# Patient Record
Sex: Female | Born: 1945 | Race: White | Hispanic: No | Marital: Married | Smoking: Never smoker
Health system: Southern US, Community
[De-identification: ages and names within clinical notes are randomized; demographics above are authoritative.]

## PROBLEM LIST (undated history)

## (undated) DIAGNOSIS — C801 Malignant (primary) neoplasm, unspecified: Secondary | ICD-10-CM

## (undated) DIAGNOSIS — E119 Type 2 diabetes mellitus without complications: Secondary | ICD-10-CM

## (undated) DIAGNOSIS — N289 Disorder of kidney and ureter, unspecified: Secondary | ICD-10-CM

## (undated) DIAGNOSIS — K7460 Unspecified cirrhosis of liver: Secondary | ICD-10-CM

## (undated) DIAGNOSIS — E7800 Pure hypercholesterolemia, unspecified: Secondary | ICD-10-CM

## (undated) DIAGNOSIS — M1990 Unspecified osteoarthritis, unspecified site: Secondary | ICD-10-CM

## (undated) DIAGNOSIS — I10 Essential (primary) hypertension: Secondary | ICD-10-CM

## (undated) DIAGNOSIS — I639 Cerebral infarction, unspecified: Secondary | ICD-10-CM

## (undated) DIAGNOSIS — G459 Transient cerebral ischemic attack, unspecified: Secondary | ICD-10-CM

## (undated) DIAGNOSIS — J189 Pneumonia, unspecified organism: Secondary | ICD-10-CM

## (undated) HISTORY — DX: Pure hypercholesterolemia, unspecified: E78.00

## (undated) HISTORY — DX: Essential (primary) hypertension: I10

## (undated) HISTORY — PX: ABDOMINAL HYSTERECTOMY: SHX81

## (undated) HISTORY — DX: Type 2 diabetes mellitus without complications: E11.9

## (undated) HISTORY — PX: OTHER SURGICAL HISTORY: SHX169

## (undated) HISTORY — PX: BREAST BIOPSY: SHX20

---

## 2007-03-26 HISTORY — PX: OTHER SURGICAL HISTORY: SHX169

## 2016-06-06 DIAGNOSIS — I10 Essential (primary) hypertension: Secondary | ICD-10-CM | POA: Diagnosis not present

## 2016-06-06 DIAGNOSIS — E7800 Pure hypercholesterolemia, unspecified: Secondary | ICD-10-CM | POA: Diagnosis not present

## 2016-06-06 DIAGNOSIS — E119 Type 2 diabetes mellitus without complications: Secondary | ICD-10-CM | POA: Diagnosis not present

## 2016-06-10 DIAGNOSIS — E119 Type 2 diabetes mellitus without complications: Secondary | ICD-10-CM | POA: Diagnosis not present

## 2016-06-10 DIAGNOSIS — I10 Essential (primary) hypertension: Secondary | ICD-10-CM | POA: Diagnosis not present

## 2016-06-25 DIAGNOSIS — H2513 Age-related nuclear cataract, bilateral: Secondary | ICD-10-CM | POA: Diagnosis not present

## 2016-06-25 DIAGNOSIS — H40033 Anatomical narrow angle, bilateral: Secondary | ICD-10-CM | POA: Diagnosis not present

## 2016-09-30 DIAGNOSIS — E7800 Pure hypercholesterolemia, unspecified: Secondary | ICD-10-CM | POA: Diagnosis not present

## 2016-09-30 DIAGNOSIS — E1129 Type 2 diabetes mellitus with other diabetic kidney complication: Secondary | ICD-10-CM | POA: Diagnosis not present

## 2016-09-30 DIAGNOSIS — E1329 Other specified diabetes mellitus with other diabetic kidney complication: Secondary | ICD-10-CM | POA: Diagnosis not present

## 2016-09-30 DIAGNOSIS — E119 Type 2 diabetes mellitus without complications: Secondary | ICD-10-CM | POA: Diagnosis not present

## 2016-09-30 DIAGNOSIS — I10 Essential (primary) hypertension: Secondary | ICD-10-CM | POA: Diagnosis not present

## 2016-10-02 DIAGNOSIS — Z0000 Encounter for general adult medical examination without abnormal findings: Secondary | ICD-10-CM | POA: Diagnosis not present

## 2016-10-02 DIAGNOSIS — R252 Cramp and spasm: Secondary | ICD-10-CM | POA: Diagnosis not present

## 2016-10-02 DIAGNOSIS — I10 Essential (primary) hypertension: Secondary | ICD-10-CM | POA: Diagnosis not present

## 2016-10-02 DIAGNOSIS — E7800 Pure hypercholesterolemia, unspecified: Secondary | ICD-10-CM | POA: Diagnosis not present

## 2016-10-02 DIAGNOSIS — E6609 Other obesity due to excess calories: Secondary | ICD-10-CM | POA: Diagnosis not present

## 2016-10-02 DIAGNOSIS — Z6837 Body mass index (BMI) 37.0-37.9, adult: Secondary | ICD-10-CM | POA: Diagnosis not present

## 2016-10-02 DIAGNOSIS — E119 Type 2 diabetes mellitus without complications: Secondary | ICD-10-CM | POA: Diagnosis not present

## 2017-01-23 DIAGNOSIS — E7800 Pure hypercholesterolemia, unspecified: Secondary | ICD-10-CM | POA: Diagnosis not present

## 2017-01-23 DIAGNOSIS — I10 Essential (primary) hypertension: Secondary | ICD-10-CM | POA: Diagnosis not present

## 2017-01-23 DIAGNOSIS — E1129 Type 2 diabetes mellitus with other diabetic kidney complication: Secondary | ICD-10-CM | POA: Diagnosis not present

## 2017-01-27 DIAGNOSIS — I10 Essential (primary) hypertension: Secondary | ICD-10-CM | POA: Diagnosis not present

## 2017-01-27 DIAGNOSIS — Z6837 Body mass index (BMI) 37.0-37.9, adult: Secondary | ICD-10-CM | POA: Diagnosis not present

## 2017-01-27 DIAGNOSIS — E6609 Other obesity due to excess calories: Secondary | ICD-10-CM | POA: Diagnosis not present

## 2017-01-27 DIAGNOSIS — E119 Type 2 diabetes mellitus without complications: Secondary | ICD-10-CM | POA: Diagnosis not present

## 2017-01-27 DIAGNOSIS — B356 Tinea cruris: Secondary | ICD-10-CM | POA: Diagnosis not present

## 2017-01-27 DIAGNOSIS — E7800 Pure hypercholesterolemia, unspecified: Secondary | ICD-10-CM | POA: Diagnosis not present

## 2017-01-27 DIAGNOSIS — R252 Cramp and spasm: Secondary | ICD-10-CM | POA: Diagnosis not present

## 2017-05-07 DIAGNOSIS — B356 Tinea cruris: Secondary | ICD-10-CM | POA: Diagnosis not present

## 2017-05-07 DIAGNOSIS — Z6837 Body mass index (BMI) 37.0-37.9, adult: Secondary | ICD-10-CM | POA: Diagnosis not present

## 2017-05-07 DIAGNOSIS — N950 Postmenopausal bleeding: Secondary | ICD-10-CM | POA: Diagnosis not present

## 2017-05-15 DIAGNOSIS — N950 Postmenopausal bleeding: Secondary | ICD-10-CM | POA: Diagnosis not present

## 2017-05-15 DIAGNOSIS — N898 Other specified noninflammatory disorders of vagina: Secondary | ICD-10-CM | POA: Diagnosis not present

## 2017-06-04 DIAGNOSIS — E6609 Other obesity due to excess calories: Secondary | ICD-10-CM | POA: Diagnosis not present

## 2017-06-04 DIAGNOSIS — Z6837 Body mass index (BMI) 37.0-37.9, adult: Secondary | ICD-10-CM | POA: Diagnosis not present

## 2017-06-04 DIAGNOSIS — R252 Cramp and spasm: Secondary | ICD-10-CM | POA: Diagnosis not present

## 2017-06-04 DIAGNOSIS — E7800 Pure hypercholesterolemia, unspecified: Secondary | ICD-10-CM | POA: Diagnosis not present

## 2017-06-04 DIAGNOSIS — E119 Type 2 diabetes mellitus without complications: Secondary | ICD-10-CM | POA: Diagnosis not present

## 2017-06-04 DIAGNOSIS — I10 Essential (primary) hypertension: Secondary | ICD-10-CM | POA: Diagnosis not present

## 2017-07-24 DIAGNOSIS — H40033 Anatomical narrow angle, bilateral: Secondary | ICD-10-CM | POA: Diagnosis not present

## 2017-07-24 DIAGNOSIS — H2513 Age-related nuclear cataract, bilateral: Secondary | ICD-10-CM | POA: Diagnosis not present

## 2017-08-01 DIAGNOSIS — Z6838 Body mass index (BMI) 38.0-38.9, adult: Secondary | ICD-10-CM | POA: Diagnosis not present

## 2017-08-01 DIAGNOSIS — R05 Cough: Secondary | ICD-10-CM | POA: Diagnosis not present

## 2017-08-14 DIAGNOSIS — R05 Cough: Secondary | ICD-10-CM | POA: Diagnosis not present

## 2017-08-14 DIAGNOSIS — Z6838 Body mass index (BMI) 38.0-38.9, adult: Secondary | ICD-10-CM | POA: Diagnosis not present

## 2017-10-07 DIAGNOSIS — I10 Essential (primary) hypertension: Secondary | ICD-10-CM | POA: Diagnosis not present

## 2017-10-07 DIAGNOSIS — E7801 Familial hypercholesterolemia: Secondary | ICD-10-CM | POA: Diagnosis not present

## 2017-10-07 DIAGNOSIS — E1129 Type 2 diabetes mellitus with other diabetic kidney complication: Secondary | ICD-10-CM | POA: Diagnosis not present

## 2017-10-09 DIAGNOSIS — R252 Cramp and spasm: Secondary | ICD-10-CM | POA: Diagnosis not present

## 2017-10-09 DIAGNOSIS — I10 Essential (primary) hypertension: Secondary | ICD-10-CM | POA: Diagnosis not present

## 2017-10-09 DIAGNOSIS — R945 Abnormal results of liver function studies: Secondary | ICD-10-CM | POA: Diagnosis not present

## 2017-10-09 DIAGNOSIS — Z1331 Encounter for screening for depression: Secondary | ICD-10-CM | POA: Diagnosis not present

## 2017-10-09 DIAGNOSIS — Z6837 Body mass index (BMI) 37.0-37.9, adult: Secondary | ICD-10-CM | POA: Diagnosis not present

## 2017-10-09 DIAGNOSIS — Z1389 Encounter for screening for other disorder: Secondary | ICD-10-CM | POA: Diagnosis not present

## 2017-10-09 DIAGNOSIS — E119 Type 2 diabetes mellitus without complications: Secondary | ICD-10-CM | POA: Diagnosis not present

## 2018-02-05 DIAGNOSIS — E7801 Familial hypercholesterolemia: Secondary | ICD-10-CM | POA: Diagnosis not present

## 2018-02-05 DIAGNOSIS — E7800 Pure hypercholesterolemia, unspecified: Secondary | ICD-10-CM | POA: Diagnosis not present

## 2018-02-05 DIAGNOSIS — I10 Essential (primary) hypertension: Secondary | ICD-10-CM | POA: Diagnosis not present

## 2018-02-05 DIAGNOSIS — E1329 Other specified diabetes mellitus with other diabetic kidney complication: Secondary | ICD-10-CM | POA: Diagnosis not present

## 2018-02-05 DIAGNOSIS — R945 Abnormal results of liver function studies: Secondary | ICD-10-CM | POA: Diagnosis not present

## 2018-02-09 DIAGNOSIS — R945 Abnormal results of liver function studies: Secondary | ICD-10-CM | POA: Diagnosis not present

## 2018-02-09 DIAGNOSIS — E7800 Pure hypercholesterolemia, unspecified: Secondary | ICD-10-CM | POA: Diagnosis not present

## 2018-02-09 DIAGNOSIS — J069 Acute upper respiratory infection, unspecified: Secondary | ICD-10-CM | POA: Diagnosis not present

## 2018-02-09 DIAGNOSIS — R252 Cramp and spasm: Secondary | ICD-10-CM | POA: Diagnosis not present

## 2018-02-09 DIAGNOSIS — E119 Type 2 diabetes mellitus without complications: Secondary | ICD-10-CM | POA: Diagnosis not present

## 2018-02-09 DIAGNOSIS — I10 Essential (primary) hypertension: Secondary | ICD-10-CM | POA: Diagnosis not present

## 2018-02-09 DIAGNOSIS — Z6837 Body mass index (BMI) 37.0-37.9, adult: Secondary | ICD-10-CM | POA: Diagnosis not present

## 2018-02-09 DIAGNOSIS — R609 Edema, unspecified: Secondary | ICD-10-CM | POA: Diagnosis not present

## 2018-04-08 DIAGNOSIS — C541 Malignant neoplasm of endometrium: Secondary | ICD-10-CM | POA: Diagnosis not present

## 2018-04-08 DIAGNOSIS — N950 Postmenopausal bleeding: Secondary | ICD-10-CM | POA: Diagnosis not present

## 2018-04-17 DIAGNOSIS — R9389 Abnormal findings on diagnostic imaging of other specified body structures: Secondary | ICD-10-CM | POA: Diagnosis not present

## 2018-04-17 DIAGNOSIS — N950 Postmenopausal bleeding: Secondary | ICD-10-CM | POA: Diagnosis not present

## 2018-04-17 DIAGNOSIS — C541 Malignant neoplasm of endometrium: Secondary | ICD-10-CM | POA: Diagnosis not present

## 2018-04-20 ENCOUNTER — Telehealth: Payer: Self-pay | Admitting: *Deleted

## 2018-04-20 NOTE — Progress Notes (Signed)
Updated med list and history per dr office.

## 2018-04-20 NOTE — Telephone Encounter (Signed)
Called and spoke with the patient regarding a new patient appt. Patient scheduled for this Friday at 9:30am with Dr. Denman George

## 2018-04-22 DIAGNOSIS — R9389 Abnormal findings on diagnostic imaging of other specified body structures: Secondary | ICD-10-CM | POA: Diagnosis not present

## 2018-04-22 DIAGNOSIS — K7460 Unspecified cirrhosis of liver: Secondary | ICD-10-CM | POA: Diagnosis not present

## 2018-04-22 DIAGNOSIS — K8020 Calculus of gallbladder without cholecystitis without obstruction: Secondary | ICD-10-CM | POA: Diagnosis not present

## 2018-04-22 DIAGNOSIS — I517 Cardiomegaly: Secondary | ICD-10-CM | POA: Diagnosis not present

## 2018-04-22 DIAGNOSIS — C541 Malignant neoplasm of endometrium: Secondary | ICD-10-CM | POA: Diagnosis not present

## 2018-04-22 DIAGNOSIS — I700 Atherosclerosis of aorta: Secondary | ICD-10-CM | POA: Diagnosis not present

## 2018-04-23 ENCOUNTER — Telehealth: Payer: Self-pay | Admitting: *Deleted

## 2018-04-23 DIAGNOSIS — Z6837 Body mass index (BMI) 37.0-37.9, adult: Secondary | ICD-10-CM | POA: Diagnosis not present

## 2018-04-23 DIAGNOSIS — J111 Influenza due to unidentified influenza virus with other respiratory manifestations: Secondary | ICD-10-CM | POA: Diagnosis not present

## 2018-04-23 NOTE — Telephone Encounter (Signed)
Returned the patient call and reschedule her appt from tomorrow 1/31 to 2/4. Patient is sick

## 2018-04-24 ENCOUNTER — Inpatient Hospital Stay: Payer: Self-pay | Admitting: Gynecologic Oncology

## 2018-04-28 ENCOUNTER — Inpatient Hospital Stay: Payer: PPO | Attending: Gynecologic Oncology | Admitting: Gynecologic Oncology

## 2018-04-28 ENCOUNTER — Encounter: Payer: Self-pay | Admitting: Gynecologic Oncology

## 2018-04-28 VITALS — BP 143/65 | HR 76 | Temp 98.1°F | Resp 18 | Ht 63.0 in | Wt 209.0 lb

## 2018-04-28 DIAGNOSIS — C541 Malignant neoplasm of endometrium: Secondary | ICD-10-CM

## 2018-04-28 NOTE — H&P (View-Only) (Signed)
Consult Note: Gyn-Onc  Consult was requested by Dr. Adah Perl for the evaluation of [name] 73 y.o. female  CC:  Chief Complaint  Patient presents with  . Endometrial cancer [facility])    Assessment/Plan:  Ms. COUNTESS BIEBEL  is a 73 y.o.  year old with grade 1 endometrioid endometrial cancer and obesity (BMI 37).  A detailed discussion was held with the patient and her family with regard to to her endometrial cancer diagnosis. We discussed the standard management options for uterine cancer which includes surgery followed possibly by adjuvant therapy depending on the results of surgery. The options for surgical management include a hysterectomy and removal of the tubes and ovaries possibly with removal of pelvic and para-aortic lymph nodes.If feasible, a minimally invasive approach including a robotic hysterectomy or laparoscopic hysterectomy have benefits including shorter hospital stay, recovery time and better wound healing than with open surgery. The patient has been counseled about these surgical options and the risks of surgery in general including infection, bleeding, damage to surrounding structures (including bowel, bladder, ureters, nerves or vessels), and the postoperative risks of PE/ DVT, and lymphedema. I extensively reviewed the additional risks of robotic hysterectomy including possible need for conversion to open laparotomy.  I discussed positioning during surgery of trendelenberg and risks of minor facial swelling and care we take in preoperative positioning.  After counseling and consideration of her options, she desires to proceed with robotic assisted total hysterectomy with bilateral sapingo-oophorectomy and SLN biopsy.   She will be seen by anesthesia for preoperative clearance and discussion of postoperative pain management.  She was given the opportunity to ask questions, which were answered to her satisfaction, and she is agreement with the above mentioned plan of  care.  She will stop her ASA preoperatively. Surgery is scheduled for approximately 10 days.   HPI: Ms Marketta Valadez is a 73 year old P2 who is seen in consultation at the request of Dr Adah Perl for a grade I endometrial cancer.  The patient reports that she developed postmenopausal bleeding in the spring 2019.  She was seen and evaluated by Dr. Adah Perl who upon vaginal examination felt it was bleeding from her vaginal lesion.  The bleeding got better but then again became worse in December 2019 and she she was reevaluated by Dr. Adah Perl.  At that time a transvaginal ultrasound scan was performed in the office on April 17, 2018.  This revealed a uterus measuring 10.2 x 4.9 x 6.2 cm.  The endometrium was thickened at 26 mm.  There were no focal abnormalities otherwise noted.  The right and left ovaries were grossly normal.  She then underwent an endometrial biopsy on 04/09/18 which revealed an endometrioid adenocarcinoma FIGO grade 1.  The patient reports she had CT imaging performed at [facility] in [place] however we do not have the report at this time for this.  She was reported that it was normal.  The patient has a past medical history significant for obesity (BMI 37), hypertension, hypercholesterolemia, and type 2 diabetes mellitus on metformin.  Her only prior surgery was a hysteroscopy 11 years ago.  She has a family history significant for mother with lung cancer.  Her diabetes is fairly well controlled as her last hemoglobin A1c in November 2019 was 5.9%.  It is managed by her primary care doctor Dr. Consuello Masse.   Current Meds:  Outpatient Encounter Medications as of 04/28/2018  Medication Sig  . aspirin EC 81 MG tablet Take 81  mg by mouth daily.  Marland Kitchen guaiFENesin (MUCINEX) 600 MG 12 hr tablet Take 600 mg by mouth 2 (two) times daily.  Marland Kitchen losartan (COZAAR) 25 MG tablet Take 25 mg by mouth daily.  . metFORMIN (GLUCOPHAGE-XR) 500 MG 24 hr tablet Take 500 mg by mouth daily with breakfast.  .  simvastatin (ZOCOR) 20 MG tablet Take 20 mg by mouth daily.   No facility-administered encounter medications on file as of 04/28/2018.     Allergy: No Known Allergies  Social Hx:   Social History   Socioeconomic History  . Marital status: Married    Spouse name: Not on file  . Number of children: Not on file  . Years of education: Not on file  . Highest education level: Not on file  Occupational History  . Not on file  Social Needs  . Financial resource strain: Not on file  . Food insecurity:    Worry: Not on file    Inability: Not on file  . Transportation needs:    Medical: Not on file    Non-medical: Not on file  Tobacco Use  . Smoking status: Never Smoker  . Smokeless tobacco: Never Used  Substance and Sexual Activity  . Alcohol use: Never    Frequency: Never  . Drug use: Never  . Sexual activity: Not Currently  Lifestyle  . Physical activity:    Days per week: Not on file    Minutes per session: Not on file  . Stress: Not on file  Relationships  . Social connections:    Talks on phone: Not on file    Gets together: Not on file    Attends religious service: Not on file    Active member of club or organization: Not on file    Attends meetings of clubs or organizations: Not on file    Relationship status: Not on file  . Intimate partner violence:    Fear of current or ex partner: Not on file    Emotionally abused: Not on file    Physically abused: Not on file    Forced sexual activity: Not on file  Other Topics Concern  . Not on file  Social History Narrative  . Not on file    Past Surgical Hx:  Past Surgical History:  Procedure Laterality Date  . BREAST BIOPSY Right    ~15 years ago-2005  . OTHER SURGICAL HISTORY  2009   Uterine Polyp removal    Past Medical Hx:  Past Medical History:  Diagnosis Date  . Diabetes mellitus without complication ([place])   . High cholesterol   . Hypertension     Past Gynecological History:  See HPI No LMP  recorded.  Family Hx:  Family History  Problem Relation Age of Onset  . Lung cancer Mother   . Hypertension Father   . Pancreatic cancer Brother     Review of Systems:  Constitutional  Feels well,    ENT Normal appearing ears and nares bilaterally Skin/Breast  No rash, sores, jaundice, itching, dryness Cardiovascular  No chest pain, shortness of breath, or edema  Pulmonary  No cough or wheeze.  Gastro Intestinal  No nausea, vomitting, or diarrhoea. No bright red blood per rectum, no abdominal pain, change in bowel movement, or constipation.  Genito Urinary  No frequency, urgency, dysuria, + postmenopausal bleeding Musculo Skeletal  No myalgia, arthralgia, joint swelling or pain  Neurologic  No weakness, numbness, change in gait,  Psychology  No depression, anxiety, insomnia.  Vitals:  Blood pressure (!) 143/65, pulse 76, temperature 98.1 F (36.7 C), resp. rate 18, height 5\' 3"  (1.6 m), weight 209 lb (94.8 kg), SpO2 96 %.  Physical Exam: WD in NAD Neck  Supple NROM, without any enlargements.  Lymph Node Survey No cervical supraclavicular or inguinal adenopathy Cardiovascular  Pulse normal rate, regularity and rhythm. S1 and S2 normal.  Lungs  Clear to auscultation bilateraly, without wheezes/crackles/rhonchi. Good air movement.  Skin  No rash/lesions/breakdown  Psychiatry  Alert and oriented to person, place, and time  Abdomen  Normoactive bowel sounds, abdomen soft, non-tender and obese without evidence of hernia.  Back No CVA tenderness Genito Urinary  Vulva/vagina: Normal external female genitalia.   No lesions. No discharge or bleeding.  Bladder/urethra:  No lesions or masses, well supported bladder  Vagina: grossly normal  Cervix: Normal appearing, no lesions.  Uterus:  Small, mobile, no parametrial involvement or nodularity.  Adnexa: no palpable masses. Rectal  deferred Extremities  No bilateral cyanosis, clubbing or edema.   Thereasa Solo, MD   04/28/2018, 5:19 PM

## 2018-04-28 NOTE — Progress Notes (Signed)
Consult Note: Gyn-Onc  Consult was requested by Dr. Adah Perl for the evaluation of Jordan Le 73 y.o. female  CC:  Chief Complaint  Patient presents with  . Endometrial cancer [facility])    Assessment/Plan:  Ms. Jordan Le  is a 73 y.o.  year old with grade 1 endometrioid endometrial cancer and obesity (BMI 37).  A detailed discussion was held with the patient and her family with regard to to her endometrial cancer diagnosis. We discussed the standard management options for uterine cancer which includes surgery followed possibly by adjuvant therapy depending on the results of surgery. The options for surgical management include a hysterectomy and removal of the tubes and ovaries possibly with removal of pelvic and para-aortic lymph nodes.If feasible, a minimally invasive approach including a robotic hysterectomy or laparoscopic hysterectomy have benefits including shorter hospital stay, recovery time and better wound healing than with open surgery. The patient has been counseled about these surgical options and the risks of surgery in general including infection, bleeding, damage to surrounding structures (including bowel, bladder, ureters, nerves or vessels), and the postoperative risks of PE/ DVT, and lymphedema. I extensively reviewed the additional risks of robotic hysterectomy including possible need for conversion to open laparotomy.  I discussed positioning during surgery of trendelenberg and risks of minor facial swelling and care we take in preoperative positioning.  After counseling and consideration of her options, she desires to proceed with robotic assisted total hysterectomy with bilateral sapingo-oophorectomy and SLN biopsy.   She will be seen by anesthesia for preoperative clearance and discussion of postoperative pain management.  She was given the opportunity to ask questions, which were answered to her satisfaction, and she is agreement with the above mentioned plan of  care.  She will stop her ASA preoperatively. Surgery is scheduled for approximately 10 days.   HPI: Ms Jordan Le is a 73 year old P2 who is seen in consultation at the request of Dr Adah Perl for a grade I endometrial cancer.  The patient reports that she developed postmenopausal bleeding in the spring 2019.  She was seen and evaluated by Dr. Adah Perl who upon vaginal examination felt it was bleeding from her vaginal lesion.  The bleeding got better but then again became worse in December 2019 and she she was reevaluated by Dr. Adah Perl.  At that time a transvaginal ultrasound scan was performed in the office on April 17, 2018.  This revealed a uterus measuring 10.2 x 4.9 x 6.2 cm.  The endometrium was thickened at 26 mm.  There were no focal abnormalities otherwise noted.  The right and left ovaries were grossly normal.  She then underwent an endometrial biopsy on 04/09/18 which revealed an endometrioid adenocarcinoma FIGO grade 1.  The patient reports she had CT imaging performed at [facility] in [place] however we do not have the report at this time for this.  She was reported that it was normal.  The patient has a past medical history significant for obesity (BMI 37), hypertension, hypercholesterolemia, and type 2 diabetes mellitus on metformin.  Her only prior surgery was a hysteroscopy 11 years ago.  She has a family history significant for mother with lung cancer.  Her diabetes is fairly well controlled as her last hemoglobin A1c in November 2019 was 5.9%.  It is managed by her primary care doctor Dr. Consuello Masse.   Current Meds:  Outpatient Encounter Medications as of 04/28/2018  Medication Sig  . aspirin EC 81 MG tablet Take 81  mg by mouth daily.  Marland Kitchen guaiFENesin (MUCINEX) 600 MG 12 hr tablet Take 600 mg by mouth 2 (two) times daily.  Marland Kitchen losartan (COZAAR) 25 MG tablet Take 25 mg by mouth daily.  . metFORMIN (GLUCOPHAGE-XR) 500 MG 24 hr tablet Take 500 mg by mouth daily with breakfast.  .  simvastatin (ZOCOR) 20 MG tablet Take 20 mg by mouth daily.   No facility-administered encounter medications on file as of 04/28/2018.     Allergy: No Known Allergies  Social Hx:   Social History   Socioeconomic History  . Marital status: Married    Spouse name: Not on file  . Number of children: Not on file  . Years of education: Not on file  . Highest education level: Not on file  Occupational History  . Not on file  Social Needs  . Financial resource strain: Not on file  . Food insecurity:    Worry: Not on file    Inability: Not on file  . Transportation needs:    Medical: Not on file    Non-medical: Not on file  Tobacco Use  . Smoking status: Never Smoker  . Smokeless tobacco: Never Used  Substance and Sexual Activity  . Alcohol use: Never    Frequency: Never  . Drug use: Never  . Sexual activity: Not Currently  Lifestyle  . Physical activity:    Days per week: Not on file    Minutes per session: Not on file  . Stress: Not on file  Relationships  . Social connections:    Talks on phone: Not on file    Gets together: Not on file    Attends religious service: Not on file    Active member of club or organization: Not on file    Attends meetings of clubs or organizations: Not on file    Relationship status: Not on file  . Intimate partner violence:    Fear of current or ex partner: Not on file    Emotionally abused: Not on file    Physically abused: Not on file    Forced sexual activity: Not on file  Other Topics Concern  . Not on file  Social History Narrative  . Not on file    Past Surgical Hx:  Past Surgical History:  Procedure Laterality Date  . BREAST BIOPSY Right    ~15 years ago-2005  . OTHER SURGICAL HISTORY  2009   Uterine Polyp removal    Past Medical Hx:  Past Medical History:  Diagnosis Date  . Diabetes mellitus without complication ([place])   . High cholesterol   . Hypertension     Past Gynecological History:  See HPI No LMP  recorded.  Family Hx:  Family History  Problem Relation Age of Onset  . Lung cancer Mother   . Hypertension Father   . Pancreatic cancer Brother     Review of Systems:  Constitutional  Feels well,    ENT Normal appearing ears and nares bilaterally Skin/Breast  No rash, sores, jaundice, itching, dryness Cardiovascular  No chest pain, shortness of breath, or edema  Pulmonary  No cough or wheeze.  Gastro Intestinal  No nausea, vomitting, or diarrhoea. No bright red blood per rectum, no abdominal pain, change in bowel movement, or constipation.  Genito Urinary  No frequency, urgency, dysuria, + postmenopausal bleeding Musculo Skeletal  No myalgia, arthralgia, joint swelling or pain  Neurologic  No weakness, numbness, change in gait,  Psychology  No depression, anxiety, insomnia.  Vitals:  Blood pressure (!) 143/65, pulse 76, temperature 98.1 F (36.7 C), resp. rate 18, height 5\' 3"  (1.6 m), weight 209 lb (94.8 kg), SpO2 96 %.  Physical Exam: WD in NAD Neck  Supple NROM, without any enlargements.  Lymph Node Survey No cervical supraclavicular or inguinal adenopathy Cardiovascular  Pulse normal rate, regularity and rhythm. S1 and S2 normal.  Lungs  Clear to auscultation bilateraly, without wheezes/crackles/rhonchi. Good air movement.  Skin  No rash/lesions/breakdown  Psychiatry  Alert and oriented to person, place, and time  Abdomen  Normoactive bowel sounds, abdomen soft, non-tender and obese without evidence of hernia.  Back No CVA tenderness Genito Urinary  Vulva/vagina: Normal external female genitalia.   No lesions. No discharge or bleeding.  Bladder/urethra:  No lesions or masses, well supported bladder  Vagina: grossly normal  Cervix: Normal appearing, no lesions.  Uterus:  Small, mobile, no parametrial involvement or nodularity.  Adnexa: no palpable masses. Rectal  deferred Extremities  No bilateral cyanosis, clubbing or edema.   Thereasa Solo, MD   04/28/2018, 5:19 PM

## 2018-04-28 NOTE — Patient Instructions (Signed)
Preparing for your Surgery  Plan for surgery on May 07, 2018 with Dr. Everitt Amber at [facility] will be scheduled for a robotic assisted total hysterectomy, bilateral salpingo-oophorectomy, sentinel lymph node biopsy.   Pre-operative Testing -You will receive a phone call from presurgical testing at [facility] to arrange for a pre-operative testing appointment before your surgery.  This appointment normally occurs one to two weeks before your scheduled surgery.   -Bring your insurance card, copy of an advanced directive if applicable, medication list  -At that visit, you will be asked to sign a consent for a possible blood transfusion in case a transfusion becomes necessary during surgery.  The need for a blood transfusion is rare but having consent is a necessary part of your care.     -STOP ASPIRIN NOW. You should not be taking blood thinners or aspirin at least ten days prior to surgery unless instructed by your surgeon.  Day Before Surgery at [place] will be asked to take in a light diet the day before surgery.  Avoid carbonated beverages.  You will be advised to have nothing to eat or drink after midnight the evening before.    Eat a light diet the day before surgery.  Examples including soups, broths, toast, yogurt, mashed potatoes.  Things to avoid include carbonated beverages (fizzy beverages), raw fruits and raw vegetables, or beans.   If your bowels are filled with gas, your surgeon will have difficulty visualizing your pelvic organs which increases your surgical risks.  Your role in recovery Your role is to become active as soon as directed by your doctor, while still giving yourself time to heal.  Rest when you feel tired. You will be asked to do the following in order to speed your recovery:  - Cough and breathe deeply. This helps toclear and expand your lungs and can prevent pneumonia. You may be given a spirometer to practice deep  breathing. A staff member will show you how to use the spirometer. - Do mild physical activity. Walking or moving your legs help your circulation and body functions return to normal. A staff member will help you when you try to walk and will provide you with simple exercises. Do not try to get up or walk alone the first time. - Actively manage your pain. Managing your pain lets you move in comfort. We will ask you to rate your pain on a scale of zero to 10. It is your responsibility to tell your doctor or nurse where and how much you hurt so your pain can be treated.  Special Considerations -If you are diabetic, you may be placed on insulin after surgery to have closer control over your blood sugars to promote healing and recovery.  This does not mean that you will be discharged on insulin.  If applicable, your oral antidiabetics will be resumed when you are tolerating a solid diet.  -Your final pathology results from surgery should be available around one week after surgery and the results will be relayed to you when available.  -Dr. Lahoma Crocker is the Surgeon that assists your GYN Oncologist with surgery.  The next day after your surgery you will either see Dr. Everitt Amber or Dr. Lahoma Crocker.  -FMLA forms can be faxed to [phone] and please allow 5-7 business days for completion.   Blood Transfusion Information WHAT IS A BLOOD TRANSFUSION? A transfusion is the replacement of blood or some of its parts. Blood is made  up of multiple cells which provide different functions.  Red blood cells carry oxygen and are used for blood loss replacement.  White blood cells fight against infection.  Platelets control bleeding.  Plasma helps clot blood.  Other blood products are available for specialized needs, such as hemophilia or other clotting disorders. BEFORE THE TRANSFUSION  Who gives blood for transfusions?   You may be able to donate blood to be used at a later date on  yourself (autologous donation).  Relatives can be asked to donate blood. This is generally not any safer than if you have received blood from a stranger. The same precautions are taken to ensure safety when a relative's blood is donated.  Healthy volunteers who are fully evaluated to make sure their blood is safe. This is blood bank blood. Transfusion therapy is the safest it has ever been in the practice of medicine. Before blood is taken from a donor, a complete history is taken to make sure that person has no history of diseases nor engages in risky social behavior (examples are intravenous drug use or sexual activity with multiple partners). The donor's travel history is screened to minimize risk of transmitting infections, such as malaria. The donated blood is tested for signs of infectious diseases, such as HIV and hepatitis. The blood is then tested to be sure it is compatible with you in order to minimize the chance of a transfusion reaction. If you or a relative donates blood, this is often done in anticipation of surgery and is not appropriate for emergency situations. It takes many days to process the donated blood. RISKS AND COMPLICATIONS Although transfusion therapy is very safe and saves many lives, the main dangers of transfusion include:   Getting an infectious disease.  Developing a transfusion reaction. This is an allergic reaction to something in the blood you were given. Every precaution is taken to prevent this. The decision to have a blood transfusion has been considered carefully by your caregiver before blood is given. Blood is not given unless the benefits outweigh the risks.

## 2018-05-04 NOTE — Patient Instructions (Signed)
Jordan Le  05/04/2018   Your procedure is scheduled on: 05-07-18   Report to [facility] Main  Entrance              Report to admitting at                  0800 AM    Call this number if you have problems the morning of surgery [phone]    Remember: Eat a light diet the day before surgery.  Examples including soups, broths, toast, yogurt, mashed potatoes.  Things to avoid include carbonated beverages (fizzy beverages), raw fruits and raw vegetables, or beans.   If your bowels are filled with gas, your surgeon will have difficulty visualizing your pelvic organs which increases your surgical risks. Do not eat food or drink liquids :After Midnight.   BRUSH YOUR TEETH MORNING OF SURGERY AND RINSE YOUR MOUTH OUT, NO CHEWING GUM CANDY OR MINTS.     Take these medicines the morning of surgery with A SIP OF WATER: none DO NOT TAKE ANY DIABETIC MEDICATIONS DAY OF YOUR SURGERY                               You may not have any metal on your body including hair pins and              piercings  Do not wear jewelry, make-up, lotions, powders or perfumes, deodorant             Do not wear nail polish.  Do not shave  48 hours prior to surgery.         Do not bring valuables to the hospital. [place].  Contacts, dentures or bridgework may not be worn into surgery.      Patients discharged the day of surgery will not be allowed to drive home. IF YOU ARE HAVING SURGERY AND GOING HOME THE SAME DAY, YOU MUST HAVE AN ADULT TO DRIVE YOU HOME AND BE WITH YOU FOR 24 HOURS. YOU MAY GO HOME BY TAXI OR UBER OR ORTHERWISE, BUT AN ADULT MUST ACCOMPANY YOU HOME AND STAY WITH YOU FOR 24 HOURS.  Name and phone number of your driver:  Special Instructions: N/A              Please read over the following fact sheets you were given: _____________________________________________________________________             [facility]  - Preparing for Surgery Before surgery, you can play an important role.  Because skin is not sterile, your skin needs to be as free of germs as possible.  You can reduce the number of germs on your skin by washing with CHG (chlorahexidine gluconate) soap before surgery.  CHG is an antiseptic cleaner which kills germs and bonds with the skin to continue killing germs even after washing. Please DO NOT use if you have an allergy to CHG or antibacterial soaps.  If your skin becomes reddened/irritated stop using the CHG and inform your nurse when you arrive at Short Stay. Do not shave (including legs and underarms) for at least 48 hours prior to the first CHG shower.  You may shave your face/neck. Please follow these instructions carefully:  1.  Shower with CHG Soap the night before surgery and the  morning of Surgery.  2.  If you choose to wash your hair, wash your hair first as usual with your  normal  shampoo.  3.  After you shampoo, rinse your hair and body thoroughly to remove the  shampoo.                           4.  Use CHG as you would any other liquid soap.  You can apply chg directly  to the skin and wash                       Gently with a scrungie or clean washcloth.  5.  Apply the CHG Soap to your body ONLY FROM THE NECK DOWN.   Do not use on face/ open                           Wound or open sores. Avoid contact with eyes, ears mouth and genitals (private parts).                       Wash face,  Genitals (private parts) with your normal soap.             6.  Wash thoroughly, paying special attention to the area where your surgery  will be performed.  7.  Thoroughly rinse your body with warm water from the neck down.  8.  DO NOT shower/wash with your normal soap after using and rinsing off  the CHG Soap.                9.  Pat yourself dry with a clean towel.            10.  Wear clean pajamas.            11.  Place clean sheets on your bed the night of your first shower and do not  sleep  with pets. Day of Surgery : Do not apply any lotions/deodorants the morning of surgery.  Please wear clean clothes to the hospital/surgery center.  FAILURE TO FOLLOW THESE INSTRUCTIONS MAY RESULT IN THE CANCELLATION OF YOUR SURGERY PATIENT SIGNATURE_________________________________  NURSE SIGNATURE__________________________________  ________________________________________________________________________  WHAT IS A BLOOD TRANSFUSION? Blood Transfusion Information  A transfusion is the replacement of blood or some of its parts. Blood is made up of multiple cells which provide different functions.  Red blood cells carry oxygen and are used for blood loss replacement.  White blood cells fight against infection.  Platelets control bleeding.  Plasma helps clot blood.  Other blood products are available for specialized needs, such as hemophilia or other clotting disorders. BEFORE THE TRANSFUSION  Who gives blood for transfusions?   Healthy volunteers who are fully evaluated to make sure their blood is safe. This is blood bank blood. Transfusion therapy is the safest it has ever been in the practice of medicine. Before blood is taken from a donor, a complete history is taken to make sure that person has no history of diseases nor engages in risky social behavior (examples are intravenous drug use or sexual activity with multiple partners). The donor's travel history is screened to minimize risk of transmitting infections, such as malaria. The donated blood is tested for signs of infectious diseases, such as HIV and hepatitis. The blood is then tested to  be sure it is compatible with you in order to minimize the chance of a transfusion reaction. If you or a relative donates blood, this is often done in anticipation of surgery and is not appropriate for emergency situations. It takes many days to process the donated blood. RISKS AND COMPLICATIONS Although transfusion therapy is very safe and  saves many lives, the main dangers of transfusion include:   Getting an infectious disease.  Developing a transfusion reaction. This is an allergic reaction to something in the blood you were given. Every precaution is taken to prevent this. The decision to have a blood transfusion has been considered carefully by your caregiver before blood is given. Blood is not given unless the benefits outweigh the risks. AFTER THE TRANSFUSION  Right after receiving a blood transfusion, you will usually feel much better and more energetic. This is especially true if your red blood cells have gotten low (anemic). The transfusion raises the level of the red blood cells which carry oxygen, and this usually causes an energy increase.  The nurse administering the transfusion will monitor you carefully for complications. HOME CARE INSTRUCTIONS  No special instructions are needed after a transfusion. You may find your energy is better. Speak with your caregiver about any limitations on activity for underlying diseases you may have. SEEK MEDICAL CARE IF:   Your condition is not improving after your transfusion.  You develop redness or irritation at the intravenous (IV) site. SEEK IMMEDIATE MEDICAL CARE IF:  Any of the following symptoms occur over the next 12 hours:  Shaking chills.  You have a temperature by mouth above 102 F (38.9 C), not controlled by medicine.  Chest, back, or muscle pain.  People around you feel you are not acting correctly or are confused.  Shortness of breath or difficulty breathing.  Dizziness and fainting.  You get a rash or develop hives.  You have a decrease in urine output.  Your urine turns a dark color or changes to pink, red, or brown. Any of the following symptoms occur over the next 10 days:  You have a temperature by mouth above 102 F (38.9 C), not controlled by medicine.  Shortness of breath.  Weakness after normal activity.  The white part of the eye  turns yellow (jaundice).  You have a decrease in the amount of urine or are urinating less often.  Your urine turns a dark color or changes to pink, red, or brown. Document Released: 03/08/2000 Document Revised: 06/03/2011 Document Reviewed: 10/26/2007 ExitCare Patient Information 2014 [place].  _______________________________________________________________________  Incentive Spirometer  An incentive spirometer is a tool that can help keep your lungs clear and active. This tool measures how well you are filling your lungs with each breath. Taking long deep breaths may help reverse or decrease the chance of developing breathing (pulmonary) problems (especially infection) following:  A long period of time when you are unable to move or be active. BEFORE THE PROCEDURE   If the spirometer includes an indicator to show your best effort, your nurse or respiratory therapist will set it to a desired goal.  If possible, sit up straight or lean slightly forward. Try not to slouch.  Hold the incentive spirometer in an upright position. INSTRUCTIONS FOR USE  1. Sit on the edge of your bed if possible, or sit up as far as you can in bed or on a chair. 2. Hold the incentive spirometer in an upright position. 3. Breathe out normally. 4. Place the  mouthpiece in your mouth and seal your lips tightly around it. 5. Breathe in slowly and as deeply as possible, raising the piston or the ball toward the top of the column. 6. Hold your breath for 3-5 seconds or for as long as possible. Allow the piston or ball to fall to the bottom of the column. 7. Remove the mouthpiece from your mouth and breathe out normally. 8. Rest for a few seconds and repeat Steps 1 through 7 at least 10 times every 1-2 hours when you are awake. Take your time and take a few normal breaths between deep breaths. 9. The spirometer may include an indicator to show your best effort. Use the indicator as a goal to work toward  during each repetition. 10. After each set of 10 deep breaths, practice coughing to be sure your lungs are clear. If you have an incision (the cut made at the time of surgery), support your incision when coughing by placing a pillow or rolled up towels firmly against it. Once you are able to get out of bed, walk around indoors and cough well. You may stop using the incentive spirometer when instructed by your caregiver.  RISKS AND COMPLICATIONS  Take your time so you do not get dizzy or light-headed.  If you are in pain, you may need to take or ask for pain medication before doing incentive spirometry. It is harder to take a deep breath if you are having pain. AFTER USE  Rest and breathe slowly and easily.  It can be helpful to keep track of a log of your progress. Your caregiver can provide you with a simple table to help with this. If you are using the spirometer at home, follow these instructions: [place] IF:   You are having difficultly using the spirometer.  You have trouble using the spirometer as often as instructed.  Your pain medication is not giving enough relief while using the spirometer.  You develop fever of 100.5 F (38.1 C) or higher. SEEK IMMEDIATE MEDICAL CARE IF:   You cough up bloody sputum that had not been present before.  You develop fever of 102 F (38.9 C) or greater.  You develop worsening pain at or near the incision site. MAKE SURE YOU:   Understand these instructions.  Will watch your condition.  Will get help right away if you are not doing well or get worse. Document Released: 07/22/2006 Document Revised: 06/03/2011 Document Reviewed: 09/22/2006 [facility] Patient Information 2014 [place], [place].   ________________________________________________________________________

## 2018-05-05 ENCOUNTER — Encounter (HOSPITAL_COMMUNITY)
Admission: RE | Admit: 2018-05-05 | Discharge: 2018-05-05 | Disposition: A | Discharge status: Home / Self Care | Payer: PPO | Source: Ambulatory Visit | Attending: Gynecologic Oncology | Admitting: Gynecologic Oncology

## 2018-05-05 ENCOUNTER — Other Ambulatory Visit: Payer: Self-pay

## 2018-05-05 ENCOUNTER — Other Ambulatory Visit (HOSPITAL_COMMUNITY): Payer: PPO

## 2018-05-05 ENCOUNTER — Encounter (HOSPITAL_COMMUNITY): Payer: Self-pay

## 2018-05-05 DIAGNOSIS — Z01818 Encounter for other preprocedural examination: Secondary | ICD-10-CM

## 2018-05-05 DIAGNOSIS — E7800 Pure hypercholesterolemia, unspecified: Secondary | ICD-10-CM | POA: Diagnosis not present

## 2018-05-05 DIAGNOSIS — C541 Malignant neoplasm of endometrium: Secondary | ICD-10-CM | POA: Diagnosis not present

## 2018-05-05 DIAGNOSIS — Z7982 Long term (current) use of aspirin: Secondary | ICD-10-CM | POA: Diagnosis not present

## 2018-05-05 DIAGNOSIS — N898 Other specified noninflammatory disorders of vagina: Secondary | ICD-10-CM | POA: Diagnosis not present

## 2018-05-05 DIAGNOSIS — I10 Essential (primary) hypertension: Secondary | ICD-10-CM | POA: Diagnosis not present

## 2018-05-05 DIAGNOSIS — Z79899 Other long term (current) drug therapy: Secondary | ICD-10-CM | POA: Diagnosis not present

## 2018-05-05 DIAGNOSIS — Z801 Family history of malignant neoplasm of trachea, bronchus and lung: Secondary | ICD-10-CM | POA: Diagnosis not present

## 2018-05-05 DIAGNOSIS — Z6837 Body mass index (BMI) 37.0-37.9, adult: Secondary | ICD-10-CM | POA: Diagnosis not present

## 2018-05-05 DIAGNOSIS — E669 Obesity, unspecified: Secondary | ICD-10-CM | POA: Diagnosis not present

## 2018-05-05 DIAGNOSIS — N800 Endometriosis of uterus: Secondary | ICD-10-CM | POA: Diagnosis not present

## 2018-05-05 DIAGNOSIS — Z7984 Long term (current) use of oral hypoglycemic drugs: Secondary | ICD-10-CM | POA: Diagnosis not present

## 2018-05-05 DIAGNOSIS — E119 Type 2 diabetes mellitus without complications: Secondary | ICD-10-CM | POA: Diagnosis not present

## 2018-05-05 HISTORY — DX: Cerebral infarction, unspecified: I63.9

## 2018-05-05 HISTORY — DX: Malignant (primary) neoplasm, unspecified: C80.1

## 2018-05-05 HISTORY — DX: Transient cerebral ischemic attack, unspecified: G45.9

## 2018-05-05 HISTORY — DX: Pneumonia, unspecified organism: J18.9

## 2018-05-05 HISTORY — DX: Unspecified osteoarthritis, unspecified site: M19.90

## 2018-05-05 LAB — COMPREHENSIVE METABOLIC PANEL
ALT: 29 U/L (ref 0–44)
AST: 48 U/L — ABNORMAL HIGH (ref 15–41)
Albumin: 3.5 g/dL (ref 3.5–5.0)
Alkaline Phosphatase: 64 U/L (ref 38–126)
Anion gap: 8 (ref 5–15)
BUN: 9 mg/dL (ref 8–23)
CO2: 23 mmol/L (ref 22–32)
CREATININE: 0.55 mg/dL (ref 0.44–1.00)
Calcium: 8.4 mg/dL — ABNORMAL LOW (ref 8.9–10.3)
Chloride: 107 mmol/L (ref 98–111)
GFR calc Af Amer: >60 mL/min (ref >60)
GFR calc non Af Amer: >60 mL/min (ref >60)
Glucose, Bld: 128 mg/dL — ABNORMAL HIGH (ref 70–99)
Potassium: 3.9 mmol/L (ref 3.5–5.1)
Sodium: 138 mmol/L (ref 135–145)
Total Bilirubin: 1.9 mg/dL — ABNORMAL HIGH (ref 0.3–1.2)
Total Protein: 7 g/dL (ref 6.5–8.1)

## 2018-05-05 LAB — URINALYSIS, ROUTINE W REFLEX MICROSCOPIC
BILIRUBIN URINE: NEGATIVE
Glucose, UA: NEGATIVE mg/dL
Ketones, ur: NEGATIVE mg/dL
Nitrite: NEGATIVE
Protein, ur: NEGATIVE mg/dL
Specific Gravity, Urine: 1.009 (ref 1.005–1.030)
pH: 6 (ref 5.0–8.0)

## 2018-05-05 LAB — GLUCOSE, CAPILLARY: Glucose-Capillary: 122 mg/dL — ABNORMAL HIGH (ref 70–99)

## 2018-05-05 LAB — PROTIME-INR
INR: 1.34
Prothrombin Time: 16.4 seconds — ABNORMAL HIGH (ref 11.4–15.2)

## 2018-05-05 LAB — CBC
HCT: 41.5 % (ref 36.0–46.0)
Hemoglobin: 13.6 g/dL (ref 12.0–15.0)
MCH: 33.4 pg (ref 26.0–34.0)
MCHC: 32.8 g/dL (ref 30.0–36.0)
MCV: 102 fL — ABNORMAL HIGH (ref 80.0–100.0)
Platelets: 169 10*3/uL (ref 150–400)
RBC: 4.07 MIL/uL (ref 3.87–5.11)
RDW: 13.7 % (ref 11.5–15.5)
WBC: 5.5 10*3/uL (ref 4.0–10.5)
nRBC: 0 % (ref 0.0–0.2)

## 2018-05-05 LAB — APTT: aPTT: 32 seconds (ref 24–36)

## 2018-05-05 LAB — HEMOGLOBIN A1C
Hgb A1c MFr Bld: 6 % — ABNORMAL HIGH (ref 4.8–5.6)
Mean Plasma Glucose: 125.5 mg/dL

## 2018-05-05 LAB — ABO/RH: ABO/RH(D): O POS

## 2018-05-05 NOTE — Progress Notes (Signed)
PCP: Consuello Masse  CARDIOLOGIST:none INFO IN Epic:pt 16.4, ua abnormal culture pending,EKG ST 125 rate   INFO ON CHART: history , DM and HTN   BLOOD THINNERS AND LAST DOSES: 81 mg asa stopped 04-30-18 ____________________________________  PATIENT SYMPTOMS AT TIME OF PREOP:  Asymptomatic with ST, Pt. Walked up hill in rain to preop appt. After preop. Rechecked her pulse was 83.

## 2018-05-06 ENCOUNTER — Telehealth: Payer: Self-pay

## 2018-05-06 DIAGNOSIS — N390 Urinary tract infection, site not specified: Secondary | ICD-10-CM

## 2018-05-06 DIAGNOSIS — R319 Hematuria, unspecified: Principal | ICD-10-CM

## 2018-05-06 MED ORDER — NITROFURANTOIN MONOHYD MACRO 100 MG PO CAPS: 100.0000 mg | ORAL_CAPSULE | Freq: Two times a day (BID) | ORAL | 0 refills | Status: DC

## 2018-05-06 NOTE — Telephone Encounter (Signed)
Told Jordan Le that the urine culture from pre-op testing shows that she has a UTI per Joylene John, NP. Melissa is sending in [place] 100 mg po bid x 5 days to [facility].  She needs to get 2 doses in today prior to her surgery tomorrow. Push fluids. Eight  ounces of water every 2 hours today til bed time. Pt verbalized understanding.

## 2018-05-07 ENCOUNTER — Ambulatory Visit (HOSPITAL_COMMUNITY)
Admission: RE | Admit: 2018-05-07 | Discharge: 2018-05-07 | Disposition: A | Discharge status: Home / Self Care | Payer: PPO | Attending: Gynecologic Oncology | Admitting: Gynecologic Oncology

## 2018-05-07 ENCOUNTER — Encounter (HOSPITAL_COMMUNITY)
Admission: RE | Disposition: A | Discharge status: Home / Self Care | Payer: Self-pay | Source: Home / Self Care | Attending: Gynecologic Oncology

## 2018-05-07 ENCOUNTER — Encounter (HOSPITAL_COMMUNITY): Payer: Self-pay

## 2018-05-07 ENCOUNTER — Ambulatory Visit (HOSPITAL_COMMUNITY): Payer: PPO | Admitting: Anesthesiology

## 2018-05-07 ENCOUNTER — Ambulatory Visit (HOSPITAL_COMMUNITY): Payer: PPO | Admitting: Physician Assistant

## 2018-05-07 DIAGNOSIS — I10 Essential (primary) hypertension: Secondary | ICD-10-CM | POA: Diagnosis not present

## 2018-05-07 DIAGNOSIS — Z6837 Body mass index (BMI) 37.0-37.9, adult: Secondary | ICD-10-CM | POA: Insufficient documentation

## 2018-05-07 DIAGNOSIS — N800 Endometriosis of uterus: Secondary | ICD-10-CM | POA: Insufficient documentation

## 2018-05-07 DIAGNOSIS — E7800 Pure hypercholesterolemia, unspecified: Secondary | ICD-10-CM | POA: Insufficient documentation

## 2018-05-07 DIAGNOSIS — C541 Malignant neoplasm of endometrium: Secondary | ICD-10-CM | POA: Diagnosis not present

## 2018-05-07 DIAGNOSIS — Z7984 Long term (current) use of oral hypoglycemic drugs: Secondary | ICD-10-CM | POA: Diagnosis not present

## 2018-05-07 DIAGNOSIS — E669 Obesity, unspecified: Secondary | ICD-10-CM | POA: Insufficient documentation

## 2018-05-07 DIAGNOSIS — Z79899 Other long term (current) drug therapy: Secondary | ICD-10-CM | POA: Insufficient documentation

## 2018-05-07 DIAGNOSIS — E119 Type 2 diabetes mellitus without complications: Secondary | ICD-10-CM | POA: Insufficient documentation

## 2018-05-07 DIAGNOSIS — Z7982 Long term (current) use of aspirin: Secondary | ICD-10-CM | POA: Diagnosis not present

## 2018-05-07 DIAGNOSIS — N390 Urinary tract infection, site not specified: Secondary | ICD-10-CM | POA: Diagnosis present

## 2018-05-07 DIAGNOSIS — N898 Other specified noninflammatory disorders of vagina: Secondary | ICD-10-CM | POA: Insufficient documentation

## 2018-05-07 DIAGNOSIS — Z801 Family history of malignant neoplasm of trachea, bronchus and lung: Secondary | ICD-10-CM | POA: Insufficient documentation

## 2018-05-07 HISTORY — PX: ROBOTIC ASSISTED TOTAL HYSTERECTOMY WITH BILATERAL SALPINGO OOPHERECTOMY: SHX6086

## 2018-05-07 HISTORY — DX: Malignant neoplasm of endometrium: C54.1

## 2018-05-07 HISTORY — PX: SENTINEL NODE BIOPSY: SHX6608

## 2018-05-07 LAB — URINE CULTURE: Culture: >=100000 — AB

## 2018-05-07 LAB — TYPE AND SCREEN
ABO/RH(D): O POS
Antibody Screen: NEGATIVE

## 2018-05-07 LAB — GLUCOSE, CAPILLARY
GLUCOSE-CAPILLARY: 105 mg/dL — AB (ref 70–99)
Glucose-Capillary: 111 mg/dL — ABNORMAL HIGH (ref 70–99)

## 2018-05-07 SURGERY — HYSTERECTOMY, TOTAL, ROBOT-ASSISTED, LAPAROSCOPIC, WITH BILATERAL SALPINGO-OOPHORECTOMY
Anesthesia: General | Laterality: Bilateral

## 2018-05-07 MED ORDER — FENTANYL CITRATE (PF) 100 MCG/2ML IJ SOLN
INTRAMUSCULAR | Status: AC
  Administered 2018-05-07: 25 ug via INTRAVENOUS
  Filled 2018-05-07: qty 2

## 2018-05-07 MED ORDER — PHENYLEPHRINE 40 MCG/ML (10ML) SYRINGE FOR IV PUSH (FOR BLOOD PRESSURE SUPPORT)
PREFILLED_SYRINGE | INTRAVENOUS | Status: AC
  Filled 2018-05-07: qty 10

## 2018-05-07 MED ORDER — ONDANSETRON HCL 4 MG/2ML IJ SOLN
INTRAMUSCULAR | Status: AC
  Filled 2018-05-07: qty 2

## 2018-05-07 MED ORDER — SUGAMMADEX SODIUM 200 MG/2ML IV SOLN
INTRAVENOUS | Status: AC
  Filled 2018-05-07: qty 2

## 2018-05-07 MED ORDER — FENTANYL CITRATE (PF) 100 MCG/2ML IJ SOLN: 25.0000 ug | INTRAMUSCULAR | Status: DC | PRN

## 2018-05-07 MED ORDER — LACTATED RINGERS IR SOLN
Status: DC | PRN
  Administered 2018-05-07: 1

## 2018-05-07 MED ORDER — ONDANSETRON HCL 4 MG/2ML IJ SOLN: 4.0000 mg | Freq: Once | INTRAMUSCULAR | Status: DC | PRN

## 2018-05-07 MED ORDER — INDOCYANINE GREEN 25 MG IV SOLR
INTRAVENOUS | Status: DC | PRN
  Administered 2018-05-07: 2.5 mg

## 2018-05-07 MED ORDER — SILVER NITRATE-POT NITRATE 75-25 % EX MISC
CUTANEOUS | Status: AC
  Filled 2018-05-07: qty 1

## 2018-05-07 MED ORDER — STERILE WATER FOR INJECTION IJ SOLN
INTRAMUSCULAR | Status: DC | PRN
  Administered 2018-05-07: 4 mL

## 2018-05-07 MED ORDER — SUFENTANIL CITRATE 50 MCG/ML IV SOLN
INTRAVENOUS | Status: DC | PRN
  Administered 2018-05-07 (×3): 5 ug via INTRAVENOUS
  Administered 2018-05-07: 10 ug via INTRAVENOUS
  Administered 2018-05-07 (×2): 5 ug via INTRAVENOUS

## 2018-05-07 MED ORDER — HEMOSTATIC AGENTS (NO CHARGE) OPTIME
TOPICAL | Status: DC | PRN
  Administered 2018-05-07: 1 via TOPICAL

## 2018-05-07 MED ORDER — LIDOCAINE 2% (20 MG/ML) 5 ML SYRINGE
INTRAMUSCULAR | Status: DC | PRN
  Administered 2018-05-07: 60 mg via INTRAVENOUS

## 2018-05-07 MED ORDER — CEFAZOLIN SODIUM-DEXTROSE 2-4 GM/100ML-% IV SOLN
2.0000 g | INTRAVENOUS | Status: AC
  Administered 2018-05-07: 2 g via INTRAVENOUS
  Filled 2018-05-07: qty 100

## 2018-05-07 MED ORDER — PHENYLEPHRINE 40 MCG/ML (10ML) SYRINGE FOR IV PUSH (FOR BLOOD PRESSURE SUPPORT)
PREFILLED_SYRINGE | INTRAVENOUS | Status: DC | PRN
  Administered 2018-05-07: 80 ug via INTRAVENOUS

## 2018-05-07 MED ORDER — PROPOFOL 10 MG/ML IV BOLUS
INTRAVENOUS | Status: AC
  Filled 2018-05-07: qty 20

## 2018-05-07 MED ORDER — OXYCODONE HCL 5 MG PO TABS: 5.0000 mg | ORAL_TABLET | Freq: Once | ORAL | Status: DC | PRN

## 2018-05-07 MED ORDER — FENTANYL CITRATE (PF) 100 MCG/2ML IJ SOLN
25.0000 ug | INTRAMUSCULAR | Status: DC | PRN
  Administered 2018-05-07 (×2): 25 ug via INTRAVENOUS

## 2018-05-07 MED ORDER — SODIUM CHLORIDE (PF) 0.9 % IJ SOLN
INTRAMUSCULAR | Status: AC
  Filled 2018-05-07: qty 10

## 2018-05-07 MED ORDER — PROPOFOL 10 MG/ML IV BOLUS
INTRAVENOUS | Status: DC | PRN
  Administered 2018-05-07: 140 mg via INTRAVENOUS

## 2018-05-07 MED ORDER — SCOPOLAMINE 1 MG/3DAYS TD PT72
1.0000 | MEDICATED_PATCH | TRANSDERMAL | Status: DC
  Administered 2018-05-07: 1.5 mg via TRANSDERMAL
  Filled 2018-05-07: qty 1

## 2018-05-07 MED ORDER — BUPIVACAINE HCL (PF) 0.25 % IJ SOLN
INTRAMUSCULAR | Status: DC | PRN
  Administered 2018-05-07: 15 mL

## 2018-05-07 MED ORDER — ONDANSETRON HCL 4 MG/2ML IJ SOLN
INTRAMUSCULAR | Status: DC | PRN
  Administered 2018-05-07: 4 mg via INTRAVENOUS

## 2018-05-07 MED ORDER — DEXAMETHASONE SODIUM PHOSPHATE 10 MG/ML IJ SOLN
INTRAMUSCULAR | Status: DC | PRN
  Administered 2018-05-07: 10 mg via INTRAVENOUS

## 2018-05-07 MED ORDER — SUGAMMADEX SODIUM 500 MG/5ML IV SOLN
INTRAVENOUS | Status: AC
  Filled 2018-05-07: qty 5

## 2018-05-07 MED ORDER — ROCURONIUM BROMIDE 100 MG/10ML IV SOLN
INTRAVENOUS | Status: DC | PRN
  Administered 2018-05-07: 60 mg via INTRAVENOUS
  Administered 2018-05-07: 5 mg via INTRAVENOUS
  Administered 2018-05-07: 10 mg via INTRAVENOUS

## 2018-05-07 MED ORDER — LACTATED RINGERS IV SOLN
INTRAVENOUS | Status: DC
  Administered 2018-05-07: 09:00:00 via INTRAVENOUS

## 2018-05-07 MED ORDER — OXYCODONE HCL 5 MG/5ML PO SOLN
5.0000 mg | Freq: Once | ORAL | Status: DC | PRN
  Filled 2018-05-07: qty 5

## 2018-05-07 MED ORDER — DEXAMETHASONE SODIUM PHOSPHATE 10 MG/ML IJ SOLN
INTRAMUSCULAR | Status: AC
  Filled 2018-05-07: qty 1

## 2018-05-07 MED ORDER — SUGAMMADEX SODIUM 200 MG/2ML IV SOLN
INTRAVENOUS | Status: DC | PRN
  Administered 2018-05-07: 200 mg via INTRAVENOUS

## 2018-05-07 MED ORDER — OXYCODONE-ACETAMINOPHEN 5-325 MG PO TABS: 1.0000 | ORAL_TABLET | ORAL | 0 refills | Status: AC | PRN

## 2018-05-07 MED ORDER — DEXAMETHASONE SODIUM PHOSPHATE 4 MG/ML IJ SOLN: 4.0000 mg | INTRAMUSCULAR | Status: DC

## 2018-05-07 MED ORDER — STERILE WATER FOR INJECTION IJ SOLN
INTRAMUSCULAR | Status: AC
  Filled 2018-05-07: qty 10

## 2018-05-07 MED ORDER — SILVER NITRATE-POT NITRATE 75-25 % EX MISC
CUTANEOUS | Status: DC | PRN
  Administered 2018-05-07: 1 via TOPICAL

## 2018-05-07 MED ORDER — ENOXAPARIN SODIUM 40 MG/0.4ML ~~LOC~~ SOLN
40.0000 mg | SUBCUTANEOUS | Status: AC
  Administered 2018-05-07: 40 mg via SUBCUTANEOUS
  Filled 2018-05-07: qty 0.4

## 2018-05-07 MED ORDER — SUFENTANIL CITRATE 50 MCG/ML IV SOLN
INTRAVENOUS | Status: AC
  Filled 2018-05-07: qty 1

## 2018-05-07 MED ORDER — LACTATED RINGERS IV SOLN
INTRAVENOUS | Status: DC | PRN
  Administered 2018-05-07: 10:00:00 via INTRAVENOUS

## 2018-05-07 MED ORDER — ROCURONIUM BROMIDE 100 MG/10ML IV SOLN
INTRAVENOUS | Status: AC
  Filled 2018-05-07: qty 1

## 2018-05-07 MED ORDER — BUPIVACAINE HCL (PF) 0.25 % IJ SOLN
INTRAMUSCULAR | Status: AC
  Filled 2018-05-07: qty 30

## 2018-05-07 MED ORDER — LIDOCAINE 2% (20 MG/ML) 5 ML SYRINGE
INTRAMUSCULAR | Status: AC
  Filled 2018-05-07: qty 5

## 2018-05-07 SURGICAL SUPPLY — 54 items
APPLICATOR SURGIFLO ENDO (HEMOSTASIS) ×2 IMPLANT
BAG LAPAROSCOPIC 12 15 PORT 16 (BASKET) IMPLANT
BAG RETRIEVAL 12/15 (BASKET)
COVER BACK TABLE 60X90IN (DRAPES) ×2 IMPLANT
COVER TIP SHEARS 8 DVNC (MISCELLANEOUS) ×1 IMPLANT
COVER TIP SHEARS 8MM DA VINCI (MISCELLANEOUS) ×1
COVER WAND RF STERILE (DRAPES) IMPLANT
DECANTER SPIKE VIAL GLASS SM (MISCELLANEOUS) ×2 IMPLANT
DERMABOND ADVANCED (GAUZE/BANDAGES/DRESSINGS) ×1
DERMABOND ADVANCED .7 DNX12 (GAUZE/BANDAGES/DRESSINGS) ×1 IMPLANT
DRAPE ARM DVNC X/XI (DISPOSABLE) ×4 IMPLANT
DRAPE COLUMN DVNC XI (DISPOSABLE) ×1 IMPLANT
DRAPE DA VINCI XI ARM (DISPOSABLE) ×4
DRAPE DA VINCI XI COLUMN (DISPOSABLE) ×1
DRAPE SHEET LG 3/4 BI-LAMINATE (DRAPES) ×2 IMPLANT
DRAPE SURG IRRIG POUCH 19X23 (DRAPES) ×2 IMPLANT
ELECT REM PT RETURN 15FT ADLT (MISCELLANEOUS) ×2 IMPLANT
GAUZE SPONGE 4X4 16PLY XRAY LF (GAUZE/BANDAGES/DRESSINGS) ×2 IMPLANT
GLOVE BIO SURGEON STRL SZ 6 (GLOVE) ×8 IMPLANT
GLOVE BIO SURGEON STRL SZ 6.5 (GLOVE) ×4 IMPLANT
GOWN STRL REUS W/ TWL LRG LVL3 (GOWN DISPOSABLE) ×2 IMPLANT
GOWN STRL REUS W/TWL LRG LVL3 (GOWN DISPOSABLE) ×2
HOLDER FOLEY CATH W/STRAP (MISCELLANEOUS) ×2 IMPLANT
IRRIG SUCT STRYKERFLOW 2 WTIP (MISCELLANEOUS) ×2
IRRIGATION SUCT STRKRFLW 2 WTP (MISCELLANEOUS) ×1 IMPLANT
KIT PROCEDURE DA VINCI SI (MISCELLANEOUS) ×1
KIT PROCEDURE DVNC SI (MISCELLANEOUS) ×1 IMPLANT
MANIPULATOR UTERINE 4.5 ZUMI (MISCELLANEOUS) ×2 IMPLANT
NEEDLE HYPO 22GX1.5 SAFETY (NEEDLE) ×2 IMPLANT
NEEDLE SPNL 18GX3.5 QUINCKE PK (NEEDLE) ×2 IMPLANT
OBTURATOR OPTICAL STANDARD 8MM (TROCAR) ×1
OBTURATOR OPTICAL STND 8 DVNC (TROCAR) ×1
OBTURATOR OPTICALSTD 8 DVNC (TROCAR) ×1 IMPLANT
PACK ROBOT GYN CUSTOM WL (TRAY / TRAY PROCEDURE) ×2 IMPLANT
PAD POSITIONING PINK XL (MISCELLANEOUS) ×2 IMPLANT
PORT ACCESS TROCAR AIRSEAL 12 (TROCAR) ×1 IMPLANT
PORT ACCESS TROCAR AIRSEAL 5M (TROCAR) ×1
POUCH SPECIMEN RETRIEVAL 10MM (ENDOMECHANICALS) ×2 IMPLANT
SEAL CANN UNIV 5-8 DVNC XI (MISCELLANEOUS) ×4 IMPLANT
SEAL XI 5MM-8MM UNIVERSAL (MISCELLANEOUS) ×4
SET TRI-LUMEN FLTR TB AIRSEAL (TUBING) ×2 IMPLANT
SURGIFLO W/THROMBIN 8M KIT (HEMOSTASIS) ×2 IMPLANT
SUT VIC AB 0 CT1 27 (SUTURE)
SUT VIC AB 0 CT1 27XBRD ANTBC (SUTURE) IMPLANT
SUT VIC AB 2-0 CT1 27 (SUTURE) ×1
SUT VIC AB 2-0 CT1 27XBRD (SUTURE) ×1 IMPLANT
SUT VIC AB 3-0 SH 27 (SUTURE) ×1
SUT VIC AB 3-0 SH 27X BRD (SUTURE) ×1 IMPLANT
SYR 10ML LL (SYRINGE) ×2 IMPLANT
TOWEL OR NON WOVEN STRL DISP B (DISPOSABLE) ×2 IMPLANT
TRAP SPECIMEN MUCOUS 40CC (MISCELLANEOUS) IMPLANT
TRAY FOLEY MTR SLVR 16FR STAT (SET/KITS/TRAYS/PACK) ×2 IMPLANT
UNDERPAD 30X30 (UNDERPADS AND DIAPERS) ×2 IMPLANT
WATER STERILE IRR 1000ML POUR (IV SOLUTION) ×2 IMPLANT

## 2018-05-07 NOTE — Anesthesia Preprocedure Evaluation (Addendum)
Anesthesia Evaluation  Patient identified by MRN, date of birth, ID band Patient awake    Reviewed: Allergy & Precautions, NPO status , Patient's Chart, lab work & pertinent test results  Airway Mallampati: II  TM Distance: >3 FB Neck ROM: Full    Dental  (+) Teeth Intact, Dental Advisory Given   Pulmonary    breath sounds clear to auscultation       Cardiovascular hypertension,  Rhythm:Regular Rate:Normal     Neuro/Psych    GI/Hepatic   Endo/Other  diabetes  Renal/GU      Musculoskeletal   Abdominal (+) + obese,   Peds  Hematology   Anesthesia Other Findings   Reproductive/Obstetrics                             Anesthesia Physical Anesthesia Plan  ASA: III  Anesthesia Plan: General   Post-op Pain Management:    Induction: Intravenous  PONV Risk Score and Plan: Ondansetron and Dexamethasone  Airway Management Planned: Oral ETT  Additional Equipment:   Intra-op Plan:   Post-operative Plan: Extubation in OR  Informed Consent: I have reviewed the patients History and Physical, chart, labs and discussed the procedure including the risks, benefits and alternatives for the proposed anesthesia with the patient or authorized representative who has indicated his/her understanding and acceptance.     Dental advisory given  Plan Discussed with: CRNA and Anesthesiologist  Anesthesia Plan Comments:         Anesthesia Quick Evaluation  

## 2018-05-07 NOTE — Transfer of Care (Signed)
Immediate Anesthesia Transfer of Care Note  Patient: Jordan Le  Procedure(s) Performed: XI ROBOTIC ASSISTED TOTAL HYSTERECTOMY WITH BILATERAL SALPINGO OOPHORECTOMY PELVIC LYMPHANECTOMY (Bilateral ) SENTINEL NODE BIOPSY (Bilateral )  Patient Location: PACU  Anesthesia Type:General  Level of Consciousness: awake and alert   Airway & Oxygen Therapy: Patient Spontanous Breathing and Patient connected to face mask oxygen  Post-op Assessment: Report given to RN and Post -op Vital signs reviewed and stable  Post vital signs: Reviewed and stable  Last Vitals:  Vitals Value Taken Time  BP 165/66 05/07/2018 12:50 PM  Temp 36.4 C 05/07/2018 12:50 PM  Pulse 85 05/07/2018 12:54 PM  Resp 14 05/07/2018 12:54 PM  SpO2 100 % 05/07/2018 12:54 PM  Vitals shown include unvalidated device data.  Last Pain:  Vitals:   05/07/18 1250  PainSc: 4          Complications: No apparent anesthesia complications

## 2018-05-07 NOTE — Anesthesia Procedure Notes (Signed)
Procedure Name: Intubation Date/Time: 05/07/2018 10:39 AM Performed by: Sharlette Dense, CRNA Patient Re-evaluated:Patient Re-evaluated prior to induction Oxygen Delivery Method: Circle system utilized Preoxygenation: Pre-oxygenation with 100% oxygen Induction Type: IV induction Ventilation: Mask ventilation without difficulty and Oral airway inserted - appropriate to patient size Laryngoscope Size: Sabra Heck and 2 Grade View: Grade II Tube type: Oral Tube size: 7.5 mm Number of attempts: 1 Airway Equipment and Method: Stylet Placement Confirmation: ETT inserted through vocal cords under direct vision,  positive ETCO2 and breath sounds checked- equal and bilateral Secured at: 21 cm Tube secured with: Tape Dental Injury: Teeth and Oropharynx as per pre-operative assessment

## 2018-05-07 NOTE — Interval H&P Note (Signed)
History and Physical Interval Note:  05/07/2018 9:35 AM  Jordan Le  has presented today for surgery, with the diagnosis of ENDOMETRIAL CANCER  The various methods of treatment have been discussed with the patient and family. After consideration of risks, benefits and other options for treatment, the patient has consented to  Procedure(s): XI ROBOTIC ASSISTED TOTAL HYSTERECTOMY WITH BILATERAL SALPINGO OOPHORECTOMY (Bilateral) SENTINEL NODE BIOPSY (Bilateral) as a surgical intervention .  The patient's history has been reviewed, patient examined, no change in status, stable for surgery.  I have reviewed the patient's chart and labs.  Questions were answered to the patient's satisfaction.     Thereasa Solo

## 2018-05-07 NOTE — Op Note (Signed)
OPERATIVE NOTE 05/07/18  Surgeon: Donaciano Eva   Assistants: Dr Lahoma Crocker (an MD assistant was necessary for tissue manipulation, management of robotic instrumentation, retraction and positioning due to the complexity of the case and hospital policies).   Anesthesia: General endotracheal anesthesia  ASA Class: 3  Pre-operative Diagnosis: endometrial cancer grade 1  Post-operative Diagnosis: same,   Operation: Robotic-assisted laparoscopic total hysterectomy with bilateral salpingoophorectomy, SLN biopsy   Surgeon: Donaciano Eva  Assistant Surgeon: Lahoma Crocker MD  Anesthesia: GET  Urine Output: 200cc  Operative Findings:  : 8cm uterus (arcuate shaped), normal appearing ovaries, no suspicious nodes, unilateral mapping to left.   Estimated Blood Loss:  less than 50 mL      Total IV Fluids: 800 ml         Specimens: uterus, cervix, bilateral tubes and ovaries, left external iliac SLN, right pelvic lymph nodes         Complications:  None; patient tolerated the procedure well.         Disposition: PACU - hemodynamically stable.  Procedure Details  The patient was seen in the Holding Room. The risks, benefits, complications, treatment options, and expected outcomes were discussed with the patient.  The patient concurred with the proposed plan, giving informed consent.  The site of surgery properly noted/marked. The patient was identified as Jordan Le and the procedure verified as a Robotic-assisted hysterectomy with bilateral salpingo oophorectomy with SLN biopsy. A Time Out was held and the above information confirmed.  After induction of anesthesia, the patient was draped and prepped in the usual sterile manner. Pt was placed in supine position after anesthesia and draped and prepped in the usual sterile manner. The abdominal drape was placed after the CholoraPrep had been allowed to dry for 3 minutes.  Her arms were tucked to her side with all  appropriate precautions.  The shoulders were stabilized with padded shoulder blocks applied to the acromium processes.  The patient was placed in the semi-lithotomy position in [place].  The perineum was prepped with Betadine. The patient was then prepped. Foley catheter was placed.  A sterile speculum was placed in the vagina.  The cervix was grasped with a single-tooth tenaculum. 2mg  total of ICG was injected into the cervical stroma at 2 and 9 o'clock with 1cc injected at a 1cm and 31mm depth (concentration 0.5mg /ml) in all locations. The cervix was dilated with Kennon Rounds dilators.  The ZUMI uterine manipulator with a medium colpotomizer ring was placed without difficulty.  A pneum occluder balloon was placed over the manipulator.  OG tube placement was confirmed and to suction.   Next, a 5 mm skin incision was made 1 cm below the subcostal margin in the midclavicular line.  The 5 mm Optiview port and scope was used for direct entry.  Opening pressure was under 10 mm CO2.  The abdomen was insufflated and the findings were noted as above.   At this point and all points during the procedure, the patient's intra-abdominal pressure did not exceed 15 mmHg. Next, a 10 mm skin incision was made 2cm above the umbilicus and a right and left port was placed about 10 cm lateral to the robot port on the right and left side.  A fourth arm was placed in the left lower quadrant 2 cm above and superior and medial to the anterior superior iliac spine.  All ports were placed under direct visualization.  The patient was placed in steep Trendelenburg.  Bowel was  folded away into the upper abdomen.  The robot was docked in the normal manner.  The right and left peritoneum were opened parallel to the IP ligament to open the retroperitoneal spaces bilaterally. The SLN mapping was performed in bilateral pelvic basins. The para rectal and paravesical spaces were opened up entirely with careful dissection below the level of the  ureters bilaterally and to the depth of the uterine artery origin in order to skeletonize the uterine "web" and ensure visualization of all parametrial channels. The para-aortic basins were carefully exposed and evaluated for isolated para-aortic SLN's. Lymphatic channels were identified travelling to the following visualized sentinel lymph node's: unilateral mapping to the left external iliac SLN. These SLN's were separated from their surrounding lymphatic tissue, removed and sent for permanent pathology.  The hysterectomy was started after the round ligament on the right side was incised and the retroperitoneum was entered and the pararectal space was developed.  The ureter was noted to be on the medial leaf of the broad ligament.  The peritoneum above the ureter was incised and stretched and the infundibulopelvic ligament was skeletonized, cauterized and cut.  The posterior peritoneum was taken down to the level of the KOH ring.  The anterior peritoneum was also taken down.  The bladder flap was created to the level of the KOH ring.  The uterine artery on the right side was skeletonized, cauterized and cut in the normal manner.  A similar procedure was performed on the left.  The colpotomy was made and the uterus, cervix, bilateral ovaries and tubes were amputated and delivered through the vagina.    The paravesical space was developed with monopolar and sharp dissection. It was held open with tension on the median umbilical ligament with the forth arm. The paravesical space was opened with blunt and sharp dissection to mobilize the ureter off of the medial surface of the internal iliac artery. The medial leaf of the broad ligament containing the ureter was held medially (opening the pararectal space) by the assistant's grasper. The right pelvic lymphadenectomy was performed by skeletonizing the internal iliac artery at the bifurcation with the external iliac artery. The obturator nerve was identified in the  base of lateral paravesical space. The ureter was mobilized medially off of the dissection by developing the pararectal space. The genitofemoral nerve was identified, skeletonized and mobilized laterally off of the external iliac artery. An enbloc resection of lymph nodes was performed within the following boundaries: the mid portion of the common iliac proximally, the circumflex iliac vein distally, the obturator nerve posteriorally, the genitofemoral nerve laterally. The nodal basin (including obturator space) were confirmed to be empty of nodes and hemostatic. The nodes were placed in an endocatch bag and retrieved vaginally.  Pedicles were inspected and excellent hemostasis was achieved.    The colpotomy at the vaginal cuff was closed with Vicryl on a CT1 needle in a running manner.  Irrigation was used and excellent hemostasis was achieved.  At this point in the procedure was completed.  Robotic instruments were removed under direct visulaization.  The robot was undocked. The 10 mm ports were closed with Vicryl on a UR-5 needle and the fascia was closed with 0 Vicryl on a UR-5 needle.  The skin was closed with 4-0 Vicryl in a subcuticular manner.  Dermabond was applied.  Sponge, lap and needle counts correct x 2.  The patient was taken to the recovery room in stable condition.  The vagina was swabbed with bleeding noted from  the sidewall of the vagina made hemostatic with running vicryl suture and an interrupted figure of 8 at the urethral meatus. All instrument and needle counts were correct x  3.   The patient was transferred to the recovery room in a stable condition.  Donaciano Eva, MD

## 2018-05-07 NOTE — Discharge Instructions (Signed)
05/07/2018  Activity: 1. Be up and out of the bed during the day.  Take a nap if needed.  You may walk up steps but be careful and use the hand rail.  Stair climbing will tire you more than you think, you may need to stop part way and rest.   2. No lifting or straining for 4 weeks.  3. No driving for 1 weeks.  Do Not drive if you are taking narcotic pain medicine.  4. Shower daily.  Use soap and water on your incision and pat dry; don't rub.   5. No sexual activity and nothing in the vagina for 8 weeks.  Medications:  - Take ibuprofen and tylenol first line for pain control. Take these regularly (every 6 hours) to decrease the build up of pain.  - If necessary, for severe pain not relieved by ibuprofen, take percocet.  - While taking percocet you should take sennakot every night to reduce the likelihood of constipation. If this causes diarrhea, stop its use.  Diet: 1. Low sodium Heart Healthy Diet is recommended.  2. It is safe to use a laxative if you have difficulty moving your bowels.   Wound Care: 1. Keep clean and dry.  Shower daily.  Reasons to call the Doctor:   Fever - Oral temperature greater than 100.4 degrees Fahrenheit  Foul-smelling vaginal discharge  Difficulty urinating  Nausea and vomiting  Increased pain at the site of the incision that is unrelieved with pain medicine.  Difficulty breathing with or without chest pain  New calf pain especially if only on one side  Sudden, continuing increased vaginal bleeding with or without clots.   Follow-up: 1. See Jordan Le in 3 weeks.  Contacts: For questions or concerns you should contact:  Dr. Everitt Le at [phone] After hours and on week-ends call [phone] and ask to speak to the physician on call for Gynecologic Oncology   Total Laparoscopic Hysterectomy, Care After This sheet gives you information about how to care for yourself after your procedure. Your health care provider may also give  you more specific instructions. If you have problems or questions, contact your health care provider. What can I expect after the procedure? After the procedure, it is common to have:  Pain and bruising around your incisions.  A sore throat, if a breathing tube was used during surgery.  Fatigue.  Poor appetite.  Less interest in sex. If your ovaries were also removed, it is also common to have symptoms of menopause such as hot flashes, night sweats, and lack of sleep (insomnia). Follow these instructions at home: Bathing  Do not take baths, swim, or use a hot tub until your health care provider approves. You may need to only take showers for 2-3 weeks.  Keep your bandage (dressing) dry until your health care provider says it can be removed. Incision care   Follow instructions from your health care provider about how to take care of your incisions. Make sure you: ? Wash your hands with soap and water before you change your dressing. If soap and water are not available, use hand sanitizer. ? Change your dressing as told by your health care provider. ? Leave stitches (sutures), skin glue, or adhesive strips in place. These skin closures may need to stay in place for 2 weeks or longer. If adhesive strip edges start to loosen and curl up, you may trim the loose edges. Do not remove adhesive strips completely unless your health care  provider tells you to do that.  Check your incision area every day for signs of infection. Check for: ? Redness, swelling, or pain. ? Fluid or blood. ? Warmth. ? Pus or a bad smell. Activity  Get plenty of rest and sleep.  Do not lift anything that is heavier than 10 lbs (4.5 kg) for one month after surgery, or as long as told by your health care provider.  Do not drive or use heavy machinery while taking prescription pain medicine.  Do not drive for 24 hours if you were given a medicine to help you relax (sedative).  Return to your normal activities  as told by your health care provider. Ask your health care provider what activities are safe for you. Lifestyle   Do not use any products that contain nicotine or tobacco, such as cigarettes and e-cigarettes. These can delay healing. If you need help quitting, ask your health care provider.  Do not drink alcohol until your health care provider approves. General instructions  Do not douche, use tampons, or have sex for at least 6 weeks, or as told by your health care provider.  Take over-the-counter and prescription medicines only as told by your health care provider.  To monitor yourself for a fever, take your temperature at least once a day during recovery.  If you struggle with physical or emotional changes after your procedure, speak with your health care provider or a therapist.  To prevent or treat constipation while you are taking prescription pain medicine, your health care provider may recommend that you: ? Drink enough fluid to keep your urine clear or pale yellow. ? Take over-the-counter or prescription medicines. ? Eat foods that are high in fiber, such as fresh fruits and vegetables, whole grains, and beans. ? Limit foods that are high in fat and processed sugars, such as fried and sweet foods.  Keep all follow-up visits as told by your health care provider. This is important. Contact a health care provider if:  You have chills or a fever.  You have redness, swelling, or pain around an incision.  You have fluid or blood coming from an incision.  Your incision feels warm to the touch.  You have pus or a bad smell coming from an incision.  An incision breaks open.  You feel dizzy or light-headed.  You have pain or bleeding when you urinate.  You have diarrhea, nausea, or vomiting that does not go away.  You have abnormal vaginal discharge.  You have a rash.  You have pain that does not get better with medicine. Get help right away if:  You have a fever and  your symptoms suddenly get worse.  You have severe abdominal pain.  You have chest pain.  You have shortness of breath.  You faint.  You have pain, swelling, or redness on your leg.  You have heavy vaginal bleeding with blood clots. Summary  After the procedure it is common to have abdominal pain. Your provider will give you medication for this.  Do not take baths, swim, or use a hot tub until your health care provider approves.  Do not lift anything that is heavier than 10 lbs (4.5 kg) for one month after surgery, or as long as told by your health care provider.  Notify your provider if you have any signs or symptoms of infection after the procedure. This information is not intended to replace advice given to you by your health care provider. Make sure you discuss  any questions you have with your health care provider. Document Released: 12/30/2012 Document Revised: 05/22/2016 Document Reviewed: 05/22/2016 Elsevier Interactive Patient Education  2019 [place] Anesthesia, Adult, Care After This sheet gives you information about how to care for yourself after your procedure. Your health care provider may also give you more specific instructions. If you have problems or questions, contact your health care provider. What can I expect after the procedure? After the procedure, the following side effects are common:  Pain or discomfort at the IV site.  Nausea.  Vomiting.  Sore throat.  Trouble concentrating.  Feeling cold or chills.  Weak or tired.  Sleepiness and fatigue.  Soreness and body aches. These side effects can affect parts of the body that were not involved in surgery. Follow these instructions at home:  For at least 24 hours after the procedure:  Have a responsible adult stay with you. It is important to have someone help care for you until you are awake and alert.  Rest as needed.  Do not: ? Participate in activities in which you could  fall or become injured. ? Drive. ? Use heavy machinery. ? Drink alcohol. ? Take sleeping pills or medicines that cause drowsiness. ? Make important decisions or sign legal documents. ? Take care of children on your own. Eating and drinking  Follow any instructions from your health care provider about eating or drinking restrictions.  When you feel hungry, start by eating small amounts of foods that are soft and easy to digest (bland), such as toast. Gradually return to your regular diet.  Drink enough fluid to keep your urine pale yellow.  If you vomit, rehydrate by drinking water, juice, or clear broth. General instructions  If you have sleep apnea, surgery and certain medicines can increase your risk for breathing problems. Follow instructions from your health care provider about wearing your sleep device: ? Anytime you are sleeping, including during daytime naps. ? While taking prescription pain medicines, sleeping medicines, or medicines that make you drowsy.  Return to your normal activities as told by your health care provider. Ask your health care provider what activities are safe for you.  Take over-the-counter and prescription medicines only as told by your health care provider.  If you smoke, do not smoke without supervision.  Keep all follow-up visits as told by your health care provider. This is important. Contact a health care provider if:  You have nausea or vomiting that does not get better with medicine.  You cannot eat or drink without vomiting.  You have pain that does not get better with medicine.  You are unable to pass urine.  You develop a skin rash.  You have a fever.  You have redness around your IV site that gets worse. Get help right away if:  You have difficulty breathing.  You have chest pain.  You have blood in your urine or stool, or you vomit blood. Summary  After the procedure, it is common to have a sore throat or nausea. It is  also common to feel tired.  Have a responsible adult stay with you for the first 24 hours after general anesthesia. It is important to have someone help care for you until you are awake and alert.  When you feel hungry, start by eating small amounts of foods that are soft and easy to digest (bland), such as toast. Gradually return to your regular diet.  Drink enough fluid to keep your urine  pale yellow.  Return to your normal activities as told by your health care provider. Ask your health care provider what activities are safe for you. This information is not intended to replace advice given to you by your health care provider. Make sure you discuss any questions you have with your health care provider. Document Released: 06/17/2000 Document Revised: 10/25/2016 Document Reviewed: 10/25/2016 Elsevier Interactive Patient Education  2019 [employer].

## 2018-05-08 ENCOUNTER — Telehealth: Payer: Self-pay

## 2018-05-08 ENCOUNTER — Encounter (HOSPITAL_COMMUNITY): Payer: Self-pay | Admitting: Gynecologic Oncology

## 2018-05-08 NOTE — Telephone Encounter (Signed)
Outgoing call to patient per Joylene John NP regarding urine results for pt to continue her Macrobid and to see how she is doing post- op?  Pt reports she is doing "great" - she is eating/drinking and voiding well.  Reports she had bm morning of surgery and took the Senokot as prescribed before bed last night.  Reminded her to continue Macrobid and drink plenty of clear liquids- voiced understanding.  Reminded her to call our office for any concerns especially increased pain, changes in incisions ie drainage, or fever.  Voiced understanding. Confirmed her appt for 3-13 f/u with Dr Denman George.  No other needs per pt at this time.

## 2018-05-10 NOTE — Anesthesia Postprocedure Evaluation (Signed)
Anesthesia Post Note  Patient: Jordan Le  Procedure(s) Performed: XI ROBOTIC ASSISTED TOTAL HYSTERECTOMY WITH BILATERAL SALPINGO OOPHORECTOMY PELVIC LYMPHANECTOMY (Bilateral ) SENTINEL NODE BIOPSY (Bilateral )     Patient location during evaluation: PACU Anesthesia Type: General Level of consciousness: awake and alert Pain management: pain level controlled Vital Signs Assessment: post-procedure vital signs reviewed and stable Respiratory status: spontaneous breathing, nonlabored ventilation, respiratory function stable and patient connected to nasal cannula oxygen Cardiovascular status: blood pressure returned to baseline and stable Postop Assessment: no apparent nausea or vomiting Anesthetic complications: no    Last Vitals:  Vitals:   05/07/18 1403 05/07/18 1507  BP: (!) 159/68 (!) 156/83  Pulse: 89 88  Resp: 16 16  Temp: 36.7 C 36.7 C  SpO2: 95% 97%    Last Pain:  Vitals:   05/08/18 0954  PainSc: [place]

## 2018-05-12 ENCOUNTER — Telehealth: Payer: Self-pay

## 2018-05-12 NOTE — Telephone Encounter (Signed)
Told Jordan Le that the surgical pathology showed that the cancer was contained to the lining of the uterus. No additional therapy needed. Pt continues to do well post-operative.  She states that the incisions are intermittently draining clear fluid since Friday 05-08-18 evening. Pt will apply some Telfa pads and gauze over the incisions and crisscross the medical tape to apply pressure. Jordan Le is  to call if the drainage increases as she will need to be seen and the incisions re glued. Told Jordan Le that the amount of fluid will decrease over time per Dr. Denman George. Pt verbalized understanding.

## 2018-06-04 NOTE — Progress Notes (Signed)
Follow-up Note: Gyn-Onc  Consult was initially requested by Dr. Adah Perl for the evaluation of Jordan Le 73 y.o. female  CC:  Chief Complaint  Patient presents with  . Endometrial cancer [facility])    Assessment/Plan:  Jordan Le  is a 73 y.o.  year old with stage IA grade 1 endometrioid endometrial cancer s/p robotic staging procedure with hysterectomy, BSO and SLN biopsy and right pelvic lymphadenectomy on 05/07/18.  Pathology revealed low risk factors for recurrence, therefore no adjuvant therapy is recommended according to NCCN guidelines.  I discussed risk for recurrence and typical symptoms encouraged her to notify us of these should they develop between visits.  I recommend she have follow-up every 6 months for 5 years in accordance with NCCN guidelines. Those visits should include symptom assessment, physical exam and pelvic examination. Pap smears are not indicated or recommended in the routine surveillance of endometrial cancer.  HPI: Jordan Le is a 73 year old P2 who is seen in consultation at the request of Dr Adah Perl for a grade I endometrial cancer.  The patient reports that she developed postmenopausal bleeding in the spring 2019.  She was seen and evaluated by Dr. Adah Perl who upon vaginal examination felt it was bleeding from her vaginal lesion.  The bleeding got better but then again became worse in December 2019 and she she was reevaluated by Dr. Adah Perl.  At that time a transvaginal ultrasound scan was performed in the office on April 17, 2018.  This revealed a uterus measuring 10.2 x 4.9 x 6.2 cm.  The endometrium was thickened at 26 mm.  There were no focal abnormalities otherwise noted.  The right and left ovaries were grossly normal.  She then underwent an endometrial biopsy on 04/09/18 which revealed an endometrioid adenocarcinoma FIGO grade 1.  The patient reports she had CT imaging performed at [facility] in [place] however we do not have the report  at this time for this.  She was reported that it was normal.  The patient has a past medical history significant for obesity (BMI 37), hypertension, hypercholesterolemia, and type 2 diabetes mellitus on metformin.  Her only prior surgery was a hysteroscopy 11 years ago.  She has a family history significant for mother with lung cancer.  Her diabetes is fairly well controlled as her last hemoglobin A1c in November 2019 was 5.9%.  It is managed by her primary care doctor Dr. Consuello Masse.   Interval Hx:  On 05/07/18 she underwent a robotic assisted total hysterectomy, BSO, SLN biopsy and right pelvic lymphadenectomy. Final pathology revealed a grade 1 tumor which was minimally invasive (inner half of myometrium), with no LVSI, no adnexal/cervical or lymph node involvement. She was assigned a stage of stage IA grade 1 and was determined to be of low risk for recurrence. In accordance with NCCN guidelines no adjuvant therapy was recommended.   Since surgery she has been doing well with no pain.  Current Meds:  Outpatient Encounter Medications as of 06/05/2018  Medication Sig  . aspirin EC 81 MG tablet Take 81 mg by mouth at bedtime.   Marland Kitchen ibuprofen (ADVIL,MOTRIN) 200 MG tablet Take 400 mg by mouth daily as needed for fever, headache or moderate pain.  Marland Kitchen losartan (COZAAR) 25 MG tablet Take 25 mg by mouth daily.  . metFORMIN (GLUCOPHAGE-XR) 500 MG 24 hr tablet Take 500 mg by mouth daily with breakfast.  . simvastatin (ZOCOR) 20 MG tablet Take 20 mg by mouth at bedtime.   . [  DISCONTINUED] guaiFENesin (MUCINEX) 600 MG 12 hr tablet Take 600 mg by mouth 2 (two) times daily as needed for cough.   . [DISCONTINUED] nitrofurantoin, macrocrystal-monohydrate, (MACROBID) 100 MG capsule Take 1 capsule (100 mg total) by mouth 2 (two) times daily.   No facility-administered encounter medications on file as of 06/05/2018.     Allergy: No Known Allergies  Social Hx:   Social History   Socioeconomic History  .  Marital status: Married    Spouse name: Not on file  . Number of children: Not on file  . Years of education: Not on file  . Highest education level: Not on file  Occupational History  . Not on file  Social Needs  . Financial resource strain: Not on file  . Food insecurity:    Worry: Not on file    Inability: Not on file  . Transportation needs:    Medical: Not on file    Non-medical: Not on file  Tobacco Use  . Smoking status: Never Smoker  . Smokeless tobacco: Never Used  Substance and Sexual Activity  . Alcohol use: Never    Frequency: Never  . Drug use: Never  . Sexual activity: Not Currently  Lifestyle  . Physical activity:    Days per week: Not on file    Minutes per session: Not on file  . Stress: Not on file  Relationships  . Social connections:    Talks on phone: Not on file    Gets together: Not on file    Attends religious service: Not on file    Active member of club or organization: Not on file    Attends meetings of clubs or organizations: Not on file    Relationship status: Not on file  . Intimate partner violence:    Fear of current or ex partner: Not on file    Emotionally abused: Not on file    Physically abused: Not on file    Forced sexual activity: Not on file  Other Topics Concern  . Not on file  Social History Narrative  . Not on file    Past Surgical Hx:  Past Surgical History:  Procedure Laterality Date  . BREAST BIOPSY Right    ~15 years ago-2005  . OTHER SURGICAL HISTORY  2009   Uterine Polyp removal  . polyps removed from the uterus    . ROBOTIC ASSISTED TOTAL HYSTERECTOMY WITH BILATERAL SALPINGO OOPHERECTOMY Bilateral 05/07/2018   Procedure: XI ROBOTIC ASSISTED TOTAL HYSTERECTOMY WITH BILATERAL SALPINGO OOPHORECTOMY PELVIC LYMPHANECTOMY;  Surgeon: Everitt Amber, MD;  Location: [name];  Service: Gynecology;  Laterality: Bilateral;  . SENTINEL NODE BIOPSY Bilateral 05/07/2018   Procedure: SENTINEL NODE BIOPSY;  Surgeon: Everitt Amber, MD;   Location: [name];  Service: Gynecology;  Laterality: Bilateral;    Past Medical Hx:  Past Medical History:  Diagnosis Date  . Arthritis   . Cancer [facility])    endometrial cancer  . Diabetes mellitus without complication ([place])    type 2  . High cholesterol   . Hypertension   . Mini stroke ([place])    2011  . Pneumonia     Past Gynecological History:  See HPI No LMP recorded. Patient is postmenopausal.  Family Hx:  Family History  Problem Relation Age of Onset  . Lung cancer Mother   . Hypertension Father   . Pancreatic cancer Brother     Review of Systems:  Constitutional  Feels well,    ENT Normal appearing ears and  nares bilaterally Skin/Breast  No rash, sores, jaundice, itching, dryness Cardiovascular  No chest pain, shortness of breath, or edema  Pulmonary  No cough or wheeze.  Gastro Intestinal  No nausea, vomitting, or diarrhoea. No bright red blood per rectum, no abdominal pain, change in bowel movement, or constipation.  Genito Urinary  No frequency, urgency, dysuria, + postmenopausal bleeding Musculo Skeletal  No myalgia, arthralgia, joint swelling or pain  Neurologic  No weakness, numbness, change in gait,  Psychology  No depression, anxiety, insomnia.   Vitals:  Blood pressure 140/80, pulse (!) 132, temperature 98.2 F (36.8 C), temperature source Oral, resp. rate 20, height 5\' 3"  (1.6 m), weight 214 lb (97.1 kg), SpO2 95 %.  Physical Exam: WD in NAD Neck  Supple NROM, without any enlargements.  Lymph Node Survey No cervical supraclavicular or inguinal adenopathy Cardiovascular  Pulse normal rate, regularity and rhythm. S1 and S2 normal.  Lungs  Clear to auscultation bilateraly, without wheezes/crackles/rhonchi. Good air movement.  Skin  No rash/lesions/breakdown  Psychiatry  Alert and oriented to person, place, and time  Abdomen  Normoactive bowel sounds, abdomen soft, non-tender and obese without evidence of hernia. Incisions well  healed Back No CVA tenderness Genito Urinary  Vaginal cuff smooth and suture material no longer present. No blood, no lesions.  Rectal  deferred Extremities  No bilateral cyanosis, clubbing or edema.   Thereasa Solo, MD  06/05/2018, 2:46 PM

## 2018-06-05 ENCOUNTER — Encounter: Payer: Self-pay | Admitting: Gynecologic Oncology

## 2018-06-05 ENCOUNTER — Other Ambulatory Visit: Payer: Self-pay

## 2018-06-05 ENCOUNTER — Inpatient Hospital Stay: Payer: PPO | Attending: Gynecologic Oncology | Admitting: Gynecologic Oncology

## 2018-06-05 VITALS — BP 140/80 | HR 132 | Temp 98.2°F | Resp 20 | Ht 63.0 in | Wt 214.0 lb

## 2018-06-05 DIAGNOSIS — E119 Type 2 diabetes mellitus without complications: Secondary | ICD-10-CM | POA: Diagnosis not present

## 2018-06-05 DIAGNOSIS — Z90710 Acquired absence of both cervix and uterus: Secondary | ICD-10-CM | POA: Insufficient documentation

## 2018-06-05 DIAGNOSIS — Z90722 Acquired absence of ovaries, bilateral: Secondary | ICD-10-CM | POA: Insufficient documentation

## 2018-06-05 DIAGNOSIS — I10 Essential (primary) hypertension: Secondary | ICD-10-CM | POA: Diagnosis not present

## 2018-06-05 DIAGNOSIS — R609 Edema, unspecified: Secondary | ICD-10-CM | POA: Diagnosis not present

## 2018-06-05 DIAGNOSIS — E669 Obesity, unspecified: Secondary | ICD-10-CM | POA: Insufficient documentation

## 2018-06-05 DIAGNOSIS — E7800 Pure hypercholesterolemia, unspecified: Secondary | ICD-10-CM | POA: Diagnosis not present

## 2018-06-05 DIAGNOSIS — Z791 Long term (current) use of non-steroidal anti-inflammatories (NSAID): Secondary | ICD-10-CM | POA: Diagnosis not present

## 2018-06-05 DIAGNOSIS — Z6837 Body mass index (BMI) 37.0-37.9, adult: Secondary | ICD-10-CM | POA: Diagnosis not present

## 2018-06-05 DIAGNOSIS — R252 Cramp and spasm: Secondary | ICD-10-CM | POA: Diagnosis not present

## 2018-06-05 DIAGNOSIS — R945 Abnormal results of liver function studies: Secondary | ICD-10-CM | POA: Diagnosis not present

## 2018-06-05 DIAGNOSIS — C541 Malignant neoplasm of endometrium: Secondary | ICD-10-CM | POA: Insufficient documentation

## 2018-06-05 DIAGNOSIS — Z7982 Long term (current) use of aspirin: Secondary | ICD-10-CM | POA: Diagnosis not present

## 2018-06-05 NOTE — Patient Instructions (Signed)
Please notify Dr Denman George at phone number [phone] if you notice vaginal bleeding, new pelvic or abdominal pains, bloating, feeling full easy, or a change in bladder or bowel function.   Please contact Dr Serita Grit office (at [phone]) in May, 2020 to request an appointment with her for September, 2020.

## 2018-06-11 DIAGNOSIS — J069 Acute upper respiratory infection, unspecified: Secondary | ICD-10-CM | POA: Diagnosis not present

## 2018-06-11 DIAGNOSIS — Z6837 Body mass index (BMI) 37.0-37.9, adult: Secondary | ICD-10-CM | POA: Diagnosis not present

## 2018-08-18 DIAGNOSIS — R5383 Other fatigue: Secondary | ICD-10-CM | POA: Diagnosis not present

## 2018-08-18 DIAGNOSIS — R600 Localized edema: Secondary | ICD-10-CM | POA: Diagnosis not present

## 2018-08-18 DIAGNOSIS — R05 Cough: Secondary | ICD-10-CM | POA: Diagnosis not present

## 2018-08-18 DIAGNOSIS — R609 Edema, unspecified: Secondary | ICD-10-CM | POA: Diagnosis not present

## 2018-08-18 DIAGNOSIS — R0602 Shortness of breath: Secondary | ICD-10-CM | POA: Diagnosis not present

## 2018-08-18 DIAGNOSIS — I10 Essential (primary) hypertension: Secondary | ICD-10-CM | POA: Diagnosis not present

## 2018-09-30 DIAGNOSIS — R5383 Other fatigue: Secondary | ICD-10-CM | POA: Diagnosis not present

## 2018-10-05 DIAGNOSIS — Z6838 Body mass index (BMI) 38.0-38.9, adult: Secondary | ICD-10-CM | POA: Diagnosis not present

## 2018-10-05 DIAGNOSIS — R05 Cough: Secondary | ICD-10-CM | POA: Diagnosis not present

## 2018-10-05 DIAGNOSIS — R609 Edema, unspecified: Secondary | ICD-10-CM | POA: Diagnosis not present

## 2018-10-05 DIAGNOSIS — R5383 Other fatigue: Secondary | ICD-10-CM | POA: Diagnosis not present

## 2018-10-05 DIAGNOSIS — I10 Essential (primary) hypertension: Secondary | ICD-10-CM | POA: Diagnosis not present

## 2018-10-05 DIAGNOSIS — R110 Nausea: Secondary | ICD-10-CM | POA: Diagnosis not present

## 2018-10-05 DIAGNOSIS — E039 Hypothyroidism, unspecified: Secondary | ICD-10-CM | POA: Diagnosis not present

## 2018-10-13 DIAGNOSIS — Z90722 Acquired absence of ovaries, bilateral: Secondary | ICD-10-CM | POA: Diagnosis not present

## 2018-10-13 DIAGNOSIS — K8020 Calculus of gallbladder without cholecystitis without obstruction: Secondary | ICD-10-CM | POA: Diagnosis not present

## 2018-10-13 DIAGNOSIS — R110 Nausea: Secondary | ICD-10-CM | POA: Diagnosis not present

## 2018-10-13 DIAGNOSIS — R109 Unspecified abdominal pain: Secondary | ICD-10-CM | POA: Diagnosis not present

## 2018-10-13 DIAGNOSIS — N281 Cyst of kidney, acquired: Secondary | ICD-10-CM | POA: Diagnosis not present

## 2018-10-13 DIAGNOSIS — Z8542 Personal history of malignant neoplasm of other parts of uterus: Secondary | ICD-10-CM | POA: Diagnosis not present

## 2018-10-13 DIAGNOSIS — R188 Other ascites: Secondary | ICD-10-CM | POA: Diagnosis not present

## 2018-10-13 DIAGNOSIS — Z90710 Acquired absence of both cervix and uterus: Secondary | ICD-10-CM | POA: Diagnosis not present

## 2018-10-15 DIAGNOSIS — R188 Other ascites: Secondary | ICD-10-CM | POA: Diagnosis not present

## 2018-10-15 DIAGNOSIS — K7460 Unspecified cirrhosis of liver: Secondary | ICD-10-CM | POA: Diagnosis not present

## 2018-10-15 DIAGNOSIS — R609 Edema, unspecified: Secondary | ICD-10-CM | POA: Diagnosis not present

## 2018-10-15 DIAGNOSIS — Z6838 Body mass index (BMI) 38.0-38.9, adult: Secondary | ICD-10-CM | POA: Diagnosis not present

## 2018-10-23 ENCOUNTER — Encounter: Payer: Self-pay | Admitting: Gastroenterology

## 2018-10-25 ENCOUNTER — Inpatient Hospital Stay (HOSPITAL_COMMUNITY)
Admission: EM | Admit: 2018-10-25 | Discharge: 2018-11-05 | DRG: 683 | Disposition: A | Discharge status: Home / Self Care | Payer: PPO | Attending: Family Medicine | Admitting: Family Medicine

## 2018-10-25 ENCOUNTER — Emergency Department (HOSPITAL_COMMUNITY): Payer: PPO

## 2018-10-25 ENCOUNTER — Other Ambulatory Visit: Payer: Self-pay

## 2018-10-25 ENCOUNTER — Encounter (HOSPITAL_COMMUNITY): Payer: Self-pay | Admitting: Student

## 2018-10-25 DIAGNOSIS — I872 Venous insufficiency (chronic) (peripheral): Secondary | ICD-10-CM | POA: Diagnosis present

## 2018-10-25 DIAGNOSIS — Z8673 Personal history of transient ischemic attack (TIA), and cerebral infarction without residual deficits: Secondary | ICD-10-CM

## 2018-10-25 DIAGNOSIS — R0602 Shortness of breath: Secondary | ICD-10-CM

## 2018-10-25 DIAGNOSIS — I959 Hypotension, unspecified: Secondary | ICD-10-CM | POA: Diagnosis not present

## 2018-10-25 DIAGNOSIS — E876 Hypokalemia: Secondary | ICD-10-CM | POA: Diagnosis not present

## 2018-10-25 DIAGNOSIS — E8809 Other disorders of plasma-protein metabolism, not elsewhere classified: Secondary | ICD-10-CM | POA: Diagnosis not present

## 2018-10-25 DIAGNOSIS — Z90710 Acquired absence of both cervix and uterus: Secondary | ICD-10-CM

## 2018-10-25 DIAGNOSIS — C541 Malignant neoplasm of endometrium: Secondary | ICD-10-CM | POA: Diagnosis not present

## 2018-10-25 DIAGNOSIS — R188 Other ascites: Secondary | ICD-10-CM | POA: Diagnosis not present

## 2018-10-25 DIAGNOSIS — K7460 Unspecified cirrhosis of liver: Secondary | ICD-10-CM

## 2018-10-25 DIAGNOSIS — R34 Anuria and oliguria: Secondary | ICD-10-CM | POA: Diagnosis not present

## 2018-10-25 DIAGNOSIS — E039 Hypothyroidism, unspecified: Secondary | ICD-10-CM | POA: Diagnosis present

## 2018-10-25 DIAGNOSIS — Z7984 Long term (current) use of oral hypoglycemic drugs: Secondary | ICD-10-CM

## 2018-10-25 DIAGNOSIS — J90 Pleural effusion, not elsewhere classified: Secondary | ICD-10-CM

## 2018-10-25 DIAGNOSIS — Z20828 Contact with and (suspected) exposure to other viral communicable diseases: Secondary | ICD-10-CM | POA: Diagnosis not present

## 2018-10-25 DIAGNOSIS — Z800 Family history of malignant neoplasm of digestive organs: Secondary | ICD-10-CM

## 2018-10-25 DIAGNOSIS — Z90722 Acquired absence of ovaries, bilateral: Secondary | ICD-10-CM

## 2018-10-25 DIAGNOSIS — J9811 Atelectasis: Secondary | ICD-10-CM | POA: Diagnosis not present

## 2018-10-25 DIAGNOSIS — D649 Anemia, unspecified: Secondary | ICD-10-CM | POA: Diagnosis present

## 2018-10-25 DIAGNOSIS — Z7982 Long term (current) use of aspirin: Secondary | ICD-10-CM

## 2018-10-25 DIAGNOSIS — R9431 Abnormal electrocardiogram [ECG] [EKG]: Secondary | ICD-10-CM | POA: Diagnosis not present

## 2018-10-25 DIAGNOSIS — N179 Acute kidney failure, unspecified: Secondary | ICD-10-CM | POA: Diagnosis not present

## 2018-10-25 DIAGNOSIS — N390 Urinary tract infection, site not specified: Secondary | ICD-10-CM | POA: Diagnosis present

## 2018-10-25 DIAGNOSIS — E669 Obesity, unspecified: Secondary | ICD-10-CM | POA: Diagnosis present

## 2018-10-25 DIAGNOSIS — E875 Hyperkalemia: Secondary | ICD-10-CM | POA: Diagnosis not present

## 2018-10-25 DIAGNOSIS — E7800 Pure hypercholesterolemia, unspecified: Secondary | ICD-10-CM | POA: Diagnosis not present

## 2018-10-25 DIAGNOSIS — N170 Acute kidney failure with tubular necrosis: Secondary | ICD-10-CM | POA: Diagnosis not present

## 2018-10-25 DIAGNOSIS — Z6839 Body mass index (BMI) 39.0-39.9, adult: Secondary | ICD-10-CM | POA: Diagnosis not present

## 2018-10-25 DIAGNOSIS — Z8249 Family history of ischemic heart disease and other diseases of the circulatory system: Secondary | ICD-10-CM

## 2018-10-25 DIAGNOSIS — E869 Volume depletion, unspecified: Secondary | ICD-10-CM | POA: Diagnosis not present

## 2018-10-25 DIAGNOSIS — L308 Other specified dermatitis: Secondary | ICD-10-CM | POA: Diagnosis present

## 2018-10-25 DIAGNOSIS — G459 Transient cerebral ischemic attack, unspecified: Secondary | ICD-10-CM | POA: Insufficient documentation

## 2018-10-25 DIAGNOSIS — R601 Generalized edema: Secondary | ICD-10-CM | POA: Diagnosis not present

## 2018-10-25 DIAGNOSIS — K7469 Other cirrhosis of liver: Secondary | ICD-10-CM | POA: Diagnosis not present

## 2018-10-25 DIAGNOSIS — K7581 Nonalcoholic steatohepatitis (NASH): Secondary | ICD-10-CM | POA: Diagnosis present

## 2018-10-25 DIAGNOSIS — E8770 Fluid overload, unspecified: Secondary | ICD-10-CM | POA: Diagnosis not present

## 2018-10-25 DIAGNOSIS — E871 Hypo-osmolality and hyponatremia: Secondary | ICD-10-CM | POA: Diagnosis not present

## 2018-10-25 DIAGNOSIS — E119 Type 2 diabetes mellitus without complications: Secondary | ICD-10-CM | POA: Diagnosis not present

## 2018-10-25 DIAGNOSIS — Z79899 Other long term (current) drug therapy: Secondary | ICD-10-CM

## 2018-10-25 DIAGNOSIS — Z801 Family history of malignant neoplasm of trachea, bronchus and lung: Secondary | ICD-10-CM

## 2018-10-25 DIAGNOSIS — I471 Supraventricular tachycardia, unspecified: Secondary | ICD-10-CM

## 2018-10-25 DIAGNOSIS — I459 Conduction disorder, unspecified: Secondary | ICD-10-CM | POA: Diagnosis present

## 2018-10-25 DIAGNOSIS — I10 Essential (primary) hypertension: Secondary | ICD-10-CM

## 2018-10-25 DIAGNOSIS — I639 Cerebral infarction, unspecified: Secondary | ICD-10-CM | POA: Insufficient documentation

## 2018-10-25 DIAGNOSIS — D631 Anemia in chronic kidney disease: Secondary | ICD-10-CM | POA: Diagnosis not present

## 2018-10-25 DIAGNOSIS — Z79890 Hormone replacement therapy (postmenopausal): Secondary | ICD-10-CM

## 2018-10-25 DIAGNOSIS — R896 Abnormal cytological findings in specimens from other organs, systems and tissues: Secondary | ICD-10-CM | POA: Diagnosis not present

## 2018-10-25 DIAGNOSIS — E785 Hyperlipidemia, unspecified: Secondary | ICD-10-CM | POA: Diagnosis present

## 2018-10-25 HISTORY — DX: Disorder of kidney and ureter, unspecified: N28.9

## 2018-10-25 HISTORY — DX: Unspecified cirrhosis of liver: K74.60

## 2018-10-25 LAB — CBC WITH DIFFERENTIAL/PLATELET
Abs Immature Granulocytes: 0.04 10*3/uL (ref 0.00–0.07)
Basophils Absolute: 0.1 10*3/uL (ref 0.0–0.1)
Basophils Relative: 1 %
Eosinophils Absolute: 0.4 10*3/uL (ref 0.0–0.5)
Eosinophils Relative: 5 %
HCT: 34.2 % — ABNORMAL LOW (ref 36.0–46.0)
Hemoglobin: 11.3 g/dL — ABNORMAL LOW (ref 12.0–15.0)
Immature Granulocytes: 0 %
Lymphocytes Relative: 19 %
Lymphs Abs: 1.8 10*3/uL (ref 0.7–4.0)
MCH: 32.8 pg (ref 26.0–34.0)
MCHC: 33 g/dL (ref 30.0–36.0)
MCV: 99.1 fL (ref 80.0–100.0)
Monocytes Absolute: 1.8 10*3/uL — ABNORMAL HIGH (ref 0.1–1.0)
Monocytes Relative: 19 %
Neutro Abs: 5.3 10*3/uL (ref 1.7–7.7)
Neutrophils Relative %: 56 %
Platelets: 213 10*3/uL (ref 150–400)
RBC: 3.45 MIL/uL — ABNORMAL LOW (ref 3.87–5.11)
RDW: 15.1 % (ref 11.5–15.5)
WBC: 9.5 10*3/uL (ref 4.0–10.5)
nRBC: 0 % (ref 0.0–0.2)

## 2018-10-25 LAB — COMPREHENSIVE METABOLIC PANEL
ALT: 14 U/L (ref 0–44)
AST: 29 U/L (ref 15–41)
Albumin: 2.5 g/dL — ABNORMAL LOW (ref 3.5–5.0)
Alkaline Phosphatase: 53 U/L (ref 38–126)
Anion gap: 12 (ref 5–15)
BUN: 70 mg/dL — ABNORMAL HIGH (ref 8–23)
CO2: 22 mmol/L (ref 22–32)
Calcium: 8.2 mg/dL — ABNORMAL LOW (ref 8.9–10.3)
Chloride: 100 mmol/L (ref 98–111)
Creatinine, Ser: 3.94 mg/dL — ABNORMAL HIGH (ref 0.44–1.00)
GFR calc Af Amer: 12 mL/min — ABNORMAL LOW (ref >60)
GFR calc non Af Amer: 11 mL/min — ABNORMAL LOW (ref >60)
Glucose, Bld: 124 mg/dL — ABNORMAL HIGH (ref 70–99)
Potassium: 3.9 mmol/L (ref 3.5–5.1)
Sodium: 134 mmol/L — ABNORMAL LOW (ref 135–145)
Total Bilirubin: 3.1 mg/dL — ABNORMAL HIGH (ref 0.3–1.2)
Total Protein: 6.5 g/dL (ref 6.5–8.1)

## 2018-10-25 LAB — BRAIN NATRIURETIC PEPTIDE: B Natriuretic Peptide: 93 pg/mL (ref 0.0–100.0)

## 2018-10-25 LAB — URINALYSIS, ROUTINE W REFLEX MICROSCOPIC
Bilirubin Urine: NEGATIVE
Glucose, UA: NEGATIVE mg/dL
Ketones, ur: NEGATIVE mg/dL
Nitrite: NEGATIVE
Protein, ur: NEGATIVE mg/dL
Specific Gravity, Urine: 1.013 (ref 1.005–1.030)
pH: 5 (ref 5.0–8.0)

## 2018-10-25 LAB — GLUCOSE, CAPILLARY: Glucose-Capillary: 79 mg/dL (ref 70–99)

## 2018-10-25 LAB — MAGNESIUM: Magnesium: 2.4 mg/dL (ref 1.7–2.4)

## 2018-10-25 MED ORDER — FUROSEMIDE 10 MG/ML IJ SOLN
80.0000 mg | Freq: Once | INTRAMUSCULAR | Status: AC
  Administered 2018-10-25: 80 mg via INTRAVENOUS
  Filled 2018-10-25: qty 8

## 2018-10-25 MED ORDER — NYSTATIN 100000 UNIT/GM EX POWD
Freq: Three times a day (TID) | CUTANEOUS | Status: DC
  Administered 2018-10-26 – 2018-11-05 (×29): via TOPICAL
  Filled 2018-10-25 (×4): qty 15

## 2018-10-25 MED ORDER — LEVOTHYROXINE SODIUM 75 MCG PO TABS
75.0000 ug | ORAL_TABLET | Freq: Every day | ORAL | Status: DC
  Administered 2018-10-28 – 2018-11-05 (×9): 75 ug via ORAL
  Filled 2018-10-25 (×10): qty 1

## 2018-10-25 MED ORDER — ONDANSETRON HCL 4 MG PO TABS: 4.0000 mg | ORAL_TABLET | Freq: Four times a day (QID) | ORAL | Status: DC | PRN

## 2018-10-25 MED ORDER — SODIUM CHLORIDE 0.9 % IV SOLN: INTRAVENOUS | Status: DC

## 2018-10-25 MED ORDER — INSULIN ASPART 100 UNIT/ML ~~LOC~~ SOLN
0.0000 [IU] | Freq: Three times a day (TID) | SUBCUTANEOUS | Status: DC
  Administered 2018-10-28: 1 [IU] via SUBCUTANEOUS
  Administered 2018-10-29 (×2): 2 [IU] via SUBCUTANEOUS
  Administered 2018-10-30: 10:00:00 3 [IU] via SUBCUTANEOUS
  Administered 2018-10-30 – 2018-10-31 (×2): 2 [IU] via SUBCUTANEOUS
  Administered 2018-11-01: 3 [IU] via SUBCUTANEOUS
  Administered 2018-11-01: 2 [IU] via SUBCUTANEOUS
  Administered 2018-11-02 (×2): 3 [IU] via SUBCUTANEOUS
  Administered 2018-11-03: 17:00:00 1 [IU] via SUBCUTANEOUS
  Administered 2018-11-03 – 2018-11-05 (×3): 2 [IU] via SUBCUTANEOUS
  Administered 2018-11-05: 3 [IU] via SUBCUTANEOUS

## 2018-10-25 MED ORDER — ASPIRIN EC 81 MG PO TBEC
81.0000 mg | DELAYED_RELEASE_TABLET | Freq: Every day | ORAL | Status: DC
  Administered 2018-10-26 – 2018-11-04 (×11): 81 mg via ORAL
  Filled 2018-10-25 (×11): qty 1

## 2018-10-25 MED ORDER — SODIUM CHLORIDE 0.9 % IV SOLN
1.0000 g | INTRAVENOUS | Status: DC
  Administered 2018-10-26 – 2018-11-01 (×7): 1 g via INTRAVENOUS
  Filled 2018-10-25 (×7): qty 10

## 2018-10-25 MED ORDER — SODIUM CHLORIDE 0.9 % IV SOLN
1.0000 g | Freq: Once | INTRAVENOUS | Status: AC
  Administered 2018-10-25: 1 g via INTRAVENOUS
  Filled 2018-10-25: qty 10

## 2018-10-25 MED ORDER — ENOXAPARIN SODIUM 30 MG/0.3ML ~~LOC~~ SOLN
30.0000 mg | SUBCUTANEOUS | Status: DC
  Administered 2018-10-26 – 2018-11-05 (×11): 30 mg via SUBCUTANEOUS
  Filled 2018-10-25 (×11): qty 0.3

## 2018-10-25 MED ORDER — SODIUM CHLORIDE 0.9 % IV BOLUS
1000.0000 mL | Freq: Once | INTRAVENOUS | Status: AC
  Administered 2018-10-25: 1000 mL via INTRAVENOUS

## 2018-10-25 MED ORDER — INSULIN ASPART 100 UNIT/ML ~~LOC~~ SOLN: 0.0000 [IU] | Freq: Every day | SUBCUTANEOUS | Status: DC

## 2018-10-25 MED ORDER — SIMVASTATIN 20 MG PO TABS
20.0000 mg | ORAL_TABLET | Freq: Every day | ORAL | Status: DC
  Administered 2018-10-26 (×2): 20 mg via ORAL
  Filled 2018-10-25 (×2): qty 1

## 2018-10-25 MED ORDER — INSULIN GLARGINE 100 UNIT/ML ~~LOC~~ SOLN
10.0000 [IU] | Freq: Every day | SUBCUTANEOUS | Status: DC
  Administered 2018-10-26: 10 [IU] via SUBCUTANEOUS
  Filled 2018-10-25 (×2): qty 0.1

## 2018-10-25 MED ORDER — ONDANSETRON HCL 4 MG/2ML IJ SOLN
4.0000 mg | Freq: Four times a day (QID) | INTRAMUSCULAR | Status: DC | PRN
  Administered 2018-11-01: 4 mg via INTRAVENOUS
  Filled 2018-10-25: qty 2

## 2018-10-25 MED ORDER — SODIUM CHLORIDE 0.9 % IV SOLN: Freq: Once | INTRAVENOUS | Status: DC

## 2018-10-25 MED ORDER — POTASSIUM CHLORIDE CRYS ER 20 MEQ PO TBCR
20.0000 meq | EXTENDED_RELEASE_TABLET | Freq: Once | ORAL | Status: AC
  Administered 2018-10-25: 20 meq via ORAL
  Filled 2018-10-25: qty 1

## 2018-10-25 MED ORDER — SODIUM CHLORIDE 0.9 % IV SOLN
INTRAVENOUS | Status: DC
  Administered 2018-10-26 (×2): via INTRAVENOUS

## 2018-10-25 NOTE — ED Provider Notes (Addendum)
[facility] EMERGENCY DEPARTMENT Provider Note   CSN: [id number] Arrival date & time: 10/25/18  1601     History   Chief Complaint Chief Complaint  Patient presents with   Shortness of Breath   Leg Swelling    HPI Jordan Le is a 73 y.o. female with a hx of HTN, hypercholesterolemia, & endometrial cancer s/p total total abdominal hysterectomy & bilateral salpingo-oophorectomy w/o need for adjuvent therapy currently who presents to the ED w/ progressively worsening edema to the lower extremities & abdomen w/ dyspnea for the past 3-4 months. Patient states she has constant, progressively worsening, edema to the bilateral lower extremities up to her abdomen. She feels dyspneic most notably with exertion & when in the supine position, otherwise no significant alleviating/aggravating factors to her sxs. Notes a dry cough at times. She has seen her PCP for her sxs, was started on Lasix, the dose was increased, she had an additional unknown fluid pill added to her regimen each of with without relief. She states she was told @ 1 point she had fatty liver & also at another point that she had abnormal kidney function, she is not a dialysis patient. She states that she believes they said she did not have HF, but she does not recall having an echo performed. Denies fever, chills, chest pain, abdominal pain, or vomiting. Prior to a few months ago no other similar sxs in the past.     HPI  Past Medical History:  Diagnosis Date   Arthritis    Cancer ([place])    endometrial cancer   Diabetes mellitus without complication ([place])    type 2   High cholesterol    Hypertension    Mini stroke ([place])    2011   Pneumonia     Patient Active Problem List   Diagnosis Date Noted   Endometrial cancer ([place]) 05/07/2018   Urinary tract infection 05/07/2018    Past Surgical History:  Procedure Laterality Date   BREAST BIOPSY Right    ~15 years ago-2005   OTHER SURGICAL HISTORY  2009   Uterine Polyp removal   polyps removed from the uterus     ROBOTIC ASSISTED TOTAL HYSTERECTOMY WITH BILATERAL SALPINGO OOPHERECTOMY Bilateral 05/07/2018   Procedure: XI ROBOTIC [place];  Surgeon: Everitt Amber, MD;  Location: [name];  Service: Gynecology;  Laterality: Bilateral;   SENTINEL NODE BIOPSY Bilateral 05/07/2018   Procedure: SENTINEL NODE BIOPSY;  Surgeon: Everitt Amber, MD;  Location: [name];  Service: Gynecology;  Laterality: Bilateral;     OB History   No obstetric history on file.      Home Medications    Prior to Admission medications   Medication Sig Start Date End Date Taking? Authorizing Provider  aspirin EC 81 MG tablet Take 81 mg by mouth at bedtime.     [provider]  ibuprofen (ADVIL,MOTRIN) 200 MG tablet Take 400 mg by mouth daily as needed for fever, headache or moderate pain.    [provider]  losartan (COZAAR) 25 MG tablet Take 25 mg by mouth daily.    [provider]  metFORMIN (GLUCOPHAGE-XR) 500 MG 24 hr tablet Take 500 mg by mouth daily with breakfast.    [provider]  simvastatin (ZOCOR) 20 MG tablet Take 20 mg by mouth at bedtime.     [provider]    Family History Family History  Problem Relation Age of Onset   Lung  cancer Mother    Hypertension Father    Pancreatic cancer Brother     Social History Social History   Tobacco Use   Smoking status: Never Smoker   Smokeless tobacco: Never Used  Substance Use Topics   Alcohol use: Never    Frequency: Never   Drug use: Never     Allergies   Patient has no known allergies.   Review of Systems Review of Systems  Constitutional: Negative for chills and fever.  HENT: Negative for congestion, ear pain and sore throat.   Respiratory: Positive for cough and shortness of breath.   Cardiovascular: Positive for leg swelling. Negative for chest pain.    Gastrointestinal: Positive for abdominal distention. Negative for abdominal pain, blood in stool, nausea and vomiting.  Genitourinary: Negative for dysuria.  Neurological: Negative for syncope.  All other systems reviewed and are negative.    Physical Exam Vital Signs BP (!) 130/45 (BP Location: Left Arm)    Pulse 95    Temp 98.4 F (36.9 C) (Oral)    Resp 20    Ht 5\' 3"  (1.6 m)    Wt 97.5 kg    SpO2 97%    BMI 38.09 kg/m   Physical Exam Vitals signs and nursing note reviewed.  Constitutional:      General: She is not in acute distress.    Appearance: She is well-developed. She is not toxic-appearing.  HENT:     Head: Normocephalic and atraumatic.  Eyes:     General:        Right eye: No discharge.        Left eye: No discharge.     Conjunctiva/sclera: Conjunctivae normal.  Neck:     Musculoskeletal: Neck supple.  Cardiovascular:     Rate and Rhythm: Normal rate and regular rhythm.  Pulmonary:     Effort: Pulmonary effort is normal. No respiratory distress.     Breath sounds: Decreased breath sounds (bibasilar) present. No wheezing or rhonchi.  Abdominal:     General: There is distension.     Palpations: Abdomen is soft.     Tenderness: There is no abdominal tenderness. There is no guarding or rebound.  Musculoskeletal:     Right lower leg: Edema present.     Left lower leg: Edema present.     Comments: Patient has 4+ symmetric pitting edema to the lower legs. Edema extends to the abdomen. Mild erythema to the left anterior lower leg that is not warm to the touch, no fluctuance. LEs are nontender.   Skin:    General: Skin is warm and dry.  Neurological:     Mental Status: She is alert.     Comments: Clear speech.   Psychiatric:        Behavior: Behavior normal.    ED Treatments / Results  Labs (all labs ordered are listed, but only abnormal results are displayed) Labs Reviewed  COMPREHENSIVE METABOLIC PANEL - Abnormal; Notable for the following components:       Result Value   Sodium 134 (*)    Glucose, Bld 124 (*)    BUN 70 (*)    Creatinine, Ser 3.94 (*)    Calcium 8.2 (*)    Albumin 2.5 (*)    Total Bilirubin 3.1 (*)    GFR calc non Af Amer 11 (*)    GFR calc Af Amer 12 (*)    All other components within normal limits  CBC WITH DIFFERENTIAL/PLATELET - Abnormal; Notable for the  following components:   RBC 3.45 (*)    Hemoglobin 11.3 (*)    HCT 34.2 (*)    Monocytes Absolute 1.8 (*)    All other components within normal limits  SARS CORONAVIRUS 2  BRAIN NATRIURETIC PEPTIDE  MAGNESIUM  URINALYSIS, ROUTINE W REFLEX MICROSCOPIC    EKG EKG Interpretation  Date/Time:  Sunday October 25 2018 16:53:19 EDT Ventricular Rate:  95 PR Interval:    QRS Duration: 102 QT Interval:  416 QTC Calculation: 523 R Axis:   95 Text Interpretation:  Sinus rhythm Prolonged PR interval Right axis deviation Low voltage, extremity leads Anteroseptal infarct, old Prolonged QT interval since last tracing no significant change Confirmed by Noemi Chapel [phone]) on 10/25/2018 5:04:39 PM   Radiology Ct Abdomen Pelvis Wo Contrast  Result Date: 10/25/2018 CLINICAL DATA:  Acute renal injury, history of prior hysterectomy EXAM: CT ABDOMEN AND PELVIS WITHOUT CONTRAST TECHNIQUE: Multidetector CT imaging of the abdomen and pelvis was performed following the standard protocol without IV contrast. COMPARISON:  04/22/2018 FINDINGS: Lower chest: Large left pleural effusion is noted with underlying left basilar atelectasis. The right lung base is clear. Hepatobiliary: Shrunken liver with nodular contour consistent with underlying cirrhosis. No focal mass is noted. The gallbladder is well distended with dependent gallstones similar to that noted on the prior exam. Pancreas: Unremarkable. No pancreatic ductal dilatation or surrounding inflammatory changes. Spleen: Normal in size without focal abnormality. Adrenals/Urinary Tract: Adrenal glands are within normal limits. Small exophytic  cyst is noted arising from the right kidney similar to that seen on prior exam. No renal calculi or obstructive changes are seen. The bladder is decompressed by Foley catheter. Stomach/Bowel: Appendix is not well visualized although no inflammatory changes are seen. No obstructive or inflammatory changes of large or small bowel are seen. The stomach is within normal limits. Vascular/Lymphatic: Aortic atherosclerosis. No enlarged abdominal or pelvic lymph nodes. Reproductive: Uterus is been surgically removed consistent with the given clinical history. Other: Significant ascites is noted new from the prior exam. This may be related to the underlying portal hypertension. Musculoskeletal: No acute or significant osseous findings. IMPRESSION: Changes of hepatic cirrhosis and significant ascites which is new from the prior exam. New moderate left-sided pleural effusion. Electronically Signed   By: Inez Catalina M.D.   On: 10/25/2018 21:34   Dg Chest 2 View  Result Date: 10/25/2018 CLINICAL DATA:  Shortness of breath EXAM: CHEST - 2 VIEW COMPARISON:  Chest CT April 22, 2018 FINDINGS: There is airspace consolidation in the left lower lobe with small left pleural effusion. The lungs elsewhere are clear. Heart size and pulmonary vascularity are normal. No adenopathy. No bone lesions. IMPRESSION: Left lower lobe airspace consolidation, felt to represent a degree of pneumonia. Small left pleural effusion. Lungs elsewhere clear. Cardiac silhouette normal. No adenopathy. Followup PA and lateral chest radiographs recommended in 3-4 weeks following trial of antibiotic therapy to ensure resolution and exclude underlying malignancy. Electronically Signed   By: Lowella Grip III M.D.   On: 10/25/2018 17:31   Procedures Procedures (including critical care time)  Medications Ordered in ED Medications  0.9 %  sodium chloride infusion (has no administration in time range)  furosemide (LASIX) injection 80 mg (80 mg  Intravenous Given 10/25/18 1801)  potassium chloride SA (K-DUR) CR tablet 20 mEq (20 mEq Oral Given 10/25/18 1801)    Initial Impression / Assessment and Plan / ED Course  I have reviewed the triage vital signs and the nursing notes.  Pertinent  labs & imaging results that were available during my care of the patient were reviewed by me and considered in my medical decision making (see chart for details).   Patient presents to the ED w/ complaints of edema & dyspnea. Nontoxic appearing, vitals WNL, diastolic BP a bit soft. Patient with anasarca on exam. Has some decreased breath sounds, but does not appear in respiratory distress. Labs, CXR, EKG. Will administer IV Lasix currently per discussion w/ Dr. Sabra Heck. Anticipate admission for anasarca for diuresis.   CBC: Mild anemia. No leukocytosis.  CMP: Significant AKI- creatinine 3.94 BUN 70- increased from 0.55 & 9 respectively five months ago. Mild electrolyte abnormalities as above. Hypoalbuminemia. LFTs WNL.  Mg: WNL BNP: WNL EKG: No significant change since last tracing CXR: Left lower lobe airspace consolidation, felt to represent a degree of pneumonia. Small left pleural effusion. Lungs elsewhere clear. Cardiac silhouette normal. No adenopathy. Followup PA and lateral chest radiographs recommended in 3-4 weeks following trial of antibiotic therapy to ensure resolution and exclude underlying malignancy.---> clinically have low suspicion for pneumonia, afebrile, no leukocytosis, will defer abx to admitting team.   Patient with degree of hypotension, remaining fairly consistent throughout ER stay, she is not tachycardic or febrile.   Patient with significant AKI & fluid overload. Urine pending. Has received lasix, will start maintenance fluids in the ED per discussion w/ Dr. Sabra Heck. Feel she requires admission for diuresis & AKI management.   19:00: CONSULT: Discussed with hospitalist Dr. Nehemiah Settle- requesting abdominal imaging, preferably CT w  contrast but given renal function not amenable, to further assess for possible ureteral injury given her surgery and poor urine output. Has not urinated s/p Lasix, states she urinates on average 2-3 times per day. He is also requesting we stop her fluids. Call back with results, will not see patient until after imaging results, appreciate input.   Fluids stopped @ Dr. Glenna Durand request.  CT abdomen/pelvis wo contrast ordered.   20:00: Patient continues to be unable to urinate. Given she is anuric w/ AKI & fluid overload will place foley catheter to monitor urine output.   21:25: Per nursing staff < 50 cc urine output with foley insertion, re-discussed w/ Dr. Sabra Heck, will restart maintenance fluids in the ED given anuria.   UA: Many bacteria, small leuks, small hgb--> culture added, start rocephin for coverage.    CT abdomen/pelvis: Changes of hepatic cirrhosis and significant ascites which is new from the prior exam. New moderate left-sided pleural effusion.  21:50: CONSULT: Re-discussed with Dr. Nehemiah Settle- in agreement with fluids, accepts admission.   This is a shared visit with supervising physician Dr. Sabra Heck who has independently evaluated patient & provided guidance in evaluation/management/disposition, in agreement with care    Final Clinical Impressions(s) / ED Diagnoses   Final diagnoses:  Acute renal failure, unspecified acute renal failure type ([place])  Anasarca  Cirrhosis of liver with ascites, unspecified hepatic cirrhosis type [facility])  Pleural effusion on left    ED Discharge Orders    None       Amaryllis Dyke, PA-C 10/26/18 0008    Amaryllis Dyke, PA-C 10/26/18 0008    Noemi Chapel, MD 10/27/18 2030

## 2018-10-25 NOTE — ED Triage Notes (Addendum)
Patient c/o shortness of breath with ascites and +3 pitting edema to lower extremities bilaterally. Patient reports increased swelling x2 months in which she has seen her PCP x3. Per patient placed on lasix first, lasix increased after second visit, and then placed on different "fluid pill" with lasix third visit. Patient unsure of the new medications name. Patient denies any improvement with medication. Patient reports being checked for CHF in which was negative. Patient told "fatty liver" but then told that she had kidney failure and not "fatty liver."

## 2018-10-25 NOTE — H&P (Signed)
History and Physical  Jordan Le XHB:[id number] DOB: 09/26/1945 DOA: 10/25/2018  Referring physician: Kennith Maes, PA-C, ED provider PCP: Manon Hilding, MD  Outpatient Specialists: [facility] [phone] Onc)  Patient Coming From: home  Chief Complaint: SOB  HPI: Jordan Le is a 73 y.o. female with a history of type 2 diabetes, hypertension, hyperlipidemia, cirrhosis of the liver, endometrial cancer status post hysterectomy in February.  Over the past couple of months, the patient has experienced increasing shortness of breath that is worse with exertion.  She is also had steadily increasing peripheral edema with increasing abdominal distention.  Shortness of breath is worse with ambulation and improved with rest.  Denies cough, wheezing, sputum production.  No fevers or chills.  She does have minimal urine output and urinates a small amount 2-3 times a day.  She notes diminished appetite and is not able to drink a lot of fluid because she feels full all the time.  Emergency Department Course: COVID pending.  CBC normal.  BNP normal.  UA shows UTI.  CT of the abdomen shows significant ascites with new left sided pleural effusion.  Chest x-ray shows the small left pleural effusion and possible left lower lobe airspace consolidation.  Review of Systems:   Pt denies any fevers, chills, nausea, vomiting, diarrhea, constipation, abdominal pain, palpitations, headache, vision changes, lightheadedness, dizziness, melena, rectal bleeding.  Review of systems are otherwise negative  Past Medical History:  Diagnosis Date  . Arthritis   . Cancer [facility])    endometrial cancer  . Cirrhosis of liver ([place])   . Diabetes mellitus without complication ([place])    type 2  . High cholesterol   . Hypertension   . Mini stroke ([place])    2011  . Pneumonia   . Renal disorder    Past Surgical History:  Procedure Laterality Date  . ABDOMINAL HYSTERECTOMY    . BREAST BIOPSY Right    ~15 years ago-2005  .  OTHER SURGICAL HISTORY  2009   Uterine Polyp removal  . polyps removed from the uterus    . ROBOTIC ASSISTED TOTAL HYSTERECTOMY WITH BILATERAL SALPINGO OOPHERECTOMY Bilateral 05/07/2018   Procedure: XI ROBOTIC ASSISTED TOTAL HYSTERECTOMY WITH BILATERAL SALPINGO OOPHORECTOMY PELVIC LYMPHANECTOMY;  Surgeon: Everitt Amber, MD;  Location: [name];  Service: Gynecology;  Laterality: Bilateral;  . SENTINEL NODE BIOPSY Bilateral 05/07/2018   Procedure: SENTINEL NODE BIOPSY;  Surgeon: Everitt Amber, MD;  Location: [name];  Service: Gynecology;  Laterality: Bilateral;   Social History:  reports that she has never smoked. She has never used smokeless tobacco. She reports that she does not drink alcohol or use drugs. Patient lives at home  No Known Allergies  Family History  Problem Relation Age of Onset  . Lung cancer Mother   . Hypertension Father   . Pancreatic cancer Brother       Prior to Admission medications   Medication Sig Start Date End Date Taking? Authorizing Provider  aspirin EC 81 MG tablet Take 81 mg by mouth at bedtime.    Yes [provider]  furosemide (LASIX) 40 MG tablet Take 40 mg by mouth daily.  10/05/18 10/30/19 Yes [provider]  ibuprofen (ADVIL,MOTRIN) 200 MG tablet Take 400 mg by mouth daily as needed for fever, headache or moderate pain.   Yes [provider]  levothyroxine (SYNTHROID) 75 MCG tablet Take 75 mcg by mouth daily before breakfast.  10/05/18 10/30/19 Yes [provider]  losartan (COZAAR) 25 MG  tablet Take 25 mg by mouth daily.   Yes [provider]  metFORMIN (GLUCOPHAGE-XR) 500 MG 24 hr tablet Take 500 mg by mouth daily with breakfast.   Yes [provider]  simvastatin (ZOCOR) 20 MG tablet Take 20 mg by mouth at bedtime.    Yes [provider]  spironolactone (ALDACTONE) 25 MG tablet Take 25 mg by mouth daily.  10/15/18 05/13/19 Yes [provider]    Physical Exam: BP (!) 103/42   Pulse 95    Temp 98.4 F (36.9 C) (Oral)   Resp (!) 29   Ht 5\' 3"  (1.6 m)   Wt 97.5 kg   SpO2 96%   BMI 38.09 kg/m   . General: Elderly Caucasian female. Awake and alert and oriented x3. No acute cardiopulmonary distress.  Marland Kitchen HEENT: Normocephalic atraumatic.  Right and left ears normal in appearance.  Pupils equal, round, reactive to light. Extraocular muscles are intact. Sclerae anicteric and noninjected.  Moist mucosal membranes. No mucosal lesions.  . Neck: Neck supple without lymphadenopathy. No carotid bruits. No masses palpated.  . Cardiovascular: Regular rate with normal S1-S2 sounds. No murmurs, rubs, gallops auscultated. No JVD.  Marland Kitchen Respiratory: Diminished breath sounds in the left base.  No rales or rhonchi.  No accessory muscle use. . Abdomen: Soft, nontender.  There is a significant abdominal distention with hypertympany.  Active bowel sounds. No masses or hepatosplenomegaly  . Skin: No rashes, lesions, or ulcerations.  Dry, warm to touch. 2+ dorsalis pedis and radial pulses. . Musculoskeletal: No calf or leg pain. All major joints not erythematous nontender.  No upper or lower joint deformation.  Good ROM.  No contractures  . Psychiatric: Intact judgment and insight. Pleasant and cooperative. . Neurologic: No focal neurological deficits. Strength is 5/5 and symmetric in upper and lower extremities.  Cranial nerves II through XII are grossly intact.           Labs on Admission: I have personally reviewed following labs and imaging studies  CBC: Recent Labs  Lab 10/25/18 1726  WBC 9.5  NEUTROABS 5.3  HGB 11.3*  HCT 34.2*  MCV 99.1  PLT 696   Basic Metabolic Panel: Recent Labs  Lab 10/25/18 1726  NA 134*  K 3.9  CL 100  CO2 22  GLUCOSE 124*  BUN 70*  CREATININE 3.94*  CALCIUM 8.2*  MG 2.4   GFR: Estimated Creatinine Clearance: 13.9 mL/min (A) (by C-G formula based on SCr of 3.94 mg/dL (H)). Liver Function Tests: Recent Labs  Lab 10/25/18 1726  AST 29  ALT 14   ALKPHOS 53  BILITOT 3.1*  PROT 6.5  ALBUMIN 2.5*   No results for input(s): LIPASE, AMYLASE in the last 168 hours. No results for input(s): AMMONIA in the last 168 hours. Coagulation Profile: No results for input(s): INR, PROTIME in the last 168 hours. Cardiac Enzymes: No results for input(s): CKTOTAL, CKMB, CKMBINDEX, TROPONINI in the last 168 hours. BNP (last 3 results) No results for input(s): PROBNP in the last 8760 hours. HbA1C: No results for input(s): HGBA1C in the last 72 hours. CBG: No results for input(s): GLUCAP in the last 168 hours. Lipid Profile: No results for input(s): CHOL, HDL, LDLCALC, TRIG, CHOLHDL, LDLDIRECT in the last 72 hours. Thyroid Function Tests: No results for input(s): TSH, T4TOTAL, FREET4, T3FREE, THYROIDAB in the last 72 hours. Anemia Panel: No results for input(s): VITAMINB12, FOLATE, FERRITIN, TIBC, IRON, RETICCTPCT in the last 72 hours. Urine analysis:    Component  Value Date/Time   COLORURINE AMBER (A) 10/25/2018 2100   APPEARANCEUR CLOUDY (A) 10/25/2018 2100   LABSPEC 1.013 10/25/2018 2100   PHURINE 5.0 10/25/2018 2100   GLUCOSEU NEGATIVE 10/25/2018 2100   HGBUR SMALL (A) 10/25/2018 2100   [place] NEGATIVE 10/25/2018 2100   [place] NEGATIVE 10/25/2018 2100   PROTEINUR NEGATIVE 10/25/2018 2100   NITRITE NEGATIVE 10/25/2018 2100   LEUKOCYTESUR SMALL (A) 10/25/2018 2100   Sepsis Labs: @LABRCNTIP (procalcitonin:4,lacticidven:4) )No results found for this or any previous visit (from the past 240 hour(s)).   Radiological Exams on Admission: Ct Abdomen Pelvis Wo Contrast  Result Date: 10/25/2018 CLINICAL DATA:  Acute renal injury, history of prior hysterectomy EXAM: CT ABDOMEN AND PELVIS WITHOUT CONTRAST TECHNIQUE: Multidetector CT imaging of the abdomen and pelvis was performed following the standard protocol without IV contrast. COMPARISON:  04/22/2018 FINDINGS: Lower chest: Large left pleural effusion is noted with underlying left  basilar atelectasis. The right lung base is clear. Hepatobiliary: Shrunken liver with nodular contour consistent with underlying cirrhosis. No focal mass is noted. The gallbladder is well distended with dependent gallstones similar to that noted on the prior exam. Pancreas: Unremarkable. No pancreatic ductal dilatation or surrounding inflammatory changes. Spleen: Normal in size without focal abnormality. Adrenals/Urinary Tract: Adrenal glands are within normal limits. Small exophytic cyst is noted arising from the right kidney similar to that seen on prior exam. No renal calculi or obstructive changes are seen. The bladder is decompressed by Foley catheter. Stomach/Bowel: Appendix is not well visualized although no inflammatory changes are seen. No obstructive or inflammatory changes of large or small bowel are seen. The stomach is within normal limits. Vascular/Lymphatic: Aortic atherosclerosis. No enlarged abdominal or pelvic lymph nodes. Reproductive: Uterus is been surgically removed consistent with the given clinical history. Other: Significant ascites is noted new from the prior exam. This may be related to the underlying portal hypertension. Musculoskeletal: No acute or significant osseous findings. IMPRESSION: Changes of hepatic cirrhosis and significant ascites which is new from the prior exam. New moderate left-sided pleural effusion. Electronically Signed   By: Inez Catalina M.D.   On: 10/25/2018 21:34   Dg Chest 2 View  Result Date: 10/25/2018 CLINICAL DATA:  Shortness of breath EXAM: CHEST - 2 VIEW COMPARISON:  Chest CT April 22, 2018 FINDINGS: There is airspace consolidation in the left lower lobe with small left pleural effusion. The lungs elsewhere are clear. Heart size and pulmonary vascularity are normal. No adenopathy. No bone lesions. IMPRESSION: Left lower lobe airspace consolidation, felt to represent a degree of pneumonia. Small left pleural effusion. Lungs elsewhere clear. Cardiac  silhouette normal. No adenopathy. Followup PA and lateral chest radiographs recommended in 3-4 weeks following trial of antibiotic therapy to ensure resolution and exclude underlying malignancy. Electronically Signed   By: Lowella Grip III M.D.   On: 10/25/2018 17:31    EKG: Independently reviewed.  Sinus rhythm with right axis deviation.  No acute ST changes.  Assessment/Plan: Principal Problem:   Acute renal failure (ARF) (HCC) Active Problems:   Endometrial cancer ([place])   Diabetes mellitus without complication ([place])   Cirrhosis of liver with ascites ([place])   Essential hypertension   SOB (shortness of breath)   Acute lower UTI    This patient was discussed with the ED physician, including pertinent vitals, physical exam findings, labs, and imaging.  We also discussed care given by the ED provider.  1. Acute renal failure 1. Admit 2. IV fluids: We will bolus 1 L over  an hour and then started at 100 mL's per hour 3. Check creatinine in the morning 4. Likely secondary to diuretic, ARB, metformin use in the setting of diminished appetite and diminished fluid intake. 5. May need nephrology consult depending on whether symptoms are improving 6. Strict I's and O's 2. Acute lower UTI 1. Rocephin 2. Urine culture 3. Shortness of breath 1. Likely secondary to significant ascites. 2. Will discuss with interventional radiology tomorrow for drainage 4. Cirrhosis of the liver with ascites 1. Will discuss with interventional radiology tomorrow for paracentesis 5. Hypertension 1. Blood pressure currently a little low, but stable.  Will bolus with fluids and hold patient's antihypertensives for now 2. Hold diuretics 6. Diabetes 1. Sliding scale insulin 2. Lantus 10 units 3. Hold metformin 7. Endometrial cancer 1. Status post hysterectomy  DVT prophylaxis: Lovenox Consultants: Interventional radiology in the morning Code Status: Full code Family Communication: Daughter present  during interview and exam Disposition Plan: Pending   Truett Mainland, DO

## 2018-10-25 NOTE — ED Provider Notes (Signed)
The pt is a pleasant 73 y/o female - hx of 6 months ago having hysteretomy - since has had progressive swelling of legs - and now abdomen and SOB, on exam has anasarca - not urinating well with lasix at home -  Needs admission Lasix given Hospitalist consulted - reuqested CT Pt without hypoxia of any significance, has ARF since last labs in 2/20.  Medical screening examination/treatment/procedure(s) were conducted as a shared visit with non-physician practitioner(s) and myself.  I personally evaluated the patient during the encounter.  Clinical Impression:   Final diagnoses:  Acute renal failure, unspecified acute renal failure type ([place])  Anasarca         Noemi Chapel, MD 10/27/18 2029

## 2018-10-26 ENCOUNTER — Encounter (HOSPITAL_COMMUNITY): Payer: Self-pay | Admitting: Gastroenterology

## 2018-10-26 ENCOUNTER — Inpatient Hospital Stay (HOSPITAL_COMMUNITY): Payer: PPO

## 2018-10-26 DIAGNOSIS — N179 Acute kidney failure, unspecified: Secondary | ICD-10-CM

## 2018-10-26 DIAGNOSIS — R601 Generalized edema: Secondary | ICD-10-CM

## 2018-10-26 DIAGNOSIS — J90 Pleural effusion, not elsewhere classified: Secondary | ICD-10-CM

## 2018-10-26 DIAGNOSIS — I10 Essential (primary) hypertension: Secondary | ICD-10-CM

## 2018-10-26 DIAGNOSIS — C541 Malignant neoplasm of endometrium: Secondary | ICD-10-CM

## 2018-10-26 LAB — GLUCOSE, CAPILLARY
Glucose-Capillary: 77 mg/dL (ref 70–99)
Glucose-Capillary: 67 mg/dL — ABNORMAL LOW (ref 70–99)
Glucose-Capillary: 102 mg/dL — ABNORMAL HIGH (ref 70–99)
Glucose-Capillary: 62 mg/dL — ABNORMAL LOW (ref 70–99)
Glucose-Capillary: 71 mg/dL (ref 70–99)
Glucose-Capillary: 77 mg/dL (ref 70–99)

## 2018-10-26 LAB — BASIC METABOLIC PANEL
Anion gap: 9 (ref 5–15)
BUN: 71 mg/dL — ABNORMAL HIGH (ref 8–23)
CO2: 22 mmol/L (ref 22–32)
Calcium: 7.9 mg/dL — ABNORMAL LOW (ref 8.9–10.3)
Chloride: 104 mmol/L (ref 98–111)
Creatinine, Ser: 3.96 mg/dL — ABNORMAL HIGH (ref 0.44–1.00)
GFR calc Af Amer: 12 mL/min — ABNORMAL LOW (ref >60)
GFR calc non Af Amer: 11 mL/min — ABNORMAL LOW (ref >60)
Glucose, Bld: 86 mg/dL (ref 70–99)
Potassium: 4.4 mmol/L (ref 3.5–5.1)
Sodium: 135 mmol/L (ref 135–145)

## 2018-10-26 LAB — GRAM STAIN: Gram Stain: NONE SEEN

## 2018-10-26 LAB — HEMOGLOBIN A1C
Hgb A1c MFr Bld: 4.7 % — ABNORMAL LOW (ref 4.8–5.6)
Mean Plasma Glucose: 88.19 mg/dL

## 2018-10-26 LAB — MRSA PCR SCREENING: MRSA by PCR: NEGATIVE

## 2018-10-26 LAB — PROTEIN / CREATININE RATIO, URINE
Creatinine, Urine: 132.58 mg/dL
Protein Creatinine Ratio: 0.25 mg/mg{Cre} — ABNORMAL HIGH (ref 0.00–0.15)
Total Protein, Urine: 33 mg/dL

## 2018-10-26 LAB — BODY FLUID CELL COUNT WITH DIFFERENTIAL
Eos, Fluid: 0 %
Lymphs, Fluid: 84 %
Monocyte-Macrophage-Serous Fluid: 13 % — ABNORMAL LOW (ref 50–90)
Neutrophil Count, Fluid: 3 % (ref 0–25)
Other Cells, Fluid: 1 %
Total Nucleated Cell Count, Fluid: 228 cu mm (ref 0–1000)

## 2018-10-26 LAB — NA AND K (SODIUM & POTASSIUM), RAND UR
Potassium Urine: 17 mmol/L
Sodium, Ur: 23 mmol/L

## 2018-10-26 LAB — LACTATE DEHYDROGENASE: LDH: 140 U/L (ref 98–192)

## 2018-10-26 LAB — CREATININE, URINE, RANDOM: Creatinine, Urine: 133.27 mg/dL

## 2018-10-26 LAB — SARS CORONAVIRUS 2 (TAT 6-24 HRS): SARS Coronavirus 2: NEGATIVE

## 2018-10-26 LAB — ALBUMIN, PLEURAL OR PERITONEAL FLUID: Albumin, Fluid: <1 g/dL

## 2018-10-26 LAB — PROTIME-INR
INR: 2.1 — ABNORMAL HIGH (ref 0.8–1.2)
Prothrombin Time: 23.3 seconds — ABNORMAL HIGH (ref 11.4–15.2)

## 2018-10-26 MED ORDER — ALBUMIN HUMAN 25 % IV SOLN
50.0000 g | Freq: Once | INTRAVENOUS | Status: AC
  Administered 2018-10-26: 50 g via INTRAVENOUS
  Filled 2018-10-26: qty 200

## 2018-10-26 MED ORDER — SODIUM CHLORIDE 0.9 % IV BOLUS
250.0000 mL | Freq: Once | INTRAVENOUS | Status: AC
  Administered 2018-10-26: 250 mL via INTRAVENOUS

## 2018-10-26 MED ORDER — CHLORHEXIDINE GLUCONATE CLOTH 2 % EX PADS
6.0000 | MEDICATED_PAD | Freq: Every day | CUTANEOUS | Status: DC
  Administered 2018-10-28 – 2018-11-03 (×4): 6 via TOPICAL

## 2018-10-26 NOTE — Progress Notes (Signed)
PROGRESS NOTE  Jordan Le YNW:[id number] DOB: 08/27/1945 DOA: 10/25/2018 PCP: Manon Hilding, MD  Brief History:  73 year old female with a history of diabetes mellitus, hypertension, liver cirrhosis, endometrial carcinoma status post hysterectomy February 2020 presented with 45-month history of shortness of breath and dyspnea on exertion as well as decreased oral intake.  She has also complained of increasing peripheral edema and increasing abdominal girth she states that she usually sleeps in a recliner, but is unable to tell me whether she truly has orthopnea or not because she states that she has never tried to lay flat in her life.  She denied any fevers, chills, headache, chest pain, coughing, hemoptysis, nausea, vomiting, diarrhea, abdominal pain.  Notably, the patient states that she was started on furosemide 20 mg daily in early April 2020.  Her dose of furosemide was subsequently increased to 40 mg daily in mid May 2020.  Approximately 3 weeks prior to this admission, spironolactone was added.  She denies any outpatient NSAID use.  She continues on losartan.  The patient states that she was told about 1 month prior to this admission that she had a problem with her kidneys, but this was not investigated further.  In the ED, the patient was afebrile hemodynamically stable saturating 94-96% room air.  Serum creatinine was 3.94.  WBC 9.5, hemoglobin 11.3, platelets 213,000.  CT of the abdomen pelvis showed a large left pleural effusion with left basilar atelectasis.  There was a shrunken nodular liver consistent with liver cirrhosis.  There was no bowel wall thickening.  There was significant ascites.  Assessment/Plan: Decompensated liver cirrhosis -The patient has never had GI evaluation -GI consult -Hepatitis B surface antigen -Hepatitis C antibody -Antimitochondrial antibody -Anti-smooth muscle antibody -Alpha-1 antitrypsin level -Alpha-fetoprotein -ANA -Holding furosemide  and spironolactone secondary to AKI -10/26/18 paracentesis--3 L removed--> albumin given after paracentesis he has no he has no insurance "he is not to write that the problem still happens right -Continue ceftriaxone for SBP prophylaxis  Acute kidney injury -Likely due to hemodynamic changes in the setting of furosemide, spironolactone, losartan -Urine sodium and creatinine -Renal ultrasound negative for hydronephrosis -Nephrology consultation -urine protein/creatinine ratio  Left pleural effusion -request thoracocentesis -echo -suspect hepatothorax  Diabetes mellitus type 2 -Hemoglobin A1c -NovoLog sliding scale -Discontinue metformin -05/05/2018 hemoglobin A1c 6.0  Hypothyroidism -Continue Synthroid  Endometrial carcinoma -Status post hysterectomy 05/07/2018 -follow up Dr. Denman George  Hyperlipidemia -continue statin       Disposition Plan:   Home in 2-3 days  Family Communication:   NoFamily at bedside  Consultants:  GI, renal  Code Status:  FULL   DVT Prophylaxis:   [place] Lovenox   Procedures: As Listed in Progress Note Above  Antibiotics: Ceftriaxone 8/2>>>8/3     Subjective: Patient still complains of  A little short of breath but states that her breathing is much better than prior to the paracentesis.  She denies any headache, chest pain, nausea, vomiting, diarrhea, abdominal pain, dysuria, hematuria.  Objective: Vitals:   10/26/18 0600 10/26/18 0900 10/26/18 1021 10/26/18 1052  BP: (!) 93/38 (!) 93/43 (!) 101/40 (!) 97/42  Pulse: 85 81 88 84  Resp: 20 20 20 20   Temp:      TempSrc:      SpO2: 95% 96% 100% 100%  Weight:      Height:        Intake/Output Summary (Last 24 hours) at 10/26/2018 1244 Last data filed at  10/26/2018 0500 Gross per 24 hour  Intake 220.48 ml  Output 100 ml  Net 120.48 ml   Weight change:  Exam:   General:  Pt is alert, follows commands appropriately, not in acute distress  HEENT: No icterus, No thrush, No neck mass,  [place]/AT  Cardiovascular: RRR, S1/S2, no rubs, no gallops  Respiratory: bibasilar crackles; diminished breath sounds on left  Abdomen: Soft/+BS, non tender, non distended, no guarding  Extremities: 2 + LE edema, No lymphangitis, No petechiae, No rashes, no synovitis   Data Reviewed: I have personally reviewed following labs and imaging studies Basic Metabolic Panel: Recent Labs  Lab 10/25/18 1726 10/26/18 0543  NA 134* 135  K 3.9 4.4  CL 100 104  CO2 22 22  GLUCOSE 124* 86  BUN 70* 71*  CREATININE 3.94* 3.96*  CALCIUM 8.2* 7.9*  MG 2.4  --    Liver Function Tests: Recent Labs  Lab 10/25/18 1726  AST 29  ALT 14  ALKPHOS 53  BILITOT 3.1*  PROT 6.5  ALBUMIN 2.5*   No results for input(s): LIPASE, AMYLASE in the last 168 hours. No results for input(s): AMMONIA in the last 168 hours. Coagulation Profile: Recent Labs  Lab 10/26/18 0543  INR 2.1*   CBC: Recent Labs  Lab 10/25/18 1726  WBC 9.5  NEUTROABS 5.3  HGB 11.3*  HCT 34.2*  MCV 99.1  PLT 213   Cardiac Enzymes: No results for input(s): CKTOTAL, CKMB, CKMBINDEX, TROPONINI in the last 168 hours. BNP: Invalid input(s): POCBNP CBG: Recent Labs  Lab 10/25/18 2334 10/26/18 0836 10/26/18 1151 10/26/18 1237  GLUCAP 79 77 67* 102*   HbA1C: No results for input(s): HGBA1C in the last 72 hours. Urine analysis:    Component Value Date/Time   COLORURINE AMBER (A) 10/25/2018 2100   APPEARANCEUR CLOUDY (A) 10/25/2018 2100   LABSPEC 1.013 10/25/2018 2100   PHURINE 5.0 10/25/2018 2100   GLUCOSEU NEGATIVE 10/25/2018 2100   HGBUR SMALL (A) 10/25/2018 2100   [place] NEGATIVE 10/25/2018 2100   [place] NEGATIVE 10/25/2018 2100   PROTEINUR NEGATIVE 10/25/2018 2100   NITRITE NEGATIVE 10/25/2018 2100   LEUKOCYTESUR SMALL (A) 10/25/2018 2100   Sepsis Labs: @LABRCNTIP (procalcitonin:4,lacticidven:4) ) Recent Results (from the past 240 hour(s))  SARS CORONAVIRUS 2 Nasal Swab Aptima Multi Swab      Status: None   Collection Time: 10/25/18  5:23 PM   Specimen: Aptima Multi Swab; Nasal Swab  Result Value Ref Range Status   SARS Coronavirus 2 NEGATIVE NEGATIVE Final    Comment: (NOTE) SARS-CoV-2 target nucleic acids are NOT DETECTED. The SARS-CoV-2 RNA is generally detectable in upper and lower respiratory specimens during the acute phase of infection. Negative results do not preclude SARS-CoV-2 infection, do not rule out co-infections with other pathogens, and should not be used as the sole basis for treatment or other patient management decisions. Negative results must be combined with clinical observations, patient history, and epidemiological information. The expected result is Negative. Fact Sheet for Patients: SugarRoll.be Fact Sheet for Healthcare Providers: https://www.woods-mathews.com/ This test is not yet approved or cleared by the [place] FDA and  has been authorized for detection and/or diagnosis of SARS-CoV-2 by FDA under an Emergency Use Authorization (EUA). This EUA will remain  in effect (meaning this test can be used) for the duration of the COVID-19 declaration under Section 56 4(b)(1) of the Act, 21 U.S.C. section 360bbb-3(b)(1), unless the authorization is terminated or revoked sooner. Performed at [facility], [place]  [address], [place], [place] [address]   MRSA PCR Screening     Status: None   Collection Time: 10/25/18 11:17 PM   Specimen: Nasal Mucosa; Nasopharyngeal  Result Value Ref Range Status   MRSA by PCR NEGATIVE NEGATIVE Final    Comment:        The GeneXpert MRSA Assay (FDA approved for NASAL specimens only), is one component of a comprehensive MRSA colonization surveillance program. It is not intended to diagnose MRSA infection nor to guide or monitor treatment for MRSA infections. Performed at [facility], [address] [place], [place] [address]   Gram stain     Status: None    Collection Time: 10/26/18 10:26 AM   Specimen: Ascitic  Result Value Ref Range Status   Specimen Description ASCITIC  Final   Special Requests NONE  Final   Gram Stain   Final    NO ORGANISMS SEEN WBC PRESENT, PREDOMINANTLY MONONUCLEAR CYTOSPIN SMEAR Performed at [facility], [name], [place],  [address]    Report Status 10/26/2018 FINAL  Final     Scheduled Meds:  aspirin EC  81 mg Oral QHS   Chlorhexidine Gluconate Cloth  6 each Topical Q0600   enoxaparin (LOVENOX) injection  30 mg Subcutaneous Q24H   insulin aspart  0-15 Units Subcutaneous TID WC   insulin aspart  0-5 Units Subcutaneous QHS   insulin glargine  10 Units Subcutaneous QHS   levothyroxine  75 mcg Oral QAC breakfast   nystatin   Topical TID   simvastatin  20 mg Oral QHS   Continuous Infusions:  sodium chloride 100 mL/hr at 10/26/18 7017   cefTRIAXone (ROCEPHIN)  IV      Procedures/Studies: Ct Abdomen Pelvis Wo Contrast  Result Date: 10/25/2018 CLINICAL DATA:  Acute renal injury, history of prior hysterectomy EXAM: CT ABDOMEN AND PELVIS WITHOUT CONTRAST TECHNIQUE: Multidetector CT imaging of the abdomen and pelvis was performed following the standard protocol without IV contrast. COMPARISON:  04/22/2018 FINDINGS: Lower chest: Large left pleural effusion is noted with underlying left basilar atelectasis. The right lung base is clear. Hepatobiliary: Shrunken liver with nodular contour consistent with underlying cirrhosis. No focal mass is noted. The gallbladder is well distended with dependent gallstones similar to that noted on the prior exam. Pancreas: Unremarkable. No pancreatic ductal dilatation or surrounding inflammatory changes. Spleen: Normal in size without focal abnormality. Adrenals/Urinary Tract: Adrenal glands are within normal limits. Small exophytic cyst is noted arising from the right kidney similar to that seen on prior exam. No renal calculi or obstructive changes are seen. The  bladder is decompressed by Foley catheter. Stomach/Bowel: Appendix is not well visualized although no inflammatory changes are seen. No obstructive or inflammatory changes of large or small bowel are seen. The stomach is within normal limits. Vascular/Lymphatic: Aortic atherosclerosis. No enlarged abdominal or pelvic lymph nodes. Reproductive: Uterus is been surgically removed consistent with the given clinical history. Other: Significant ascites is noted new from the prior exam. This may be related to the underlying portal hypertension. Musculoskeletal: No acute or significant osseous findings. IMPRESSION: Changes of hepatic cirrhosis and significant ascites which is new from the prior exam. New moderate left-sided pleural effusion. Electronically Signed   By: Inez Catalina M.D.   On: 10/25/2018 21:34   Dg Chest 2 View  Result Date: 10/25/2018 CLINICAL DATA:  Shortness of breath EXAM: CHEST - 2 VIEW COMPARISON:  Chest CT April 22, 2018 FINDINGS: There is airspace consolidation in the left lower lobe with  small left pleural effusion. The lungs elsewhere are clear. Heart size and pulmonary vascularity are normal. No adenopathy. No bone lesions. IMPRESSION: Left lower lobe airspace consolidation, felt to represent a degree of pneumonia. Small left pleural effusion. Lungs elsewhere clear. Cardiac silhouette normal. No adenopathy. Followup PA and lateral chest radiographs recommended in 3-4 weeks following trial of antibiotic therapy to ensure resolution and exclude underlying malignancy. Electronically Signed   By: Lowella Grip III M.D.   On: 10/25/2018 17:31   US Renal  Result Date: 10/26/2018 CLINICAL DATA:  Acute kidney injury EXAM: RENAL / URINARY TRACT ULTRASOUND COMPLETE COMPARISON:  CT abdomen pelvis October 25, 2018 FINDINGS: Right Kidney: Renal measurements: 11.3 x 4.9 x 7.1 cm. = volume: 206 mL. Echogenicity is normal. There is a small anechoic cyst seen within the midpole measuring 0.9 x 1.0 x 0.9  cm. Left Kidney: Renal measurements: 11.8 x 5.2 x 5.8 cm = volume: 187 mL. Echogenicity within normal limits. No mass or hydronephrosis visualized. Bladder: Decompressed due to Foley catheter in place. Abdominopelvic ascites is seen. IMPRESSION: Small right midpole renal cyst.  Otherwise normal appearing kidneys. Electronically Signed   By: Prudencio Pair M.D.   On: 10/26/2018 11:49   US Paracentesis  Result Date: 10/26/2018 INDICATION: Cirrhosis, ascites EXAM: ULTRASOUND GUIDED DIAGNOSTIC AND THERAPEUTIC PARACENTESIS MEDICATIONS: None. COMPLICATIONS: None immediate. PROCEDURE: Procedure, benefits, and risks of procedure were discussed with patient. Written informed consent for procedure was obtained. Time out protocol followed. Adequate collection of ascites localized by ultrasound in RIGHT lower quadrant. Skin prepped and draped in usual sterile fashion. Skin and soft tissues anesthetized with 10 mL of 1% lidocaine. 5 [place] Yueh catheter placed into peritoneal cavity. 2 L of amber colored ascitic fluid aspirated by vacuum bottle suction. Volume removed limited to 2 L at request of ordering physician. Procedure tolerated well by patient without immediate complication. FINDINGS: A total of approximately 2 L of ascitic fluid was removed. Samples were sent to the laboratory as requested by the clinical team. IMPRESSION: Successful ultrasound-guided paracentesis yielding 2 liters of peritoneal fluid. Electronically Signed   By: Lavonia Dana M.D.   On: 10/26/2018 11:29    Orson Eva, DO  [facility] Pager [phone]  If 7PM-7AM, please contact night-coverage [email or web address] Password [email or web address] 10/26/2018, 12:44 PM   LOS: 1 day

## 2018-10-26 NOTE — Plan of Care (Signed)
Nutrition Education Note  RD consulted for Cirrhosis education. Provided "Cirrhosis Nutrition Therapy" handout from the Academy of [employer] and Dietetics to patient.   Encouraged the benefits of consuming 4-6 small meals/snacks throughout the day vs 3 larger meals to maximize intake and incorporating nutritional supplement drinks that are high in calories and protein.   Explained importance of limiting sodium intake and examples of food items that are high in sodium. Educated on how to decide if food items are considered high in sodium when looking at the nutrition facts labels.   Teach back method used.  Expect fair compliance.  Body mass index is 40.42 kg/m. Pt meets criteria for obesity based on current BMI.  Current diet order is NPO. Labs and medications reviewed. No further nutrition interventions warranted at this time. RD contact information provided. If additional nutrition issues arise, please re-consult RD.  Lajuan Lines, RD, LDN  After Hours/Weekend Pager: [phone]

## 2018-10-26 NOTE — Consult Note (Signed)
Referring Provider: Dr. Carles Collet  Primary Care Physician:  Manon Hilding, MD Primary Gastroenterologist:  Dr. Gala Romney   Date of Admission: 10/25/18 Date of Consultation: 10/26/18  Reason for Consultation: Newly diagnosed cirrhosis.   HPI:  Jordan Le is a 73 y.o. year old female with history of endometrial cancer diagnosed in Jan 2020, s/p hysterectomy, BSO, and lymphadenectomy in Feb 2020 by Dr. Everitt Amber. Presented to the ED yesterday due to worsening DOE, increased peripheral edema and abdominal distension. CT abd/pelvis without contrast with large left pleural effusion, cirrhosis, and significant ascites. Found to have acute kidney injury. Denies any prior knowledge of cirrhosis.   [place] para today s/p 2 liters removed. Negative cell count for SBP. Tbili 3.1 on admission. Normal transaminases. Albumin 2.5. Thoracentesis pending for today. Notes for the past few months she has noticed increased peripheral edema. She was prescribed Lasix by PCP several months ago without improvement. This was doubled at some point, and an additional diuretic added but she is unsure of the name. MAR shows Lasix 40 mg daily and aldactone 25 mg daily prior to admission. Denies any NSAIDs except 81 mg aspirin, despite ibuprofen mentioned on outpatient medication list. Notes lower extremity edema worsened to the point that she could not lift her legs to get in the car. Her daughters then encouraged her to present to the ED.   No known prior history of liver disease. No ETOH use. No first degree relatives with history of liver disease. Denies confusion, mental status changes, hematochezia, melena, abdominal pain, N/V,  weight loss, dysphagia, GERD. She has had decreased appetite but weight gain noted, which concerned her. Shortness of breath at rest currently. No fever or chills. No bowel habit changes. No prior colonoscopy or EGD. Additional labs for evaluation of cirrhosis have been ordered (alpha-1 antitrypsin, AMA, ASMA, Hep  C antibody, Hep B surface antigen. INR 2.1 today. Creatinine 3.96 this morning.   Past Medical History:  Diagnosis Date  . Arthritis   . Cancer [facility])    endometrial cancer  . Cirrhosis of liver ([place])   . Diabetes mellitus without complication ([place])    type 2  . High cholesterol   . Hypertension   . Mini stroke ([place])    2011  . Pneumonia   . Renal disorder     Past Surgical History:  Procedure Laterality Date  . ABDOMINAL HYSTERECTOMY    . BREAST BIOPSY Right    ~15 years ago-2005  . OTHER SURGICAL HISTORY  2009   Uterine Polyp removal  . polyps removed from the uterus    . ROBOTIC ASSISTED TOTAL HYSTERECTOMY WITH BILATERAL SALPINGO OOPHERECTOMY Bilateral 05/07/2018   Procedure: XI ROBOTIC ASSISTED TOTAL HYSTERECTOMY WITH BILATERAL SALPINGO OOPHORECTOMY PELVIC LYMPHANECTOMY;  Surgeon: Everitt Amber, MD;  Location: [name];  Service: Gynecology;  Laterality: Bilateral;  . SENTINEL NODE BIOPSY Bilateral 05/07/2018   Procedure: SENTINEL NODE BIOPSY;  Surgeon: Everitt Amber, MD;  Location: [name];  Service: Gynecology;  Laterality: Bilateral;    Prior to Admission medications   Medication Sig Start Date End Date Taking? Authorizing Provider  aspirin EC 81 MG tablet Take 81 mg by mouth at bedtime.    Yes [provider]  furosemide (LASIX) 40 MG tablet Take 40 mg by mouth daily.  10/05/18 10/30/19 Yes [provider]  ibuprofen (ADVIL,MOTRIN) 200 MG tablet Take 400 mg by mouth daily as needed for fever, headache or moderate pain.   Yes [provider]  levothyroxine (SYNTHROID)  75 MCG tablet Take 75 mcg by mouth daily before breakfast.  10/05/18 10/30/19 Yes [provider]  losartan (COZAAR) 25 MG tablet Take 25 mg by mouth daily.   Yes [provider]  metFORMIN (GLUCOPHAGE-XR) 500 MG 24 hr tablet Take 500 mg by mouth daily with breakfast.   Yes [provider]  simvastatin (ZOCOR) 20 MG tablet Take 20 mg by mouth at bedtime.    Yes  [provider]  spironolactone (ALDACTONE) 25 MG tablet Take 25 mg by mouth daily.  10/15/18 05/13/19 Yes [provider]    Current Facility-Administered Medications  Medication Dose Route Frequency Provider Last Rate Last Dose  . aspirin EC tablet 81 mg  81 mg Oral QHS Truett Mainland, DO   81 mg at 10/26/18 0044  . cefTRIAXone (ROCEPHIN) 1 g in sodium chloride 0.9 % 100 mL IVPB  1 g Intravenous Q24H Truett Mainland, DO      . Chlorhexidine Gluconate Cloth 2 % PADS 6 each  6 each Topical Q0600 Truett Mainland, DO      . enoxaparin (LOVENOX) injection 30 mg  30 mg Subcutaneous Q24H Truett Mainland, DO   30 mg at 10/26/18 5784  . insulin aspart (novoLOG) injection 0-15 Units  0-15 Units Subcutaneous TID WC Truett Mainland, DO      . insulin aspart (novoLOG) injection 0-5 Units  0-5 Units Subcutaneous QHS Stinson, Jacob J, DO      . levothyroxine (SYNTHROID) tablet 75 mcg  75 mcg Oral QAC breakfast Loma Boston J, DO      . nystatin (MYCOSTATIN/NYSTOP) topical powder   Topical TID Lovey Newcomer T, NP      . ondansetron (ZOFRAN) tablet 4 mg  4 mg Oral Q6H PRN Truett Mainland, DO       Or  . ondansetron [facility]) injection 4 mg  4 mg Intravenous Q6H PRN Truett Mainland, DO      . simvastatin (ZOCOR) tablet 20 mg  20 mg Oral QHS Truett Mainland, DO   20 mg at 10/26/18 0043    Allergies as of 10/25/2018  . (No Known Allergies)    Family History  Problem Relation Age of Onset  . Lung cancer Mother   . Hypertension Father   . Pancreatic cancer Brother   . Liver disease Neg Hx     Social History   Socioeconomic History  . Marital status: Married    Spouse name: Not on file  . Number of children: Not on file  . Years of education: Not on file  . Highest education level: Not on file  Occupational History  . Not on file  Social Needs  . Financial resource strain: Not on file  . Food insecurity    Worry: Not on file    Inability: Not on file  .  Transportation needs    Medical: Not on file    Non-medical: Not on file  Tobacco Use  . Smoking status: Never Smoker  . Smokeless tobacco: Never Used  Substance and Sexual Activity  . Alcohol use: Never    Frequency: Never  . Drug use: Never  . Sexual activity: Not Currently  Lifestyle  . Physical activity    Days per week: Not on file    Minutes per session: Not on file  . Stress: Not on file  Relationships  . Social Herbalist on phone: Not on file    Gets together:  Not on file    Attends religious service: Not on file    Active member of club or organization: Not on file    Attends meetings of clubs or organizations: Not on file    Relationship status: Not on file  . Intimate partner violence    Fear of current or ex partner: Not on file    Emotionally abused: Not on file    Physically abused: Not on file    Forced sexual activity: Not on file  Other Topics Concern  . Not on file  Social History Narrative  . Not on file    Review of Systems: Gen: see HPI CV: Denies chest pain, heart palpitations, syncope, edema  Resp: see HPI GI: see HPI.  GU : Denies urinary burning, urinary frequency, urinary incontinence.  MS: see HPI Derm: Denies rash, itching, dry skin Psych: Denies depression, anxiety,confusion, or memory loss Heme: Denies bruising, bleeding, and enlarged lymph nodes.  Physical Exam: Vital signs in last 24 hours: Temp:  [98.4 F (36.9 C)-98.9 F (37.2 C)] 98.9 F (37.2 C) (08/03 0400) Pulse Rate:  [81-133] 85 (08/03 1600) Resp:  [17-31] 24 (08/03 1600) BP: (85-130)/(36-59) 105/49 (08/03 1600) SpO2:  [94 %-100 %] 97 % (08/03 1600) Weight:  [97.5 kg-103.5 kg] 103.5 kg (08/03 0500) Last BM Date: 10/25/18 General:   Alert,  Well-developed, chronically-ill appearing, slight sallow appearance Head:  Normocephalic and atraumatic. Eyes:  Sclera clear, no icterus.  Ears:  Normal auditory acuity. Nose:  No deformity, discharge,  or  lesions. Lungs:  Without labored breathing, diminished bases,  Heart:  S1 S2 present, possibly soft systolic murmur  Abdomen:  Soft, obese, large pannus, +BS, no rebound or guarding, Rectal:  Deferred  Extremities:  With 3+ pitting lower extremity edema, 2+ pitting to thigh, marked pedal edema. Pre-tibial erythema to left leg Neurologic:  Alert and  oriented x4 Psych:  Alert and cooperative. Normal mood and affect.  Intake/Output from previous day: 08/02 0701 - 08/03 0700 In: 220.5 [I.V.:220.5] Out: 100 [Urine:100] Intake/Output this shift: Total I/O In: 1101.3 [I.V.:1051.1; IV Piggyback:50.2] Out: 150 [Urine:150]  Lab Results: Recent Labs    10/25/18 1726  WBC 9.5  HGB 11.3*  HCT 34.2*  PLT 213   BMET Recent Labs    10/25/18 1726 10/26/18 0543  NA 134* 135  K 3.9 4.4  CL 100 104  CO2 22 22  GLUCOSE 124* 86  BUN 70* 71*  CREATININE 3.94* 3.96*  CALCIUM 8.2* 7.9*   LFT Recent Labs    10/25/18 1726  PROT 6.5  ALBUMIN 2.5*  AST 29  ALT 14  ALKPHOS 53  BILITOT 3.1*   PT/INR Recent Labs    10/26/18 0543  LABPROT 23.3*  INR 2.1*    Studies/Results: Ct Abdomen Pelvis Wo Contrast  Result Date: 10/25/2018 CLINICAL DATA:  Acute renal injury, history of prior hysterectomy EXAM: CT ABDOMEN AND PELVIS WITHOUT CONTRAST TECHNIQUE: Multidetector CT imaging of the abdomen and pelvis was performed following the standard protocol without IV contrast. COMPARISON:  04/22/2018 FINDINGS: Lower chest: Large left pleural effusion is noted with underlying left basilar atelectasis. The right lung base is clear. Hepatobiliary: Shrunken liver with nodular contour consistent with underlying cirrhosis. No focal mass is noted. The gallbladder is well distended with dependent gallstones similar to that noted on the prior exam. Pancreas: Unremarkable. No pancreatic ductal dilatation or surrounding inflammatory changes. Spleen: Normal in size without focal abnormality. Adrenals/Urinary  Tract: Adrenal glands are within normal limits.  Small exophytic cyst is noted arising from the right kidney similar to that seen on prior exam. No renal calculi or obstructive changes are seen. The bladder is decompressed by Foley catheter. Stomach/Bowel: Appendix is not well visualized although no inflammatory changes are seen. No obstructive or inflammatory changes of large or small bowel are seen. The stomach is within normal limits. Vascular/Lymphatic: Aortic atherosclerosis. No enlarged abdominal or pelvic lymph nodes. Reproductive: Uterus is been surgically removed consistent with the given clinical history. Other: Significant ascites is noted new from the prior exam. This may be related to the underlying portal hypertension. Musculoskeletal: No acute or significant osseous findings. IMPRESSION: Changes of hepatic cirrhosis and significant ascites which is new from the prior exam. New moderate left-sided pleural effusion. Electronically Signed   By: Inez Catalina M.D.   On: 10/25/2018 21:34   Dg Chest 2 View  Result Date: 10/25/2018 CLINICAL DATA:  Shortness of breath EXAM: CHEST - 2 VIEW COMPARISON:  Chest CT April 22, 2018 FINDINGS: There is airspace consolidation in the left lower lobe with small left pleural effusion. The lungs elsewhere are clear. Heart size and pulmonary vascularity are normal. No adenopathy. No bone lesions. IMPRESSION: Left lower lobe airspace consolidation, felt to represent a degree of pneumonia. Small left pleural effusion. Lungs elsewhere clear. Cardiac silhouette normal. No adenopathy. Followup PA and lateral chest radiographs recommended in 3-4 weeks following trial of antibiotic therapy to ensure resolution and exclude underlying malignancy. Electronically Signed   By: Lowella Grip III M.D.   On: 10/25/2018 17:31   US Renal  Result Date: 10/26/2018 CLINICAL DATA:  Acute kidney injury EXAM: RENAL / URINARY TRACT ULTRASOUND COMPLETE COMPARISON:  CT abdomen pelvis  October 25, 2018 FINDINGS: Right Kidney: Renal measurements: 11.3 x 4.9 x 7.1 cm. = volume: 206 mL. Echogenicity is normal. There is a small anechoic cyst seen within the midpole measuring 0.9 x 1.0 x 0.9 cm. Left Kidney: Renal measurements: 11.8 x 5.2 x 5.8 cm = volume: 187 mL. Echogenicity within normal limits. No mass or hydronephrosis visualized. Bladder: Decompressed due to Foley catheter in place. Abdominopelvic ascites is seen. IMPRESSION: Small right midpole renal cyst.  Otherwise normal appearing kidneys. Electronically Signed   By: Prudencio Pair M.D.   On: 10/26/2018 11:49   US Paracentesis  Result Date: 10/26/2018 INDICATION: Cirrhosis, ascites EXAM: ULTRASOUND GUIDED DIAGNOSTIC AND THERAPEUTIC PARACENTESIS MEDICATIONS: None. COMPLICATIONS: None immediate. PROCEDURE: Procedure, benefits, and risks of procedure were discussed with patient. Written informed consent for procedure was obtained. Time out protocol followed. Adequate collection of ascites localized by ultrasound in RIGHT lower quadrant. Skin prepped and draped in usual sterile fashion. Skin and soft tissues anesthetized with 10 mL of 1% lidocaine. 5 [place] Yueh catheter placed into peritoneal cavity. 2 L of amber colored ascitic fluid aspirated by vacuum bottle suction. Volume removed limited to 2 L at request of ordering physician. Procedure tolerated well by patient without immediate complication. FINDINGS: A total of approximately 2 L of ascitic fluid was removed. Samples were sent to the laboratory as requested by the clinical team. IMPRESSION: Successful ultrasound-guided paracentesis yielding 2 liters of peritoneal fluid. Electronically Signed   By: Lavonia Dana M.D.   On: 10/26/2018 11:29    Impression: Pleasant 73 year old female presenting with newly diagnosed decompensated cirrhosis characterized by ascites/anasarca, pleural effusion, coagulopathy, and acute renal failure in setting of diuretic therapy. MELD Na 33. Likely etiology  for cirrhosis is fatty liver. Thus far, negative cell count for SBP. Awaiting  thoracentesis. She is without any concerning signs of encephalopathy and without acute GI bleeding.   Agree with additional serologies as ordered and will also check Hep A/B immunity while inpatient to determine vaccinations in an elective setting. No need for prophylactic antibiotics in this setting as no SBP. Recommend Nephrology consult for assistance with diuretic therapy going forward when renal function improves. May need IV Vit K if persistent/worsening coagulopathy. Will also add cytology to ascitic fluid. May need to consider doppler US of liver if persistent ascites despite supportive measures. Discussed with patient also the need for outpatient EGD for variceal screening and close follow-up with serial imaging as outpatient. Recommend 2 gram sodium diet after thoracentesis.   Plan: Repeat INR in am Add ANA, Hep A and B serologies, immunoglobulins Add cytology to ascitic fluid analysis Advance to 2 gram sodium diet after procedures Nephrology consultation Will reassess tomorrow  Annitta Needs, PhD, ANP-BC [facility]      LOS: 1 day    10/26/2018, 4:32 PM

## 2018-10-26 NOTE — Progress Notes (Signed)
Paracentesis complete no signs of distress.  

## 2018-10-26 NOTE — Progress Notes (Signed)
Patient transported to US Paracentesis. Report given to Remo Lipps, RN who temporarily will assume care of patient.  Celestia Khat, RN

## 2018-10-26 NOTE — Progress Notes (Signed)
Report given to 300 nurse, Saralyn Pilar, RN. Patient transferred. Patient and all VSS.

## 2018-10-26 NOTE — Progress Notes (Signed)
Patient returned from US Paracentesis. Patient placed back in bed and back on monitor.  Celestia Khat, RN

## 2018-10-26 NOTE — Procedures (Signed)
PreOperative Dx: Cirrhosis, ascites Postoperative Dx: Cirrhosis, ascites Procedure:   US guided paracentesis Radiologist:  Thornton Papas Anesthesia:  10 ml of1% lidocaine Specimen:  2 L of amber colored ascitic fluid EBL:   < 1 ml Complications: None

## 2018-10-27 ENCOUNTER — Inpatient Hospital Stay (HOSPITAL_COMMUNITY): Payer: PPO

## 2018-10-27 DIAGNOSIS — R601 Generalized edema: Secondary | ICD-10-CM

## 2018-10-27 DIAGNOSIS — R188 Other ascites: Secondary | ICD-10-CM

## 2018-10-27 DIAGNOSIS — I471 Supraventricular tachycardia: Secondary | ICD-10-CM

## 2018-10-27 DIAGNOSIS — K7460 Unspecified cirrhosis of liver: Secondary | ICD-10-CM

## 2018-10-27 DIAGNOSIS — R9431 Abnormal electrocardiogram [ECG] [EKG]: Secondary | ICD-10-CM

## 2018-10-27 DIAGNOSIS — N179 Acute kidney failure, unspecified: Secondary | ICD-10-CM

## 2018-10-27 LAB — HEPATITIS B SURFACE ANTIBODY, QUANTITATIVE: Hep B S AB Quant (Post): <3.1 m[IU]/mL — ABNORMAL LOW (ref >9.9)

## 2018-10-27 LAB — COMPREHENSIVE METABOLIC PANEL
ALT: 11 U/L (ref 0–44)
AST: 16 U/L (ref 15–41)
Albumin: 2.5 g/dL — ABNORMAL LOW (ref 3.5–5.0)
Alkaline Phosphatase: 29 U/L — ABNORMAL LOW (ref 38–126)
Anion gap: 9 (ref 5–15)
BUN: 69 mg/dL — ABNORMAL HIGH (ref 8–23)
CO2: 23 mmol/L (ref 22–32)
Calcium: 8.1 mg/dL — ABNORMAL LOW (ref 8.9–10.3)
Chloride: 106 mmol/L (ref 98–111)
Creatinine, Ser: 3.27 mg/dL — ABNORMAL HIGH (ref 0.44–1.00)
GFR calc Af Amer: 15 mL/min — ABNORMAL LOW (ref >60)
GFR calc non Af Amer: 13 mL/min — ABNORMAL LOW (ref >60)
Glucose, Bld: 84 mg/dL (ref 70–99)
Potassium: 4.6 mmol/L (ref 3.5–5.1)
Sodium: 138 mmol/L (ref 135–145)
Total Bilirubin: 2.5 mg/dL — ABNORMAL HIGH (ref 0.3–1.2)
Total Protein: 5.2 g/dL — ABNORMAL LOW (ref 6.5–8.1)

## 2018-10-27 LAB — GLUCOSE, CAPILLARY
Glucose-Capillary: 107 mg/dL — ABNORMAL HIGH (ref 70–99)
Glucose-Capillary: 120 mg/dL — ABNORMAL HIGH (ref 70–99)
Glucose-Capillary: 68 mg/dL — ABNORMAL LOW (ref 70–99)
Glucose-Capillary: 70 mg/dL (ref 70–99)
Glucose-Capillary: 74 mg/dL (ref 70–99)
Glucose-Capillary: 76 mg/dL (ref 70–99)
Glucose-Capillary: 82 mg/dL (ref 70–99)

## 2018-10-27 LAB — CBC
HCT: 29.6 % — ABNORMAL LOW (ref 36.0–46.0)
Hemoglobin: 9.7 g/dL — ABNORMAL LOW (ref 12.0–15.0)
MCH: 32.1 pg (ref 26.0–34.0)
MCHC: 32.8 g/dL (ref 30.0–36.0)
MCV: 98 fL (ref 80.0–100.0)
Platelets: 155 10*3/uL (ref 150–400)
RBC: 3.02 MIL/uL — ABNORMAL LOW (ref 3.87–5.11)
RDW: 15 % (ref 11.5–15.5)
WBC: 7.1 10*3/uL (ref 4.0–10.5)
nRBC: 0 % (ref 0.0–0.2)

## 2018-10-27 LAB — ECHOCARDIOGRAM COMPLETE
Height: 63 in
Weight: 3555.58 oz

## 2018-10-27 LAB — ANA: Anti Nuclear Antibody (ANA): NEGATIVE

## 2018-10-27 LAB — URINE CULTURE: Culture: NO GROWTH

## 2018-10-27 LAB — IRON AND TIBC
Iron: 53 ug/dL (ref 28–170)
Saturation Ratios: 54 % — ABNORMAL HIGH (ref 10.4–31.8)
TIBC: 99 ug/dL — ABNORMAL LOW (ref 250–450)
UIBC: 46 ug/dL

## 2018-10-27 LAB — IGG, IGA, IGM
IgA: 611 mg/dL — ABNORMAL HIGH (ref 64–422)
IgG (Immunoglobin G), Serum: 1751 mg/dL — ABNORMAL HIGH (ref 586–1602)
IgM (Immunoglobulin M), Srm: 58 mg/dL (ref 26–217)

## 2018-10-27 LAB — MITOCHONDRIAL ANTIBODIES: Mitochondrial M2 Ab, IgG: <20 Units (ref 0.0–20.0)

## 2018-10-27 LAB — PROTIME-INR
INR: 2.2 — ABNORMAL HIGH (ref 0.8–1.2)
Prothrombin Time: 23.9 seconds — ABNORMAL HIGH (ref 11.4–15.2)

## 2018-10-27 LAB — ANTI-SMOOTH MUSCLE ANTIBODY, IGG: F-Actin IgG: 8 Units (ref 0–19)

## 2018-10-27 LAB — FERRITIN: Ferritin: 584 ng/mL — ABNORMAL HIGH (ref 11–307)

## 2018-10-27 LAB — HEPATITIS B CORE ANTIBODY, TOTAL: Hep B Core Total Ab: NEGATIVE

## 2018-10-27 LAB — ALPHA-1-ANTITRYPSIN: A-1 Antitrypsin, Ser: 126 mg/dL (ref 101–187)

## 2018-10-27 LAB — HEPATITIS B SURFACE ANTIGEN: Hepatitis B Surface Ag: NEGATIVE

## 2018-10-27 LAB — HEPATITIS C ANTIBODY: HCV Ab: <0.1 s/co ratio (ref 0.0–0.9)

## 2018-10-27 LAB — PATHOLOGIST SMEAR REVIEW

## 2018-10-27 LAB — HEPATITIS A ANTIBODY, TOTAL: hep A Total Ab: NEGATIVE

## 2018-10-27 MED ORDER — ALBUMIN HUMAN 25 % IV SOLN
100.0000 g | Freq: Every day | INTRAVENOUS | Status: AC
  Administered 2018-10-27 – 2018-10-28 (×2): 100 g via INTRAVENOUS
  Filled 2018-10-27 (×3): qty 50
  Filled 2018-10-27 (×2): qty 400

## 2018-10-27 MED ORDER — ATORVASTATIN CALCIUM 10 MG PO TABS
10.0000 mg | ORAL_TABLET | Freq: Every day | ORAL | Status: DC
  Administered 2018-10-27 – 2018-11-04 (×9): 10 mg via ORAL
  Filled 2018-10-27 (×9): qty 1

## 2018-10-27 MED ORDER — DILTIAZEM HCL 30 MG PO TABS
30.0000 mg | ORAL_TABLET | Freq: Four times a day (QID) | ORAL | Status: DC
  Administered 2018-10-27 – 2018-11-05 (×30): 30 mg via ORAL
  Filled 2018-10-27 (×32): qty 1

## 2018-10-27 MED ORDER — VITAMIN K1 10 MG/ML IJ SOLN
10.0000 mg | Freq: Once | INTRAVENOUS | Status: AC
  Administered 2018-10-27: 10 mg via INTRAVENOUS
  Filled 2018-10-27: qty 1

## 2018-10-27 NOTE — Progress Notes (Signed)
Subjective: No confusion or mental status changes. Still with mild shortness of breath but improved from admission. Thoracentesis for today. No abdominal pain. No overt GI bleeding.   Objective: Vital signs in last 24 hours: Temp:  [98.3 F (36.8 C)] 98.3 F (36.8 C) (08/04 0543) Pulse Rate:  [81-126] 87 (08/04 0543) Resp:  [16-27] 16 (08/04 0543) BP: (90-116)/(40-59) 100/42 (08/04 0543) SpO2:  [94 %-100 %] 97 % (08/04 0543) Weight:  [100.8 kg] 100.8 kg (08/03 2045) Last BM Date: 10/25/18 General:   Alert and oriented, pleasant, sallow appearing Head:  Normocephalic and atraumatic. Abdomen:  Bowel sounds present, soft, non-tender, non-distended. Obese.  Extremities:  With 3+ pitting edema lower extremities and 2+ extending to thigh. Marked pedal edema. Erythema to bilateral anterior lower legs, with left great than right, which patient states is chronic Neurologic:  Alert and  oriented x4   Intake/Output from previous day: 08/03 0701 - 08/04 0700 In: 1101.3 [I.V.:1051.1; IV Piggyback:50.2] Out: 150 [Urine:150] Intake/Output this shift: Total I/O In: -  Out: 500 [Urine:500]  Lab Results: Recent Labs    10/25/18 1726 10/27/18 0613  WBC 9.5 7.1  HGB 11.3* 9.7*  HCT 34.2* 29.6*  PLT 213 155   BMET Recent Labs    10/25/18 1726 10/26/18 0543 10/27/18 0613  NA 134* 135 138  K 3.9 4.4 4.6  CL 100 104 106  CO2 22 22 23   GLUCOSE 124* 86 84  BUN 70* 71* 69*  CREATININE 3.94* 3.96* 3.27*  CALCIUM 8.2* 7.9* 8.1*   LFT Recent Labs    10/25/18 1726 10/27/18 0613  PROT 6.5 5.2*  ALBUMIN 2.5* 2.5*  AST 29 16  ALT 14 11  ALKPHOS 53 29*  BILITOT 3.1* 2.5*   PT/INR Recent Labs    10/26/18 0543 10/27/18 0613  LABPROT 23.3* 23.9*  INR 2.1* 2.2*   Hepatitis Panel Recent Labs    10/26/18 1730  HEPBSAG Negative  HCVAB <0.1     Studies/Results: Ct Abdomen Pelvis Wo Contrast  Result Date: 10/25/2018 CLINICAL DATA:  Acute renal injury, history of prior  hysterectomy EXAM: CT ABDOMEN AND PELVIS WITHOUT CONTRAST TECHNIQUE: Multidetector CT imaging of the abdomen and pelvis was performed following the standard protocol without IV contrast. COMPARISON:  04/22/2018 FINDINGS: Lower chest: Large left pleural effusion is noted with underlying left basilar atelectasis. The right lung base is clear. Hepatobiliary: Shrunken liver with nodular contour consistent with underlying cirrhosis. No focal mass is noted. The gallbladder is well distended with dependent gallstones similar to that noted on the prior exam. Pancreas: Unremarkable. No pancreatic ductal dilatation or surrounding inflammatory changes. Spleen: Normal in size without focal abnormality. Adrenals/Urinary Tract: Adrenal glands are within normal limits. Small exophytic cyst is noted arising from the right kidney similar to that seen on prior exam. No renal calculi or obstructive changes are seen. The bladder is decompressed by Foley catheter. Stomach/Bowel: Appendix is not well visualized although no inflammatory changes are seen. No obstructive or inflammatory changes of large or small bowel are seen. The stomach is within normal limits. Vascular/Lymphatic: Aortic atherosclerosis. No enlarged abdominal or pelvic lymph nodes. Reproductive: Uterus is been surgically removed consistent with the given clinical history. Other: Significant ascites is noted new from the prior exam. This may be related to the underlying portal hypertension. Musculoskeletal: No acute or significant osseous findings. IMPRESSION: Changes of hepatic cirrhosis and significant ascites which is new from the prior exam. New moderate left-sided pleural effusion. Electronically Signed  By: Inez Catalina M.D.   On: 10/25/2018 21:34   Dg Chest 2 View  Result Date: 10/25/2018 CLINICAL DATA:  Shortness of breath EXAM: CHEST - 2 VIEW COMPARISON:  Chest CT April 22, 2018 FINDINGS: There is airspace consolidation in the left lower lobe with small  left pleural effusion. The lungs elsewhere are clear. Heart size and pulmonary vascularity are normal. No adenopathy. No bone lesions. IMPRESSION: Left lower lobe airspace consolidation, felt to represent a degree of pneumonia. Small left pleural effusion. Lungs elsewhere clear. Cardiac silhouette normal. No adenopathy. Followup PA and lateral chest radiographs recommended in 3-4 weeks following trial of antibiotic therapy to ensure resolution and exclude underlying malignancy. Electronically Signed   By: Lowella Grip III M.D.   On: 10/25/2018 17:31   US Renal  Result Date: 10/26/2018 CLINICAL DATA:  Acute kidney injury EXAM: RENAL / URINARY TRACT ULTRASOUND COMPLETE COMPARISON:  CT abdomen pelvis October 25, 2018 FINDINGS: Right Kidney: Renal measurements: 11.3 x 4.9 x 7.1 cm. = volume: 206 mL. Echogenicity is normal. There is a small anechoic cyst seen within the midpole measuring 0.9 x 1.0 x 0.9 cm. Left Kidney: Renal measurements: 11.8 x 5.2 x 5.8 cm = volume: 187 mL. Echogenicity within normal limits. No mass or hydronephrosis visualized. Bladder: Decompressed due to Foley catheter in place. Abdominopelvic ascites is seen. IMPRESSION: Small right midpole renal cyst.  Otherwise normal appearing kidneys. Electronically Signed   By: Prudencio Pair M.D.   On: 10/26/2018 11:49   US Paracentesis  Result Date: 10/26/2018 INDICATION: Cirrhosis, ascites EXAM: ULTRASOUND GUIDED DIAGNOSTIC AND THERAPEUTIC PARACENTESIS MEDICATIONS: None. COMPLICATIONS: None immediate. PROCEDURE: Procedure, benefits, and risks of procedure were discussed with patient. Written informed consent for procedure was obtained. Time out protocol followed. Adequate collection of ascites localized by ultrasound in RIGHT lower quadrant. Skin prepped and draped in usual sterile fashion. Skin and soft tissues anesthetized with 10 mL of 1% lidocaine. 5 [place] Yueh catheter placed into peritoneal cavity. 2 L of amber colored ascitic fluid aspirated  by vacuum bottle suction. Volume removed limited to 2 L at request of ordering physician. Procedure tolerated well by patient without immediate complication. FINDINGS: A total of approximately 2 L of ascitic fluid was removed. Samples were sent to the laboratory as requested by the clinical team. IMPRESSION: Successful ultrasound-guided paracentesis yielding 2 liters of peritoneal fluid. Electronically Signed   By: Lavonia Dana M.D.   On: 10/26/2018 11:29    Assessment: Pleasant 73 year old female presenting with newly diagnosed decompensated cirrhosis characterized by ascites/anasarca, pleural effusion, coagulopathy, and acute renal failure in setting of diuretic therapy. MELD Na 33 on admission. Likely etiology for cirrhosis is fatty liver. Thus far, negative Hep B, C, and further serologies pending. Shortness of breath improved this morning but awaiting thoracentesis today.  Ascites/Anasarca in setting of decompensated cirrhosis: appreciate Nephrology consultation. Continue Albumin IV with holding diuretic therapy for now. Daily weights. Negative fluid analysis for SBP. If additional paras needed, limit to 2 liters.   Normocytic anemia: without overt GI bleeding. Multifactorial in this setting. Continue to follow. INR similar to yesterday, 2.2. Will give one dose of Vit K IV X 1 today and follow INR.   ?Cellulitis lower extremities: patient states has been present for awhile. Unchanged from yesterday. Management per attending.   Will need Hep A/B vaccinations as outpatient. Further cirrhosis care as outpatient to include serial imaging, EGD for variceal screening.   Plan: Appreciate Nephrology Daily weights 2 gram sodium diet when  thoracentesis completed Vit K 10 mg IV X 1 now, recheck INR tomorrow Follow pending cytology from fluid analysis. Added AFB smear as first ever para Follow additional pending serologies Follow H/H, monitor for overt GI bleeding Outpatient EGD for variceal screening   Outpatient follow-up for further cirrhosis care: will need Hep A/B vaccinations   Jordan Needs, PhD, ANP-BC [facility]     LOS: 2 days    10/27/2018, 8:11 AM

## 2018-10-27 NOTE — Consult Note (Addendum)
Reason for Consult: Acute kidney injury Referring Physician: Orson Eva, MD [facility])  HPI:  73 year old Caucasian woman with past medical history significant for type 2 diabetes mellitus, hypertension, dyslipidemia, history of TIA and recent history of endometrial cancer status post TAH/BSO/lymph node dissection in February of this year with verbal report of having a fatty liver.  About 5 months ago, she started developing increasing lower extremity edema with some abdominal distention and early satiety prompting initiation of diuretic therapy with cardiac work-up and thereafter CT scan of the abdomen and pelvis that showed ascites with cirrhosis of the liver.  She was initiated on diuretics that have been uptitrated without significant improvement of leg swelling that prompted her to present to the emergency room because of increasing lower extremity weakness/ambulation difficulties.  She underwent 2 L paracentesis yesterday-no evidence of SBP.  She was transiently admitted to the ICU for hypotension.  On admission, creatinine noted to be elevated at 3.9 with previous creatinine of 0.6 six months ago.  Prior to admission, she was taking furosemide and spironolactone along with losartan and reports decreased appetite/poor oral intake because of early satiety.  She denies any vomiting or diarrhea and denies the use of any nonsteroidal anti-inflammatory drugs (beyond aspirin 81 mg).  She denies any dysuria, urgency, frequency, flank pain, fever or chills and does not have any hematuria.  She denies any prior history of kidney disease including acute kidney injury, denies recurrent nephrolithiasis and denies recurrent UTIs.  She does not have any personal or familial history of autoimmune diseases or familial history of renal disease.  Overnight, creatinine has improved from 3.9 to 3.2 after holding diuretics and getting 50 g of albumin.  Urine sodium 23 (fractional excretion of sodium 0.5%), urine PC ratio 0.25,  0-5 RBC/hpf with specific gravity 1013.  Renal ultrasound negative for obstruction.  Charted urine output 500 cc this morning.  Viral hepatitis testing negative.  Ongoing work-up for cirrhosis by GI.  Past Medical History:  Diagnosis Date  . Arthritis   . Cancer [facility])    endometrial cancer  . Cirrhosis of liver ([place])   . Diabetes mellitus without complication ([place])    type 2  . High cholesterol   . Hypertension   . Mini stroke ([place])    2011  . Pneumonia   . Renal disorder     Past Surgical History:  Procedure Laterality Date  . ABDOMINAL HYSTERECTOMY    . BREAST BIOPSY Right    ~15 years ago-2005  . OTHER SURGICAL HISTORY  2009   Uterine Polyp removal  . polyps removed from the uterus    . ROBOTIC ASSISTED TOTAL HYSTERECTOMY WITH BILATERAL SALPINGO OOPHERECTOMY Bilateral 05/07/2018   Procedure: XI ROBOTIC ASSISTED TOTAL HYSTERECTOMY WITH BILATERAL SALPINGO OOPHORECTOMY PELVIC LYMPHANECTOMY;  Surgeon: Everitt Amber, MD;  Location: [name];  Service: Gynecology;  Laterality: Bilateral;  . SENTINEL NODE BIOPSY Bilateral 05/07/2018   Procedure: SENTINEL NODE BIOPSY;  Surgeon: Everitt Amber, MD;  Location: [name];  Service: Gynecology;  Laterality: Bilateral;    Family History  Problem Relation Age of Onset  . Lung cancer Mother   . Hypertension Father   . Pancreatic cancer Brother   . Liver disease Neg Hx     Social History:  reports that she has never smoked. She has never used smokeless tobacco. She reports that she does not drink alcohol or use drugs.  Allergies: No Known Allergies  Medications:  Scheduled: . aspirin EC  81 mg Oral QHS  .  Chlorhexidine Gluconate Cloth  6 each Topical Q0600  . enoxaparin (LOVENOX) injection  30 mg Subcutaneous Q24H  . insulin aspart  0-15 Units Subcutaneous TID WC  . insulin aspart  0-5 Units Subcutaneous QHS  . levothyroxine  75 mcg Oral QAC breakfast  . nystatin   Topical TID  . simvastatin  20 mg Oral QHS    BMP Latest Ref Rng &  Units 10/27/2018 10/26/2018 10/25/2018  Glucose 70 - 99 mg/dL 84 86 124(H)  BUN 8 - 23 mg/dL 69(H) 71(H) 70(H)  Creatinine 0.44 - 1.00 mg/dL 3.27(H) 3.96(H) 3.94(H)  Sodium 135 - 145 mmol/L 138 135 134(L)  Potassium 3.5 - 5.1 mmol/L 4.6 4.4 3.9  Chloride 98 - 111 mmol/L 106 104 100  CO2 22 - 32 mmol/L 23 22 22   Calcium 8.9 - 10.3 mg/dL 8.1(L) 7.9(L) 8.2(L)   CBC Latest Ref Rng & Units 10/27/2018 10/25/2018 05/05/2018  WBC 4.0 - 10.5 K/uL 7.1 9.5 5.5  Hemoglobin 12.0 - 15.0 g/dL 9.7(L) 11.3(L) 13.6  Hematocrit 36.0 - 46.0 % 29.6(L) 34.2(L) 41.5  Platelets 150 - 400 K/uL 155 213 169   Urinalysis    Component Value Date/Time   COLORURINE AMBER (A) 10/25/2018 2100   APPEARANCEUR CLOUDY (A) 10/25/2018 2100   LABSPEC 1.013 10/25/2018 2100   PHURINE 5.0 10/25/2018 2100   GLUCOSEU NEGATIVE 10/25/2018 2100   HGBUR SMALL (A) 10/25/2018 2100   [place] NEGATIVE 10/25/2018 2100   [place] NEGATIVE 10/25/2018 2100   PROTEINUR NEGATIVE 10/25/2018 2100   NITRITE NEGATIVE 10/25/2018 2100   LEUKOCYTESUR SMALL (A) 10/25/2018 2100     Ct Abdomen Pelvis Wo Contrast  Result Date: 10/25/2018 CLINICAL DATA:  Acute renal injury, history of prior hysterectomy EXAM: CT ABDOMEN AND PELVIS WITHOUT CONTRAST TECHNIQUE: Multidetector CT imaging of the abdomen and pelvis was performed following the standard protocol without IV contrast. COMPARISON:  04/22/2018 FINDINGS: Lower chest: Large left pleural effusion is noted with underlying left basilar atelectasis. The right lung base is clear. Hepatobiliary: Shrunken liver with nodular contour consistent with underlying cirrhosis. No focal mass is noted. The gallbladder is well distended with dependent gallstones similar to that noted on the prior exam. Pancreas: Unremarkable. No pancreatic ductal dilatation or surrounding inflammatory changes. Spleen: Normal in size without focal abnormality. Adrenals/Urinary Tract: Adrenal glands are within normal limits. Small exophytic  cyst is noted arising from the right kidney similar to that seen on prior exam. No renal calculi or obstructive changes are seen. The bladder is decompressed by Foley catheter. Stomach/Bowel: Appendix is not well visualized although no inflammatory changes are seen. No obstructive or inflammatory changes of large or small bowel are seen. The stomach is within normal limits. Vascular/Lymphatic: Aortic atherosclerosis. No enlarged abdominal or pelvic lymph nodes. Reproductive: Uterus is been surgically removed consistent with the given clinical history. Other: Significant ascites is noted new from the prior exam. This may be related to the underlying portal hypertension. Musculoskeletal: No acute or significant osseous findings. IMPRESSION: Changes of hepatic cirrhosis and significant ascites which is new from the prior exam. New moderate left-sided pleural effusion. Electronically Signed   By: Inez Catalina M.D.   On: 10/25/2018 21:34   Dg Chest 2 View  Result Date: 10/25/2018 CLINICAL DATA:  Shortness of breath EXAM: CHEST - 2 VIEW COMPARISON:  Chest CT April 22, 2018 FINDINGS: There is airspace consolidation in the left lower lobe with small left pleural effusion. The lungs elsewhere are clear. Heart size and pulmonary vascularity are normal. No  adenopathy. No bone lesions. IMPRESSION: Left lower lobe airspace consolidation, felt to represent a degree of pneumonia. Small left pleural effusion. Lungs elsewhere clear. Cardiac silhouette normal. No adenopathy. Followup PA and lateral chest radiographs recommended in 3-4 weeks following trial of antibiotic therapy to ensure resolution and exclude underlying malignancy. Electronically Signed   By: Lowella Grip III M.D.   On: 10/25/2018 17:31   US Renal  Result Date: 10/26/2018 CLINICAL DATA:  Acute kidney injury EXAM: RENAL / URINARY TRACT ULTRASOUND COMPLETE COMPARISON:  CT abdomen pelvis October 25, 2018 FINDINGS: Right Kidney: Renal measurements: 11.3 x 4.9  x 7.1 cm. = volume: 206 mL. Echogenicity is normal. There is a small anechoic cyst seen within the midpole measuring 0.9 x 1.0 x 0.9 cm. Left Kidney: Renal measurements: 11.8 x 5.2 x 5.8 cm = volume: 187 mL. Echogenicity within normal limits. No mass or hydronephrosis visualized. Bladder: Decompressed due to Foley catheter in place. Abdominopelvic ascites is seen. IMPRESSION: Small right midpole renal cyst.  Otherwise normal appearing kidneys. Electronically Signed   By: Prudencio Pair M.D.   On: 10/26/2018 11:49   US Paracentesis  Result Date: 10/26/2018 INDICATION: Cirrhosis, ascites EXAM: ULTRASOUND GUIDED DIAGNOSTIC AND THERAPEUTIC PARACENTESIS MEDICATIONS: None. COMPLICATIONS: None immediate. PROCEDURE: Procedure, benefits, and risks of procedure were discussed with patient. Written informed consent for procedure was obtained. Time out protocol followed. Adequate collection of ascites localized by ultrasound in RIGHT lower quadrant. Skin prepped and draped in usual sterile fashion. Skin and soft tissues anesthetized with 10 mL of 1% lidocaine. 5 [place] Yueh catheter placed into peritoneal cavity. 2 L of amber colored ascitic fluid aspirated by vacuum bottle suction. Volume removed limited to 2 L at request of ordering physician. Procedure tolerated well by patient without immediate complication. FINDINGS: A total of approximately 2 L of ascitic fluid was removed. Samples were sent to the laboratory as requested by the clinical team. IMPRESSION: Successful ultrasound-guided paracentesis yielding 2 liters of peritoneal fluid. Electronically Signed   By: Lavonia Dana M.D.   On: 10/26/2018 11:29    Review of Systems  Constitutional: Positive for malaise/fatigue. Negative for chills and fever.  HENT: Negative.   Eyes: Negative.   Respiratory: Positive for cough and shortness of breath. Negative for sputum production.        Shortness of breath with exertion  Cardiovascular: Positive for leg swelling.  Negative for chest pain, palpitations and orthopnea.  Gastrointestinal: Positive for nausea. Negative for blood in stool, diarrhea and vomiting.       Abdominal distention with early satiety  Genitourinary: Negative.   Musculoskeletal: Negative.   Skin: Negative.   Neurological: Positive for weakness. Negative for dizziness.       Lower extremity weakness   Blood pressure (!) 100/42, pulse 87, temperature 98.3 F (36.8 C), temperature source Oral, resp. rate 16, height 5\' 3"  (1.6 m), weight 100.8 kg, SpO2 97 %. Physical Exam  Nursing note and vitals reviewed. Constitutional: She is oriented to person, place, and time. She appears well-developed and well-nourished. No distress.  HENT:  Head: Normocephalic and atraumatic.  Mouth/Throat: Oropharynx is clear and moist. No oropharyngeal exudate.  Eyes: Pupils are equal, round, and reactive to light. Conjunctivae and EOM are normal. No scleral icterus.  Neck: Normal range of motion. Neck supple. No JVD present.  Cardiovascular: Normal rate and regular rhythm.  No murmur heard. Respiratory: She has no wheezes. She has no rales.  Decreased breath sounds over bases, poor inspiratory effort.  No  distinct rales or rhonchi  GI: Soft. Bowel sounds are normal. She exhibits distension. There is no abdominal tenderness. There is no rebound and no guarding.  Musculoskeletal:        General: Edema present.     Comments: 3+ pitting bilateral lower extremity edema  Neurological: She is alert and oriented to person, place, and time.  Skin: Skin is warm and dry. No rash noted.    Assessment/Plan: 1.  Acute kidney injury: Suspected to be hemodynamically mediated in the setting of recently increased diuretic doses with ongoing ARB use/decreased oral intake.  Unfortunately, she has significant interstitial volume with decreased intravascular volume as evident by low fractional excretion of sodium.  I agree with diuretic holiday for the next 24 to 48 hours  while continuing albumin infusions to try and safely volume expand her while improving oncotic pressure to promote fluid shifts.  Would thereafter restart diuretics when creatinine continues to trend down plausibly without restarting her ARB that would further compound renal perfusion.  Renal ultrasound does not show any obstruction and I will empirically check SPEP/free light chains.  No evidence from initial urinalysis that this is a glomerulonephritis and screening for this will be deferred for now.  Continue to avoid nonsteroidal anti-inflammatory drugs, iodinated intravenous contrast and hypotension.  Limit paracentesis to follow volume (<2L) at this time. 2.  New onset cirrhosis: Suspected to be from hepatic steatosis with history of type 2 diabetes mellitus/obesity.  Ongoing additional work-up by gastroenterology for other sources of cirrhosis, negative hepatitis C/B viral tests. 3.  Hyponatremia: Mild and secondary to cirrhosis/AKI.  Supportive management at this time with anticipated improvement with improving urine output. 4.  Anemia: Possibly secondary to chronic disease, will check iron studies.  Downtrending hemoglobin/hematocrit since admission likely reflective of initial volume contraction/hemoconcentration but will need to maintain vigilant monitoring for occult blood loss with hepatic cirrhosis and portal hypertension sequela.  Aramis Zobel K. 10/27/2018, 8:33 AM

## 2018-10-27 NOTE — Progress Notes (Signed)
Notified hospitalist of elevated heart rate, orders received.

## 2018-10-27 NOTE — Progress Notes (Signed)
PROGRESS NOTE  Jordan Le:[id number] DOB: [birth year] DOA: 10/25/2018 PCP: Manon Hilding, MD  Brief History:  73 year old female with a history of diabetes mellitus, hypertension, liver cirrhosis, endometrial carcinoma status post hysterectomy February 2020 presented with 44-month history of shortness of breath and dyspnea on exertion as well as decreased oral intake.  She has also complained of increasing peripheral edema and increasing abdominal girth she states that she usually sleeps in a recliner, but is unable to tell me whether she truly has orthopnea or not because she states that she has never tried to lay flat in her life.  She denied any fevers, chills, headache, chest pain, coughing, hemoptysis, nausea, vomiting, diarrhea, abdominal pain.  Notably, the patient states that she was started on furosemide 20 mg daily in early April 2020.  Her dose of furosemide was subsequently increased to 40 mg daily in mid May 2020.  Approximately 3 weeks prior to this admission, spironolactone was added.  She denies any outpatient NSAID use.  She continues on losartan.  The patient states that she was told about 1 month prior to this admission that she had a problem with her kidneys, but this was not investigated further.  In the ED, the patient was afebrile hemodynamically stable saturating 94-96% room air.  Serum creatinine was 3.94.  WBC 9.5, hemoglobin 11.3, platelets 213,000.  CT of the abdomen pelvis showed a large left pleural effusion with left basilar atelectasis.  There was a shrunken nodular liver consistent with liver cirrhosis.  There was no bowel wall thickening.  There was significant ascites.  Assessment/Plan: Decompensated liver cirrhosis -The patient has never had GI evaluation -GI consult appreciated -10/26/18--paracentesis--2L removed -Hepatitis B surface antigen--neg -Hepatitis C antibody--neg -Antimitochondrial antibody -Anti-smooth muscle antibody -Alpha-1  antitrypsin level--normal -Alpha-fetoprotein -ANA -Holding furosemide and spironolactone secondary to AKI -10/26/18 paracentesis--3 L removed--> albumin given after paracentesis he has no he has no insurance "he is not to write that the problem still happens right -Continue ceftriaxone for SBP prophylaxis  Acute kidney injury -Likely due to hemodynamic changes in the setting of furosemide, spironolactone, losartan -Urine sodium and creatinine <1 -Renal ultrasound negative for hydronephrosis -Nephrology consultation apprciated -urine protein/creatinine ratio--0.25  Left pleural effusion -requested thoracocentesis -discussed with Dr. Carmelina Dane overall small--defer thora for now -reassess for thora if respiratory status worsens again -echo -suspect hepatothorax  SVT/Atrial tachycardia -personally reviewed EKG--SVT, nonspecific T wave change -start po diltiazem -Echo -TSH  LLE Stasis Dermatitis -doubt cellulitis -present x 3 months -already on ceftriaxone without change  Diabetes mellitus type 2 -8/3/20Hemoglobin A1c--4.7 -NovoLog sliding scale -Discontinue metformin -05/05/2018 hemoglobin A1c 6.0  Hypothyroidism -Continue Synthroid  Endometrial carcinoma -Status post hysterectomy 05/07/2018 -follow up Dr. Denman George  Hyperlipidemia -continue statin   Disposition Plan:   Home in 2-3 days  Family Communication:   Granddaughter updated at bedside 8/4  Consultants:  GI, renal  Code Status:  FULL   DVT Prophylaxis:   [place] Lovenox      Procedures: As Listed in Progress Note Above  Antibiotics: Ceftriaxone 8/2>>>8/3   Subjective: Pt states overall breathing has improved, but still having a little dyspnea on exertion.  Denies cp, n/v/d, abd pain, hematochezia  Objective: Vitals:   10/26/18 2014 10/26/18 2045 10/27/18 0543 10/27/18 1123  BP:  (!) 116/50 (!) 100/42 (!) 106/48  Pulse:  (!) 124 87 (!) 128  Resp:  (!) 21 16 20   Temp:  98.3 F  (36.8 C)  98.3 F (36.8 C) 98.8 F (37.1 C)  TempSrc:  Oral Oral Oral  SpO2: 94% 95% 97% 99%  Weight:  100.8 kg    Height:  5\' 3"  (1.6 m)      Intake/Output Summary (Last 24 hours) at 10/27/2018 1239 Last data filed at 10/27/2018 0715 Gross per 24 hour  Intake 1101.25 ml  Output 650 ml  Net 451.25 ml   Weight change: 3.277 kg Exam:   General:  Pt is alert, follows commands appropriately, not in acute distress  HEENT: No icterus, No thrush, No neck mass, [place]/AT  Cardiovascular: RRR, S1/S2, no rubs, no gallops  Respiratory: bibasilar crackles  Abdomen: Soft/+BS, non tender, non distended, no guarding  Extremities: 2+ LE edema, No lymphangitis, No petechiae, No rashes, no synovitis   Data Reviewed: I have personally reviewed following labs and imaging studies Basic Metabolic Panel: Recent Labs  Lab 10/25/18 1726 10/26/18 0543 10/27/18 0613  NA 134* 135 138  K 3.9 4.4 4.6  CL 100 104 106  CO2 22 22 23   GLUCOSE 062* 86 84  BUN 70* 71* 69*  CREATININE 3.94* 3.96* 3.27*  CALCIUM 8.2* 7.9* 8.1*  MG 2.4  --   --    Liver Function Tests: Recent Labs  Lab 10/25/18 1726 10/27/18 0613  AST 29 16  ALT 14 11  ALKPHOS 53 29*  BILITOT 3.1* 2.5*  PROT 6.5 5.2*  ALBUMIN 2.5* 2.5*   No results for input(s): LIPASE, AMYLASE in the last 168 hours. No results for input(s): AMMONIA in the last 168 hours. Coagulation Profile: Recent Labs  Lab 10/26/18 0543 10/27/18 0613  INR 2.1* 2.2*   CBC: Recent Labs  Lab 10/25/18 1726 10/27/18 0613  WBC 9.5 7.1  NEUTROABS 5.3  --   HGB 11.3* 9.7*  HCT 34.2* 29.6*  MCV 99.1 98.0  PLT 213 155   Cardiac Enzymes: No results for input(s): CKTOTAL, CKMB, CKMBINDEX, TROPONINI in the last 168 hours. BNP: Invalid input(s): POCBNP CBG: Recent Labs  Lab 10/26/18 2047 10/27/18 0030 10/27/18 0115 10/27/18 0757 10/27/18 1126  GLUCAP 74 76 82 70 68*   HbA1C: Recent Labs    10/26/18 1402  HGBA1C 4.7*   Urine analysis:      Component Value Date/Time   COLORURINE AMBER (A) 10/25/2018 2100   APPEARANCEUR CLOUDY (A) 10/25/2018 2100   LABSPEC 1.013 10/25/2018 2100   PHURINE 5.0 10/25/2018 2100   GLUCOSEU NEGATIVE 10/25/2018 2100   HGBUR SMALL (A) 10/25/2018 2100   [place] NEGATIVE 10/25/2018 2100   [place] NEGATIVE 10/25/2018 2100   PROTEINUR NEGATIVE 10/25/2018 2100   NITRITE NEGATIVE 10/25/2018 2100   LEUKOCYTESUR SMALL (A) 10/25/2018 2100   Sepsis Labs: @LABRCNTIP (procalcitonin:4,lacticidven:4) ) Recent Results (from the past 240 hour(s))  SARS CORONAVIRUS 2 Nasal Swab Aptima Multi Swab     Status: None   Collection Time: 10/25/18  5:23 PM   Specimen: Aptima Multi Swab; Nasal Swab  Result Value Ref Range Status   SARS Coronavirus 2 NEGATIVE NEGATIVE Final    Comment: (NOTE) SARS-CoV-2 target nucleic acids are NOT DETECTED. The SARS-CoV-2 RNA is generally detectable in upper and lower respiratory specimens during the acute phase of infection. Negative results do not preclude SARS-CoV-2 infection, do not rule out co-infections with other pathogens, and should not be used as the sole basis for treatment or other patient management decisions. Negative results must be combined with clinical observations, patient history, and epidemiological information. The expected result is Negative. Fact Sheet for Patients: SugarRoll.be  Fact Sheet for Healthcare Providers: https://www.woods-mathews.com/ This test is not yet approved or cleared by the [place] FDA and  has been authorized for detection and/or diagnosis of SARS-CoV-2 by FDA under an Emergency Use Authorization (EUA). This EUA will remain  in effect (meaning this test can be used) for the duration of the COVID-19 declaration under Section 56 4(b)(1) of the Act, 21 U.S.C. section 360bbb-3(b)(1), unless the authorization is terminated or revoked sooner. Performed at [facility], [place]  [address] [place], [place] [address]   Urine culture     Status: None   Collection Time: 10/25/18  9:45 PM   Specimen: Urine, Catheterized  Result Value Ref Range Status   Specimen Description   Final    URINE, CATHETERIZED Performed at [facility], [address], [place], [place] [address]    Special Requests   Final    NONE Performed at [facility], [address], [place], [place] [address]    Culture   Final    NO GROWTH Performed at [facility], [place] [address] [place], [place] [address]    Report Status 10/27/2018 FINAL  Final  MRSA PCR Screening     Status: None   Collection Time: 10/25/18 11:17 PM   Specimen: Nasal Mucosa; Nasopharyngeal  Result Value Ref Range Status   MRSA by PCR NEGATIVE NEGATIVE Final    Comment:        The GeneXpert MRSA Assay (FDA approved for NASAL specimens only), is one component of a comprehensive MRSA colonization surveillance program. It is not intended to diagnose MRSA infection nor to guide or monitor treatment for MRSA infections. Performed at [facility], [address], [place], [place] [address]   Culture, body fluid-bottle     Status: None (Preliminary result)   Collection Time: 10/26/18 10:26 AM   Specimen: Ascitic  Result Value Ref Range Status   Specimen Description ASCITIC  Final   Special Requests BOTTLES DRAWN AEROBIC AND ANAEROBIC 10 CC EACH  Final   Culture   Final    NO GROWTH < 24 HOURS Performed at [facility], [address], [place], [place] [address]    Report Status PENDING  Incomplete  Gram stain     Status: None   Collection Time: 10/26/18 10:26 AM   Specimen: Ascitic  Result Value Ref Range Status   Specimen Description ASCITIC  Final   Special Requests NONE  Final   Gram Stain   Final    NO ORGANISMS SEEN WBC PRESENT, PREDOMINANTLY MONONUCLEAR CYTOSPIN SMEAR Performed at [facility], [address] [place],  [address]    Report Status 10/26/2018 FINAL  Final     Scheduled Meds:   aspirin EC  81 mg Oral QHS   Chlorhexidine Gluconate Cloth  6 each Topical Q0600   enoxaparin (LOVENOX) injection  30 mg Subcutaneous Q24H   insulin aspart  0-15 Units Subcutaneous TID WC   insulin aspart  0-5 Units Subcutaneous QHS   levothyroxine  75 mcg Oral QAC breakfast   nystatin   Topical TID   simvastatin  20 mg Oral QHS   Continuous Infusions:  albumin human 100 g (10/27/18 1042)   cefTRIAXone (ROCEPHIN)  IV 1 g (10/26/18 2226)    Procedures/Studies: Ct Abdomen Pelvis Wo Contrast  Result Date: 10/25/2018 CLINICAL DATA:  Acute renal injury, history of prior hysterectomy EXAM: CT ABDOMEN AND PELVIS WITHOUT CONTRAST TECHNIQUE: Multidetector CT imaging of the abdomen and pelvis was performed following  the standard protocol without IV contrast. COMPARISON:  04/22/2018 FINDINGS: Lower chest: Large left pleural effusion is noted with underlying left basilar atelectasis. The right lung base is clear. Hepatobiliary: Shrunken liver with nodular contour consistent with underlying cirrhosis. No focal mass is noted. The gallbladder is well distended with dependent gallstones similar to that noted on the prior exam. Pancreas: Unremarkable. No pancreatic ductal dilatation or surrounding inflammatory changes. Spleen: Normal in size without focal abnormality. Adrenals/Urinary Tract: Adrenal glands are within normal limits. Small exophytic cyst is noted arising from the right kidney similar to that seen on prior exam. No renal calculi or obstructive changes are seen. The bladder is decompressed by Foley catheter. Stomach/Bowel: Appendix is not well visualized although no inflammatory changes are seen. No obstructive or inflammatory changes of large or small bowel are seen. The stomach is within normal limits. Vascular/Lymphatic: Aortic atherosclerosis. No enlarged abdominal or pelvic lymph nodes. Reproductive: Uterus is been surgically removed consistent with the given clinical history. Other:  Significant ascites is noted new from the prior exam. This may be related to the underlying portal hypertension. Musculoskeletal: No acute or significant osseous findings. IMPRESSION: Changes of hepatic cirrhosis and significant ascites which is new from the prior exam. New moderate left-sided pleural effusion. Electronically Signed   By: Inez Catalina M.D.   On: 10/25/2018 21:34   Dg Chest 2 View  Result Date: 10/25/2018 CLINICAL DATA:  Shortness of breath EXAM: CHEST - 2 VIEW COMPARISON:  Chest CT April 22, 2018 FINDINGS: There is airspace consolidation in the left lower lobe with small left pleural effusion. The lungs elsewhere are clear. Heart size and pulmonary vascularity are normal. No adenopathy. No bone lesions. IMPRESSION: Left lower lobe airspace consolidation, felt to represent a degree of pneumonia. Small left pleural effusion. Lungs elsewhere clear. Cardiac silhouette normal. No adenopathy. Followup PA and lateral chest radiographs recommended in 3-4 weeks following trial of antibiotic therapy to ensure resolution and exclude underlying malignancy. Electronically Signed   By: Lowella Grip III M.D.   On: 10/25/2018 17:31   US Renal  Result Date: 10/26/2018 CLINICAL DATA:  Acute kidney injury EXAM: RENAL / URINARY TRACT ULTRASOUND COMPLETE COMPARISON:  CT abdomen pelvis October 25, 2018 FINDINGS: Right Kidney: Renal measurements: 11.3 x 4.9 x 7.1 cm. = volume: 206 mL. Echogenicity is normal. There is a small anechoic cyst seen within the midpole measuring 0.9 x 1.0 x 0.9 cm. Left Kidney: Renal measurements: 11.8 x 5.2 x 5.8 cm = volume: 187 mL. Echogenicity within normal limits. No mass or hydronephrosis visualized. Bladder: Decompressed due to Foley catheter in place. Abdominopelvic ascites is seen. IMPRESSION: Small right midpole renal cyst.  Otherwise normal appearing kidneys. Electronically Signed   By: Prudencio Pair M.D.   On: 10/26/2018 11:49   US Paracentesis  Result Date:  10/26/2018 INDICATION: Cirrhosis, ascites EXAM: ULTRASOUND GUIDED DIAGNOSTIC AND THERAPEUTIC PARACENTESIS MEDICATIONS: None. COMPLICATIONS: None immediate. PROCEDURE: Procedure, benefits, and risks of procedure were discussed with patient. Written informed consent for procedure was obtained. Time out protocol followed. Adequate collection of ascites localized by ultrasound in RIGHT lower quadrant. Skin prepped and draped in usual sterile fashion. Skin and soft tissues anesthetized with 10 mL of 1% lidocaine. 5 [place] Yueh catheter placed into peritoneal cavity. 2 L of amber colored ascitic fluid aspirated by vacuum bottle suction. Volume removed limited to 2 L at request of ordering physician. Procedure tolerated well by patient without immediate complication. FINDINGS: A total of approximately 2 L of ascitic  fluid was removed. Samples were sent to the laboratory as requested by the clinical team. IMPRESSION: Successful ultrasound-guided paracentesis yielding 2 liters of peritoneal fluid. Electronically Signed   By: Lavonia Dana M.D.   On: 10/26/2018 11:29    Orson Eva, DO  [facility] Pager [phone]  If 7PM-7AM, please contact night-coverage [email or web address] Password [email or web address] 10/27/2018, 12:39 PM   LOS: 2 days

## 2018-10-27 NOTE — Progress Notes (Signed)
*  PRELIMINARY RESULTS* Echocardiogram 2D Echocardiogram has been performed.  Jordan Le 10/27/2018, 4:18 PM

## 2018-10-28 DIAGNOSIS — R601 Generalized edema: Secondary | ICD-10-CM

## 2018-10-28 DIAGNOSIS — R188 Other ascites: Secondary | ICD-10-CM

## 2018-10-28 DIAGNOSIS — E119 Type 2 diabetes mellitus without complications: Secondary | ICD-10-CM

## 2018-10-28 DIAGNOSIS — K7460 Unspecified cirrhosis of liver: Secondary | ICD-10-CM

## 2018-10-28 LAB — KAPPA/LAMBDA LIGHT CHAINS
Kappa free light chain: 166.9 mg/L — ABNORMAL HIGH (ref 3.3–19.4)
Kappa, lambda light chain ratio: 1.39 (ref 0.26–1.65)
Lambda free light chains: 119.7 mg/L — ABNORMAL HIGH (ref 5.7–26.3)

## 2018-10-28 LAB — PROTEIN ELECTROPHORESIS, SERUM
A/G Ratio: 0.8 (ref 0.7–1.7)
Albumin ELP: 2.8 g/dL — ABNORMAL LOW (ref 2.9–4.4)
Alpha-1-Globulin: 0.2 g/dL (ref 0.0–0.4)
Alpha-2-Globulin: 0.4 g/dL (ref 0.4–1.0)
Beta Globulin: 0.8 g/dL (ref 0.7–1.3)
Gamma Globulin: 2.2 g/dL — ABNORMAL HIGH (ref 0.4–1.8)
Globulin, Total: 3.6 g/dL (ref 2.2–3.9)
Total Protein ELP: 6.4 g/dL (ref 6.0–8.5)

## 2018-10-28 LAB — RENAL FUNCTION PANEL
Albumin: 2.4 g/dL — ABNORMAL LOW (ref 3.5–5.0)
Anion gap: 9 (ref 5–15)
BUN: 66 mg/dL — ABNORMAL HIGH (ref 8–23)
CO2: 24 mmol/L (ref 22–32)
Calcium: 8.2 mg/dL — ABNORMAL LOW (ref 8.9–10.3)
Chloride: 106 mmol/L (ref 98–111)
Creatinine, Ser: 2.84 mg/dL — ABNORMAL HIGH (ref 0.44–1.00)
GFR calc Af Amer: 18 mL/min — ABNORMAL LOW (ref >60)
GFR calc non Af Amer: 16 mL/min — ABNORMAL LOW (ref >60)
Glucose, Bld: 95 mg/dL (ref 70–99)
Phosphorus: 3.9 mg/dL (ref 2.5–4.6)
Potassium: 4.4 mmol/L (ref 3.5–5.1)
Sodium: 139 mmol/L (ref 135–145)

## 2018-10-28 LAB — GLUCOSE, CAPILLARY
Glucose-Capillary: 96 mg/dL (ref 70–99)
Glucose-Capillary: 121 mg/dL — ABNORMAL HIGH (ref 70–99)
Glucose-Capillary: 111 mg/dL — ABNORMAL HIGH (ref 70–99)
Glucose-Capillary: 130 mg/dL — ABNORMAL HIGH (ref 70–99)

## 2018-10-28 LAB — HEPATIC FUNCTION PANEL
ALT: 9 U/L (ref 0–44)
AST: 21 U/L (ref 15–41)
Albumin: 2.4 g/dL — ABNORMAL LOW (ref 3.5–5.0)
Alkaline Phosphatase: 48 U/L (ref 38–126)
Bilirubin, Direct: 0.7 mg/dL — ABNORMAL HIGH (ref 0.0–0.2)
Indirect Bilirubin: 1.6 mg/dL — ABNORMAL HIGH (ref 0.3–0.9)
Total Bilirubin: 2.3 mg/dL — ABNORMAL HIGH (ref 0.3–1.2)
Total Protein: 5.1 g/dL — ABNORMAL LOW (ref 6.5–8.1)

## 2018-10-28 LAB — T4, FREE: Free T4: 0.94 ng/dL (ref 0.61–1.12)

## 2018-10-28 LAB — TSH: TSH: 5.458 u[IU]/mL — ABNORMAL HIGH (ref 0.350–4.500)

## 2018-10-28 NOTE — Progress Notes (Signed)
Subjective:  Feels like her legs are less heavy. No abd pain. Appetite poor but ate few bites this morning.   Objective: Vital signs in last 24 hours: Temp:  [98.3 F (36.8 C)-99.2 F (37.3 C)] 98.3 F (36.8 C) (08/05 0501) Pulse Rate:  [85-128] 85 (08/05 0501) Resp:  [19-20] 20 (08/05 0501) BP: (93-106)/(40-48) 102/44 (08/05 0501) SpO2:  [96 %-99 %] 97 % (08/05 0501) Last BM Date: 10/25/18 General:   Alert,  Well-developed, well-nourished, pleasant and cooperative in NAD Head:  Normocephalic and atraumatic. Eyes:  Sclera clear, no icterus.  Abdomen:  Soft with some distention. Nontender. Obese. Normal bowel sounds, without guarding, and without rebound.   Extremities:  Without clubbing, deformity. 2+ pitting edema to knees bilaterally, 3 + at ankles bilaterally. Erythema to bilateral anterior lower legs, left greater than right, no significant change.  Neurologic:  Alert and  oriented x4;  grossly normal neurologically. Psych:  Alert and cooperative. Normal mood and affect.  Intake/Output from previous day: 08/04 0701 - 08/05 0700 In: 668.9 [P.O.:480; IV Piggyback:188.9] Out: 1250 [Urine:1250] Intake/Output this shift: No intake/output data recorded.  Lab Results: CBC Recent Labs    10/25/18 1726 10/27/18 0613  WBC 9.5 7.1  HGB 11.3* 9.7*  HCT 34.2* 29.6*  MCV 99.1 98.0  PLT 213 155   BMET Recent Labs    10/26/18 0543 10/27/18 0613 10/28/18 0646  NA 135 138 139  K 4.4 4.6 4.4  CL 104 106 106  CO2 22 23 24   GLUCOSE 86 84 95  BUN 71* 69* 66*  CREATININE 3.96* 3.27* 2.84*  CALCIUM 7.9* 8.1* 8.2*   LFTs Recent Labs    10/25/18 1726 10/27/18 0613 10/28/18 0646  BILITOT 3.1* 2.5* 2.3*  BILIDIR  --   --  0.7*  IBILI  --   --  1.6*  ALKPHOS 53 29* 48  AST 29 16 21   ALT 14 11 9   PROT 6.5 5.2* 5.1*  ALBUMIN 2.5* 2.5* 2.4*  2.4*   No results for input(s): LIPASE in the last 72 hours. PT/INR Recent Labs    10/26/18 0543 10/27/18 0613  LABPROT 23.3*  23.9*  INR 2.1* 2.2*      Imaging Studies: Ct Abdomen Pelvis Wo Contrast  Result Date: 10/25/2018 CLINICAL DATA:  Acute renal injury, history of prior hysterectomy EXAM: CT ABDOMEN AND PELVIS WITHOUT CONTRAST TECHNIQUE: Multidetector CT imaging of the abdomen and pelvis was performed following the standard protocol without IV contrast. COMPARISON:  04/22/2018 FINDINGS: Lower chest: Large left pleural effusion is noted with underlying left basilar atelectasis. The right lung base is clear. Hepatobiliary: Shrunken liver with nodular contour consistent with underlying cirrhosis. No focal mass is noted. The gallbladder is well distended with dependent gallstones similar to that noted on the prior exam. Pancreas: Unremarkable. No pancreatic ductal dilatation or surrounding inflammatory changes. Spleen: Normal in size without focal abnormality. Adrenals/Urinary Tract: Adrenal glands are within normal limits. Small exophytic cyst is noted arising from the right kidney similar to that seen on prior exam. No renal calculi or obstructive changes are seen. The bladder is decompressed by Foley catheter. Stomach/Bowel: Appendix is not well visualized although no inflammatory changes are seen. No obstructive or inflammatory changes of large or small bowel are seen. The stomach is within normal limits. Vascular/Lymphatic: Aortic atherosclerosis. No enlarged abdominal or pelvic lymph nodes. Reproductive: Uterus is been surgically removed consistent with the given clinical history. Other: Significant ascites is noted new from the prior exam. This may  be related to the underlying portal hypertension. Musculoskeletal: No acute or significant osseous findings. IMPRESSION: Changes of hepatic cirrhosis and significant ascites which is new from the prior exam. New moderate left-sided pleural effusion. Electronically Signed   By: Inez Catalina M.D.   On: 10/25/2018 21:34   Dg Chest 2 View  Result Date: 10/25/2018 CLINICAL DATA:   Shortness of breath EXAM: CHEST - 2 VIEW COMPARISON:  Chest CT April 22, 2018 FINDINGS: There is airspace consolidation in the left lower lobe with small left pleural effusion. The lungs elsewhere are clear. Heart size and pulmonary vascularity are normal. No adenopathy. No bone lesions. IMPRESSION: Left lower lobe airspace consolidation, felt to represent a degree of pneumonia. Small left pleural effusion. Lungs elsewhere clear. Cardiac silhouette normal. No adenopathy. Followup PA and lateral chest radiographs recommended in 3-4 weeks following trial of antibiotic therapy to ensure resolution and exclude underlying malignancy. Electronically Signed   By: Lowella Grip III M.D.   On: 10/25/2018 17:31   US Renal  Result Date: 10/26/2018 CLINICAL DATA:  Acute kidney injury EXAM: RENAL / URINARY TRACT ULTRASOUND COMPLETE COMPARISON:  CT abdomen pelvis October 25, 2018 FINDINGS: Right Kidney: Renal measurements: 11.3 x 4.9 x 7.1 cm. = volume: 206 mL. Echogenicity is normal. There is a small anechoic cyst seen within the midpole measuring 0.9 x 1.0 x 0.9 cm. Left Kidney: Renal measurements: 11.8 x 5.2 x 5.8 cm = volume: 187 mL. Echogenicity within normal limits. No mass or hydronephrosis visualized. Bladder: Decompressed due to Foley catheter in place. Abdominopelvic ascites is seen. IMPRESSION: Small right midpole renal cyst.  Otherwise normal appearing kidneys. Electronically Signed   By: Prudencio Pair M.D.   On: 10/26/2018 11:49   US Paracentesis  Result Date: 10/26/2018 INDICATION: Cirrhosis, ascites EXAM: ULTRASOUND GUIDED DIAGNOSTIC AND THERAPEUTIC PARACENTESIS MEDICATIONS: None. COMPLICATIONS: None immediate. PROCEDURE: Procedure, benefits, and risks of procedure were discussed with patient. Written informed consent for procedure was obtained. Time out protocol followed. Adequate collection of ascites localized by ultrasound in RIGHT lower quadrant. Skin prepped and draped in usual sterile fashion. Skin  and soft tissues anesthetized with 10 mL of 1% lidocaine. 5 [place] Yueh catheter placed into peritoneal cavity. 2 L of amber colored ascitic fluid aspirated by vacuum bottle suction. Volume removed limited to 2 L at request of ordering physician. Procedure tolerated well by patient without immediate complication. FINDINGS: A total of approximately 2 L of ascitic fluid was removed. Samples were sent to the laboratory as requested by the clinical team. IMPRESSION: Successful ultrasound-guided paracentesis yielding 2 liters of peritoneal fluid. Electronically Signed   By: Lavonia Dana M.D.   On: 10/26/2018 11:29  [2 weeks]   Assessment: Pleasant 73 y/o female with newly diagnosed decompensated cirrhosis with ascites/anasarca, pleural effusion, coagulopathy, and ARF in setting of diuretic therapy. MELD Na 33 on admission. Creatinine improved. Work up this far with serologies negative for Hep B and Hep C. Autoimmune serologies, alpha 1 antitrypsin, unremarkable. Ferritin and iron sats moderately elevated and somewhat nonspecific. 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   Ascites/Anasarca in setting of decompensated cirrhosis. Negative fluid analysis for SBP. If additional paras needed, limit to 2 liters per nephrology recommendations. Appreciated nephrology assistance. Diuretic holiday for now per nephrology. Continue IV albumin. F/u pending fluid cultures.   Normocytic anemia: no overt GI bleeding. Multifactorial. Continue to follow.   Plan: 1. F/U pending fluid analysis.  2. Outpatient EGD for variceal screening.  3. Outpatient f/u for further cirrhosis care: will need  Hep A/B vaccinations.  4. Recheck INR, CMET, HFE genetics in AM.  5. 2 gram sodium restricted diet.   Laureen Ochs. Bernarda Caffey [facility] [phone] 8/5/20209:02 AM     LOS: 3 days

## 2018-10-28 NOTE — Care Management Important Message (Signed)
Important Message  Patient Details  Name: Jordan Le MRN: [id number] Date of Birth: 05/18/1945   Medicare Important Message Given:  Yes     [name] 10/28/2018, 2:40 PM

## 2018-10-28 NOTE — Progress Notes (Signed)
PROGRESS NOTE  Jordan Le VHQ:[id number] DOB: 1945-11-17 DOA: 10/25/2018 PCP: Manon Hilding, MD  Brief History:  73 year old female with a history of diabetes mellitus, hypertension, liver cirrhosis, endometrial carcinoma status post hysterectomy February 2020 presented with 20-month history of shortness of breath and dyspnea on exertion as well as decreased oral intake.  She has also complained of increasing peripheral edema and increasing abdominal girth she states that she usually sleeps in a recliner, but is unable to tell me whether she truly has orthopnea or not because she states that she has never tried to lay flat in her life.  She denied any fevers, chills, headache, chest pain, coughing, hemoptysis, nausea, vomiting, diarrhea, abdominal pain.  Notably, the patient states that she was started on furosemide 20 mg daily in early April 2020.  Her dose of furosemide was subsequently increased to 40 mg daily in mid May 2020.  Approximately 3 weeks prior to this admission, spironolactone was added.  She denies any outpatient NSAID use.  She continues on losartan.  The patient states that she was told about 1 month prior to this admission that she had a problem with her kidneys, but this was not investigated further.  In the ED, the patient was afebrile hemodynamically stable saturating 94-96% room air.  Serum creatinine was 3.94.  WBC 9.5, hemoglobin 11.3, platelets 213,000.  CT of the abdomen pelvis showed a large left pleural effusion with left basilar atelectasis.  There was a shrunken nodular liver consistent with liver cirrhosis.  There was no bowel wall thickening.  There was significant ascites.  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 -10/26/18--paracentesis--2L removed -Hepatitis B surface antigen--neg -Hepatitis C antibody--neg -Antimitochondrial antibody  negative -Anti-smooth muscle antibody negative -Alpha-1 antitrypsin level--normal -Alpha-fetoprotein pending -ANA negative -Holding furosemide and spironolactone secondary to AKI -10/26/18 paracentesis--3 L removed--> albumin given after paracentesis  -Continue ceftriaxone for SBP prophylaxis -Plan to restart Lasix 8/6  Acute kidney injury -Likely due to hemodynamic changes in the setting of furosemide, spironolactone, losartan -Urine sodium and creatinine <1 -Renal ultrasound negative for hydronephrosis -Nephrology consultation appreciated -urine protein/creatinine ratio--0.25  Left pleural effusion -requested thoracocentesis -discussed with Dr. Carmelina Dane overall small--defer thora for now -reassess for thora if respiratory status worsens again -echo shows normal EF -suspect hepatothorax  SVT/Atrial tachycardia -personally reviewed EKG--SVT, nonspecific T wave change -start po diltiazem -Echo shows normal EF -TSH 5.4  LLE Stasis Dermatitis -doubt cellulitis -present x 3 months -already on ceftriaxone without change -Should hopefully improve that she diuresis  Diabetes mellitus type 2 -8/3/20Hemoglobin A1c--4.7 -NovoLog sliding scale -Discontinue metformin  Hypothyroidism -Continue Synthroid  Endometrial carcinoma -Status post hysterectomy 05/07/2018 -follow up Dr. Denman George  Hyperlipidemia -continue statin   Disposition Plan:   Home in 2-3 days  Family Communication:   Granddaughter updated at bedside 8/5  Consultants:  GI, renal  Code Status:  FULL   DVT Prophylaxis:   [place] Lovenox      Procedures: As Listed in Progress Note Above  Antibiotics: Ceftriaxone 8/2>>>8/3   Subjective: She is not had any bowel movements.  Denies any shortness of breath.  Feels very weak.  Objective: Vitals:   10/27/18 1123 10/27/18 2122 10/28/18 0501 10/28/18 1414  BP: (!) 106/48 (!) 93/40 (!) 102/44 (!) 103/43  Pulse: (!) 128 (!) 117 85 (!) 113   Resp: 20 19 20 18   Temp: 98.8 F (37.1 C)  99.2 F (37.3 C) 98.3 F (36.8 C) 98.6 F (37 C)  TempSrc: Oral Oral Oral Oral  SpO2: 99% 96% 97% 94%  Weight:      Height:        Intake/Output Summary (Last 24 hours) at 10/28/2018 2029 Last data filed at 10/28/2018 1849 Gross per 24 hour  Intake 720 ml  Output 1250 ml  Net -530 ml   Weight change:  Exam:  General exam: Alert, awake, oriented x 3 Respiratory system: Crackles at bases. Respiratory effort normal. Cardiovascular system:RRR. No murmurs, rubs, gallops. Gastrointestinal system: Abdomen is distended, soft and nontender. No organomegaly or masses felt. Normal bowel sounds heard. Central nervous system: Alert and oriented. No focal neurological deficits. Extremities: 2+ edema lower extremities bilaterally Skin: Venous stasis changes in lower extremities bilaterally  Psychiatry: Judgement and insight appear normal. Mood & affect appropriate.   Data Reviewed: I have personally reviewed following labs and imaging studies Basic Metabolic Panel: Recent Labs  Lab 10/25/18 1726 10/26/18 0543 10/27/18 0613 10/28/18 0646  NA 134* 135 138 139  K 3.9 4.4 4.6 4.4  CL 100 104 106 106  CO2 22 22 23 24   GLUCOSE 124* 86 84 95  BUN 70* 71* 69* 66*  CREATININE 3.94* 3.96* 3.27* 2.84*  CALCIUM 8.2* 7.9* 8.1* 8.2*  MG 2.4  --   --   --   PHOS  --   --   --  3.9   Liver Function Tests: Recent Labs  Lab 10/25/18 1726 10/27/18 0613 10/28/18 0646  AST 29 16 21   ALT 14 11 9   ALKPHOS 53 29* 48  BILITOT 3.1* 2.5* 2.3*  PROT 6.5 5.2* 5.1*  ALBUMIN 2.5* 2.5* 2.4*   2.4*   No results for input(s): LIPASE, AMYLASE in the last 168 hours. No results for input(s): AMMONIA in the last 168 hours. Coagulation Profile: Recent Labs  Lab 10/26/18 0543 10/27/18 0613  INR 2.1* 2.2*   CBC: Recent Labs  Lab 10/25/18 1726 10/27/18 0613  WBC 9.5 7.1  NEUTROABS 5.3  --   HGB 11.3* 9.7*  HCT 34.2* 29.6*  MCV 99.1 98.0  PLT 213 155    Cardiac Enzymes: No results for input(s): CKTOTAL, CKMB, CKMBINDEX, TROPONINI in the last 168 hours. BNP: Invalid input(s): POCBNP CBG: Recent Labs  Lab 10/27/18 1645 10/27/18 2124 10/28/18 0718 10/28/18 1115 10/28/18 1628  GLUCAP 107* 120* 96 121* 111*   HbA1C: Recent Labs    10/26/18 1402  HGBA1C 4.7*   Urine analysis:    Component Value Date/Time   COLORURINE AMBER (A) 10/25/2018 2100   APPEARANCEUR CLOUDY (A) 10/25/2018 2100   LABSPEC 1.013 10/25/2018 2100   PHURINE 5.0 10/25/2018 2100   GLUCOSEU NEGATIVE 10/25/2018 2100   HGBUR SMALL (A) 10/25/2018 2100   [place] NEGATIVE 10/25/2018 2100   [place] NEGATIVE 10/25/2018 2100   PROTEINUR NEGATIVE 10/25/2018 2100   NITRITE NEGATIVE 10/25/2018 2100   LEUKOCYTESUR SMALL (A) 10/25/2018 2100   Sepsis Labs: @LABRCNTIP (procalcitonin:4,lacticidven:4) ) Recent Results (from the past 240 hour(s))  SARS CORONAVIRUS 2 Nasal Swab Aptima Multi Swab     Status: None   Collection Time: 10/25/18  5:23 PM   Specimen: Aptima Multi Swab; Nasal Swab  Result Value Ref Range Status   SARS Coronavirus 2 NEGATIVE NEGATIVE Final    Comment: (NOTE) SARS-CoV-2 target nucleic acids are NOT DETECTED. The SARS-CoV-2 RNA is generally detectable in upper and lower respiratory specimens during the acute phase of infection. Negative results do not  preclude SARS-CoV-2 infection, do not rule out co-infections with other pathogens, and should not be used as the sole basis for treatment or other patient management decisions. Negative results must be combined with clinical observations, patient history, and epidemiological information. The expected result is Negative. Fact Sheet for Patients: SugarRoll.be Fact Sheet for Healthcare Providers: https://www.woods-mathews.com/ This test is not yet approved or cleared by the [place] FDA and  has been authorized for detection and/or diagnosis of  SARS-CoV-2 by FDA under an Emergency Use Authorization (EUA). This EUA will remain  in effect (meaning this test can be used) for the duration of the COVID-19 declaration under Section 56 4(b)(1) of the Act, 21 U.S.C. section 360bbb-3(b)(1), unless the authorization is terminated or revoked sooner. Performed at [facility], [place] [address] [place], [place] [address]   Urine culture     Status: None   Collection Time: 10/25/18  9:45 PM   Specimen: Urine, Catheterized  Result Value Ref Range Status   Specimen Description   Final    URINE, CATHETERIZED Performed at [facility], [address], [place], [place] [address]    Special Requests   Final    NONE Performed at [facility], [address], [place], [place] [address]    Culture   Final    NO GROWTH Performed at [facility], [place] [address], [place], [place] [address]    Report Status 10/27/2018 FINAL  Final  MRSA PCR Screening     Status: None   Collection Time: 10/25/18 11:17 PM   Specimen: Nasal Mucosa; Nasopharyngeal  Result Value Ref Range Status   MRSA by PCR NEGATIVE NEGATIVE Final    Comment:        The GeneXpert MRSA Assay (FDA approved for NASAL specimens only), is one component of a comprehensive MRSA colonization surveillance program. It is not intended to diagnose MRSA infection nor to guide or monitor treatment for MRSA infections. Performed at [facility], [address], [place], [place] [address]   Culture, body fluid-bottle     Status: None (Preliminary result)   Collection Time: 10/26/18 10:26 AM   Specimen: Ascitic  Result Value Ref Range Status   Specimen Description ASCITIC  Final   Special Requests BOTTLES DRAWN AEROBIC AND ANAEROBIC 10 CC EACH  Final   Culture   Final    NO GROWTH 2 DAYS Performed at [facility], [address] [place], [place] [address]    Report Status PENDING  Incomplete  Gram stain     Status: None   Collection Time: 10/26/18 10:26 AM   Specimen:  Ascitic  Result Value Ref Range Status   Specimen Description ASCITIC  Final   Special Requests NONE  Final   Gram Stain   Final    NO ORGANISMS SEEN WBC PRESENT, PREDOMINANTLY MONONUCLEAR CYTOSPIN SMEAR Performed at [facility], [address] [place], [place] [address]    Report Status 10/26/2018 FINAL  Final     Scheduled Meds:  aspirin EC  81 mg Oral QHS   atorvastatin  10 mg Oral q1800   Chlorhexidine Gluconate Cloth  6 each Topical Q0600   diltiazem  30 mg Oral Q6H   enoxaparin (LOVENOX) injection  30 mg Subcutaneous Q24H   insulin aspart  0-15 Units Subcutaneous TID WC   insulin aspart  0-5 Units Subcutaneous QHS   levothyroxine  75 mcg Oral QAC breakfast   nystatin   Topical TID   Continuous Infusions:  cefTRIAXone (  ROCEPHIN)  IV Stopped (10/27/18 2210)    Procedures/Studies: Ct Abdomen Pelvis Wo Contrast  Result Date: 10/25/2018 CLINICAL DATA:  Acute renal injury, history of prior hysterectomy EXAM: CT ABDOMEN AND PELVIS WITHOUT CONTRAST TECHNIQUE: Multidetector CT imaging of the abdomen and pelvis was performed following the standard protocol without IV contrast. COMPARISON:  04/22/2018 FINDINGS: Lower chest: Large left pleural effusion is noted with underlying left basilar atelectasis. The right lung base is clear. Hepatobiliary: Shrunken liver with nodular contour consistent with underlying cirrhosis. No focal mass is noted. The gallbladder is well distended with dependent gallstones similar to that noted on the prior exam. Pancreas: Unremarkable. No pancreatic ductal dilatation or surrounding inflammatory changes. Spleen: Normal in size without focal abnormality. Adrenals/Urinary Tract: Adrenal glands are within normal limits. Small exophytic cyst is noted arising from the right kidney similar to that seen on prior exam. No renal calculi or obstructive changes are seen. The bladder is decompressed by Foley catheter. Stomach/Bowel: Appendix is not well visualized  although no inflammatory changes are seen. No obstructive or inflammatory changes of large or small bowel are seen. The stomach is within normal limits. Vascular/Lymphatic: Aortic atherosclerosis. No enlarged abdominal or pelvic lymph nodes. Reproductive: Uterus is been surgically removed consistent with the given clinical history. Other: Significant ascites is noted new from the prior exam. This may be related to the underlying portal hypertension. Musculoskeletal: No acute or significant osseous findings. IMPRESSION: Changes of hepatic cirrhosis and significant ascites which is new from the prior exam. New moderate left-sided pleural effusion. Electronically Signed   By: Inez Catalina M.D.   On: 10/25/2018 21:34   Dg Chest 2 View  Result Date: 10/25/2018 CLINICAL DATA:  Shortness of breath EXAM: CHEST - 2 VIEW COMPARISON:  Chest CT April 22, 2018 FINDINGS: There is airspace consolidation in the left lower lobe with small left pleural effusion. The lungs elsewhere are clear. Heart size and pulmonary vascularity are normal. No adenopathy. No bone lesions. IMPRESSION: Left lower lobe airspace consolidation, felt to represent a degree of pneumonia. Small left pleural effusion. Lungs elsewhere clear. Cardiac silhouette normal. No adenopathy. Followup PA and lateral chest radiographs recommended in 3-4 weeks following trial of antibiotic therapy to ensure resolution and exclude underlying malignancy. Electronically Signed   By: Lowella Grip III M.D.   On: 10/25/2018 17:31   US Renal  Result Date: 10/26/2018 CLINICAL DATA:  Acute kidney injury EXAM: RENAL / URINARY TRACT ULTRASOUND COMPLETE COMPARISON:  CT abdomen pelvis October 25, 2018 FINDINGS: Right Kidney: Renal measurements: 11.3 x 4.9 x 7.1 cm. = volume: 206 mL. Echogenicity is normal. There is a small anechoic cyst seen within the midpole measuring 0.9 x 1.0 x 0.9 cm. Left Kidney: Renal measurements: 11.8 x 5.2 x 5.8 cm = volume: 187 mL. Echogenicity  within normal limits. No mass or hydronephrosis visualized. Bladder: Decompressed due to Foley catheter in place. Abdominopelvic ascites is seen. IMPRESSION: Small right midpole renal cyst.  Otherwise normal appearing kidneys. Electronically Signed   By: Prudencio Pair M.D.   On: 10/26/2018 11:49   US Paracentesis  Result Date: 10/26/2018 INDICATION: Cirrhosis, ascites EXAM: ULTRASOUND GUIDED DIAGNOSTIC AND THERAPEUTIC PARACENTESIS MEDICATIONS: None. COMPLICATIONS: None immediate. PROCEDURE: Procedure, benefits, and risks of procedure were discussed with patient. Written informed consent for procedure was obtained. Time out protocol followed. Adequate collection of ascites localized by ultrasound in RIGHT lower quadrant. Skin prepped and draped in usual sterile fashion. Skin and soft tissues anesthetized with 10 mL of 1% lidocaine.  5 [place] Yueh catheter placed into peritoneal cavity. 2 L of amber colored ascitic fluid aspirated by vacuum bottle suction. Volume removed limited to 2 L at request of ordering physician. Procedure tolerated well by patient without immediate complication. FINDINGS: A total of approximately 2 L of ascitic fluid was removed. Samples were sent to the laboratory as requested by the clinical team. IMPRESSION: Successful ultrasound-guided paracentesis yielding 2 liters of peritoneal fluid. Electronically Signed   By: Lavonia Dana M.D.   On: 10/26/2018 11:29    Kathie Dike, MD  [facility]   If 7PM-7AM, please contact night-coverage [email or web address]  10/28/2018, 8:29 PM   LOS: 3 days

## 2018-10-28 NOTE — Progress Notes (Addendum)
Foley removed at 1615.  Purwick just placed.  Sat up in chair for several hours today.  Family has been at bedside. Paracentesis site to right lower abd draining serous fluid.  4 by 4's and abd placed

## 2018-10-28 NOTE — Progress Notes (Signed)
Patient ID: Jordan Le, female   DOB: 1945/12/03, 73 y.o.   MRN: [id number] S: Feeling better today O:BP (!) 102/44   Pulse 85   Temp 98.3 F (36.8 C) (Oral)   Resp 20   Ht 5\' 3"  (1.6 m)   Wt 100.8 kg   SpO2 97%   BMI 39.37 kg/m   Intake/Output Summary (Last 24 hours) at 10/28/2018 0865 Last data filed at 10/27/2018 2127 Gross per 24 hour  Intake 668.89 ml  Output 750 ml  Net -81.11 ml   Intake/Output: I/O last 3 completed shifts: In: 668.9 [P.O.:480; IV Piggyback:188.9] Out: 1250 [Urine:1250]  Intake/Output this shift:  No intake/output data recorded. Weight change:  Gen: NAD CVS: no rub Resp: decreased BS at bases Abd: obese, +BS, distended, nontender Ext:+3 bilateral lower extremity edema  Recent Labs  Lab 10/25/18 1726 10/26/18 0543 10/27/18 0613 10/28/18 0646  NA 134* 135 138 139  K 3.9 4.4 4.6 4.4  CL 100 104 106 106  CO2 22 22 23 24   GLUCOSE 124* 86 84 95  BUN 70* 71* 69* 66*  CREATININE 3.94* 3.96* 3.27* 2.84*  ALBUMIN 2.5*  --  2.5* 2.4*  2.4*  CALCIUM 8.2* 7.9* 8.1* 8.2*  PHOS  --   --   --  3.9  AST 29  --  16 21  ALT 14  --  11 9   Liver Function Tests: Recent Labs  Lab 10/25/18 1726 10/27/18 0613 10/28/18 0646  AST 29 16 21   ALT 14 11 9   ALKPHOS 53 29* 48  BILITOT 3.1* 2.5* 2.3*  PROT 6.5 5.2* 5.1*  ALBUMIN 2.5* 2.5* 2.4*  2.4*   No results for input(s): LIPASE, AMYLASE in the last 168 hours. No results for input(s): AMMONIA in the last 168 hours. CBC: Recent Labs  Lab 10/25/18 1726 10/27/18 0613  WBC 9.5 7.1  NEUTROABS 5.3  --   HGB 11.3* 9.7*  HCT 34.2* 29.6*  MCV 99.1 98.0  PLT 213 155   Cardiac Enzymes: No results for input(s): CKTOTAL, CKMB, CKMBINDEX, TROPONINI in the last 168 hours. CBG: Recent Labs  Lab 10/27/18 0757 10/27/18 1126 10/27/18 1645 10/27/18 2124 10/28/18 0718  GLUCAP 70 68* 107* 120* 96    Iron Studies:  Recent Labs    10/26/18 1720  IRON 53  TIBC 99*  FERRITIN 584*    Studies/Results: US Renal  Result Date: 10/26/2018 CLINICAL DATA:  Acute kidney injury EXAM: RENAL / URINARY TRACT ULTRASOUND COMPLETE COMPARISON:  CT abdomen pelvis October 25, 2018 FINDINGS: Right Kidney: Renal measurements: 11.3 x 4.9 x 7.1 cm. = volume: 206 mL. Echogenicity is normal. There is a small anechoic cyst seen within the midpole measuring 0.9 x 1.0 x 0.9 cm. Left Kidney: Renal measurements: 11.8 x 5.2 x 5.8 cm = volume: 187 mL. Echogenicity within normal limits. No mass or hydronephrosis visualized. Bladder: Decompressed due to Foley catheter in place. Abdominopelvic ascites is seen. IMPRESSION: Small right midpole renal cyst.  Otherwise normal appearing kidneys. Electronically Signed   By: Prudencio Pair M.D.   On: 10/26/2018 11:49   US Paracentesis  Result Date: 10/26/2018 INDICATION: Cirrhosis, ascites EXAM: ULTRASOUND GUIDED DIAGNOSTIC AND THERAPEUTIC PARACENTESIS MEDICATIONS: None. COMPLICATIONS: None immediate. PROCEDURE: Procedure, benefits, and risks of procedure were discussed with patient. Written informed consent for procedure was obtained. Time out protocol followed. Adequate collection of ascites localized by ultrasound in RIGHT lower quadrant. Skin prepped and draped in usual sterile fashion. Skin and soft tissues anesthetized  with 10 mL of 1% lidocaine. 5 [place] Yueh catheter placed into peritoneal cavity. 2 L of amber colored ascitic fluid aspirated by vacuum bottle suction. Volume removed limited to 2 L at request of ordering physician. Procedure tolerated well by patient without immediate complication. FINDINGS: A total of approximately 2 L of ascitic fluid was removed. Samples were sent to the laboratory as requested by the clinical team. IMPRESSION: Successful ultrasound-guided paracentesis yielding 2 liters of peritoneal fluid. Electronically Signed   By: Lavonia Dana M.D.   On: 10/26/2018 11:29   . aspirin EC  81 mg Oral QHS  . atorvastatin  10 mg Oral q1800  .  Chlorhexidine Gluconate Cloth  6 each Topical Q0600  . diltiazem  30 mg Oral Q6H  . enoxaparin (LOVENOX) injection  30 mg Subcutaneous Q24H  . insulin aspart  0-15 Units Subcutaneous TID WC  . insulin aspart  0-5 Units Subcutaneous QHS  . levothyroxine  75 mcg Oral QAC breakfast  . nystatin   Topical TID    BMET    Component Value Date/Time   NA 139 10/28/2018 0646   K 4.4 10/28/2018 0646   CL 106 10/28/2018 0646   CO2 24 10/28/2018 0646   GLUCOSE 95 10/28/2018 0646   BUN 66 (H) 10/28/2018 0646   CREATININE 2.84 (H) 10/28/2018 0646   CALCIUM 8.2 (L) 10/28/2018 0646   GFRNONAA 16 (L) 10/28/2018 0646   GFRAA 18 (L) 10/28/2018 0646   CBC    Component Value Date/Time   WBC 7.1 10/27/2018 0613   RBC 3.02 (L) 10/27/2018 0613   HGB 9.7 (L) 10/27/2018 0613   HCT 29.6 (L) 10/27/2018 0613   PLT 155 10/27/2018 0613   MCV 98.0 10/27/2018 0613   MCH 32.1 10/27/2018 0613   MCHC 32.8 10/27/2018 0613   RDW 15.0 10/27/2018 0613   LYMPHSABS 1.8 10/25/2018 1726   MONOABS 1.8 (H) 10/25/2018 1726   EOSABS 0.4 10/25/2018 1726   BASOSABS 0.1 10/25/2018 1726     Assessment/Plan:  1. AKI- in setting of decompensated cirrhosis with anasarca/ascites and intravascular volume depletion in setting of concomitant ARB therapy.  Since stopping the ARB and diuretics, her Cr has continued to improve.  She was restarted on diuretics with good response.   1. Avoid NSAIDs/ACE/ARB or nephrotoxic agents such as IV contrast 2. Limit large volume paracenteses to 2 liters and administer albumin 3. continue to follow UOP and Scr  4. Sodium restriction 2 grams/day 5. Fluid restriction to 1.5 liters/day 2. Cirrhosis- new diagnosis presumably due to hepatic steatosis. Workup underway per GI.  Hepatitis panel negative. 3. Anasarca- ok to restart IV furosemide 80 mg tomorrow (last dose 10/25/18) and follow response with a goal UF of 1-2 liters/day to help limit further ischemic injury related to hemodynamic shifts.    4. Hyponatremia- hypervolemic.  Improved 5. Anemia- normal iron stores.  Unclear if she has had variceal bleeding.  GI following.  Donetta Potts, MD [employer] [phone]

## 2018-10-29 LAB — COMPREHENSIVE METABOLIC PANEL
ALT: 10 U/L (ref 0–44)
AST: 18 U/L (ref 15–41)
Albumin: 3.1 g/dL — ABNORMAL LOW (ref 3.5–5.0)
Alkaline Phosphatase: 28 U/L — ABNORMAL LOW (ref 38–126)
Anion gap: 8 (ref 5–15)
BUN: 61 mg/dL — ABNORMAL HIGH (ref 8–23)
CO2: 23 mmol/L (ref 22–32)
Calcium: 8.2 mg/dL — ABNORMAL LOW (ref 8.9–10.3)
Chloride: 107 mmol/L (ref 98–111)
Creatinine, Ser: 2.58 mg/dL — ABNORMAL HIGH (ref 0.44–1.00)
GFR calc Af Amer: 20 mL/min — ABNORMAL LOW (ref >60)
GFR calc non Af Amer: 18 mL/min — ABNORMAL LOW (ref >60)
Glucose, Bld: 107 mg/dL — ABNORMAL HIGH (ref 70–99)
Potassium: 4.1 mmol/L (ref 3.5–5.1)
Sodium: 138 mmol/L (ref 135–145)
Total Bilirubin: 2.3 mg/dL — ABNORMAL HIGH (ref 0.3–1.2)
Total Protein: 5.3 g/dL — ABNORMAL LOW (ref 6.5–8.1)

## 2018-10-29 LAB — CBC
HCT: 28.2 % — ABNORMAL LOW (ref 36.0–46.0)
Hemoglobin: 9.2 g/dL — ABNORMAL LOW (ref 12.0–15.0)
MCH: 32.1 pg (ref 26.0–34.0)
MCHC: 32.6 g/dL (ref 30.0–36.0)
MCV: 98.3 fL (ref 80.0–100.0)
Platelets: 129 10*3/uL — ABNORMAL LOW (ref 150–400)
RBC: 2.87 MIL/uL — ABNORMAL LOW (ref 3.87–5.11)
RDW: 14.9 % (ref 11.5–15.5)
WBC: 7.1 10*3/uL (ref 4.0–10.5)
nRBC: 0 % (ref 0.0–0.2)

## 2018-10-29 LAB — RENAL FUNCTION PANEL
Albumin: 3 g/dL — ABNORMAL LOW (ref 3.5–5.0)
Anion gap: 6 (ref 5–15)
BUN: 62 mg/dL — ABNORMAL HIGH (ref 8–23)
CO2: 24 mmol/L (ref 22–32)
Calcium: 8.2 mg/dL — ABNORMAL LOW (ref 8.9–10.3)
Chloride: 108 mmol/L (ref 98–111)
Creatinine, Ser: 2.58 mg/dL — ABNORMAL HIGH (ref 0.44–1.00)
GFR calc Af Amer: 20 mL/min — ABNORMAL LOW (ref >60)
GFR calc non Af Amer: 18 mL/min — ABNORMAL LOW (ref >60)
Glucose, Bld: 106 mg/dL — ABNORMAL HIGH (ref 70–99)
Phosphorus: 3.3 mg/dL (ref 2.5–4.6)
Potassium: 4.2 mmol/L (ref 3.5–5.1)
Sodium: 138 mmol/L (ref 135–145)

## 2018-10-29 LAB — GLUCOSE, CAPILLARY
Glucose-Capillary: 97 mg/dL (ref 70–99)
Glucose-Capillary: 152 mg/dL — ABNORMAL HIGH (ref 70–99)
Glucose-Capillary: 140 mg/dL — ABNORMAL HIGH (ref 70–99)
Glucose-Capillary: 127 mg/dL — ABNORMAL HIGH (ref 70–99)

## 2018-10-29 LAB — AFP TUMOR MARKER: AFP, Serum, Tumor Marker: 6.6 ng/mL (ref 0.0–8.3)

## 2018-10-29 LAB — PROTIME-INR
INR: 2 — ABNORMAL HIGH (ref 0.8–1.2)
Prothrombin Time: 22.8 seconds — ABNORMAL HIGH (ref 11.4–15.2)

## 2018-10-29 MED ORDER — FUROSEMIDE 10 MG/ML IJ SOLN
80.0000 mg | Freq: Two times a day (BID) | INTRAMUSCULAR | Status: DC
  Administered 2018-10-29 – 2018-10-30 (×2): 80 mg via INTRAVENOUS
  Filled 2018-10-29 (×2): qty 8

## 2018-10-29 NOTE — Progress Notes (Addendum)
Subjective:  Feels better. Got up several times yesterday and this morning. Eating more.   Objective: Vital signs in last 24 hours: Temp:  [98.4 F (36.9 C)-98.8 F (37.1 C)] 98.4 F (36.9 C) (08/06 0453) Pulse Rate:  [84-121] 84 (08/06 0453) Resp:  [18-20] 20 (08/06 0453) BP: (103-118)/(43-48) 114/48 (08/06 0453) SpO2:  [88 %-99 %] 99 % (08/06 0453) Weight:  [100.2 kg] 100.2 kg (08/06 0453) Last BM Date: 10/25/18 General:   Alert,  Well-developed, well-nourished, pleasant and cooperative in NAD Head:  Normocephalic and atraumatic. Eyes:  Sclera clear, no icterus.  Abdomen:  Soft, nontender, moderate distention but not tense.Normal bowel sounds, without guarding, and without rebound.   Extremities:  2+ pitting edema to knees, 3+ at ankles bilaterally. Erythema to bilateral anterior lower legs, left greater than right, no significant change.  Neurologic:  Alert and  oriented x4;  grossly normal neurologically. Skin:  Intact without significant lesions or rashes. Psych:  Alert and cooperative. Normal mood and affect.  Intake/Output from previous day: 08/05 0701 - 08/06 0700 In: 920 [P.O.:720; IV Piggyback:200] Out: 800 [Urine:800] Intake/Output this shift: No intake/output data recorded.  Lab Results: CBC Recent Labs    10/27/18 0613 10/29/18 0510  WBC 7.1 7.1  HGB 9.7* 9.2*  HCT 29.6* 28.2*  MCV 98.0 98.3  PLT 155 129*   BMET Recent Labs    10/27/18 0613 10/28/18 0646 10/29/18 0510  NA 138 139 138  138  K 4.6 4.4 4.2  4.1  CL 106 106 108  107  CO2 23 24 24  23   GLUCOSE 84 95 106*  107*  BUN 69* 66* 62*  61*  CREATININE 3.27* 2.84* 2.58*  2.58*  CALCIUM 8.1* 8.2* 8.2*  8.2*   LFTs Recent Labs    10/27/18 0613 10/28/18 0646 10/29/18 0510  BILITOT 2.5* 2.3* 2.3*  BILIDIR  --  0.7*  --   IBILI  --  1.6*  --   ALKPHOS 29* 48 28*  AST 16 21 18   ALT 11 9 10   PROT 5.2* 5.1* 5.3*  ALBUMIN 2.5* 2.4*  2.4* 3.0*  3.1*   No results for input(s):  LIPASE in the last 72 hours. PT/INR Recent Labs    10/27/18 0613  LABPROT 23.9*  INR 2.2*      Imaging Studies: Ct Abdomen Pelvis Wo Contrast  Result Date: 10/25/2018 CLINICAL DATA:  Acute renal injury, history of prior hysterectomy EXAM: CT ABDOMEN AND PELVIS WITHOUT CONTRAST TECHNIQUE: Multidetector CT imaging of the abdomen and pelvis was performed following the standard protocol without IV contrast. COMPARISON:  04/22/2018 FINDINGS: Lower chest: Large left pleural effusion is noted with underlying left basilar atelectasis. The right lung base is clear. Hepatobiliary: Shrunken liver with nodular contour consistent with underlying cirrhosis. No focal mass is noted. The gallbladder is well distended with dependent gallstones similar to that noted on the prior exam. Pancreas: Unremarkable. No pancreatic ductal dilatation or surrounding inflammatory changes. Spleen: Normal in size without focal abnormality. Adrenals/Urinary Tract: Adrenal glands are within normal limits. Small exophytic cyst is noted arising from the right kidney similar to that seen on prior exam. No renal calculi or obstructive changes are seen. The bladder is decompressed by Foley catheter. Stomach/Bowel: Appendix is not well visualized although no inflammatory changes are seen. No obstructive or inflammatory changes of large or small bowel are seen. The stomach is within normal limits. Vascular/Lymphatic: Aortic atherosclerosis. No enlarged abdominal or pelvic lymph nodes. Reproductive: Uterus is been surgically  removed consistent with the given clinical history. Other: Significant ascites is noted new from the prior exam. This may be related to the underlying portal hypertension. Musculoskeletal: No acute or significant osseous findings. IMPRESSION: Changes of hepatic cirrhosis and significant ascites which is new from the prior exam. New moderate left-sided pleural effusion. Electronically Signed   By: Inez Catalina M.D.   On:  10/25/2018 21:34   Dg Chest 2 View  Result Date: 10/25/2018 CLINICAL DATA:  Shortness of breath EXAM: CHEST - 2 VIEW COMPARISON:  Chest CT April 22, 2018 FINDINGS: There is airspace consolidation in the left lower lobe with small left pleural effusion. The lungs elsewhere are clear. Heart size and pulmonary vascularity are normal. No adenopathy. No bone lesions. IMPRESSION: Left lower lobe airspace consolidation, felt to represent a degree of pneumonia. Small left pleural effusion. Lungs elsewhere clear. Cardiac silhouette normal. No adenopathy. Followup PA and lateral chest radiographs recommended in 3-4 weeks following trial of antibiotic therapy to ensure resolution and exclude underlying malignancy. Electronically Signed   By: Lowella Grip III M.D.   On: 10/25/2018 17:31   US Renal  Result Date: 10/26/2018 CLINICAL DATA:  Acute kidney injury EXAM: RENAL / URINARY TRACT ULTRASOUND COMPLETE COMPARISON:  CT abdomen pelvis October 25, 2018 FINDINGS: Right Kidney: Renal measurements: 11.3 x 4.9 x 7.1 cm. = volume: 206 mL. Echogenicity is normal. There is a small anechoic cyst seen within the midpole measuring 0.9 x 1.0 x 0.9 cm. Left Kidney: Renal measurements: 11.8 x 5.2 x 5.8 cm = volume: 187 mL. Echogenicity within normal limits. No mass or hydronephrosis visualized. Bladder: Decompressed due to Foley catheter in place. Abdominopelvic ascites is seen. IMPRESSION: Small right midpole renal cyst.  Otherwise normal appearing kidneys. Electronically Signed   By: Prudencio Pair M.D.   On: 10/26/2018 11:49   US Paracentesis  Result Date: 10/26/2018 INDICATION: Cirrhosis, ascites EXAM: ULTRASOUND GUIDED DIAGNOSTIC AND THERAPEUTIC PARACENTESIS MEDICATIONS: None. COMPLICATIONS: None immediate. PROCEDURE: Procedure, benefits, and risks of procedure were discussed with patient. Written informed consent for procedure was obtained. Time out protocol followed. Adequate collection of ascites localized by ultrasound  in RIGHT lower quadrant. Skin prepped and draped in usual sterile fashion. Skin and soft tissues anesthetized with 10 mL of 1% lidocaine. 5 [place] Yueh catheter placed into peritoneal cavity. 2 L of amber colored ascitic fluid aspirated by vacuum bottle suction. Volume removed limited to 2 L at request of ordering physician. Procedure tolerated well by patient without immediate complication. FINDINGS: A total of approximately 2 L of ascitic fluid was removed. Samples were sent to the laboratory as requested by the clinical team. IMPRESSION: Successful ultrasound-guided paracentesis yielding 2 liters of peritoneal fluid. Electronically Signed   By: Lavonia Dana M.D.   On: 10/26/2018 11:29  [2 weeks]   Assessment: Pleasant 73 y/o female with newly diagnosed decompensated cirrhosis with ascites/anasarca, pleural effusion, coagulopathy, and ARF in setting of diuretic therapy. MELD Na 33 on admission. Work up includes negative serologies for Hep B, Hep C, autoimmune, alpha 1 antitrypsin deficiency. Ferritin and iron sats moderately elevated likely nonspecific but hemochromatosis genetic markers pending. 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   Ascites/anasarca in setting of decompensated cirrhosis. Negative fluid analysis for SBP. If additional paras needed, limit to 2 liters per nephrology recommendations. F/u pending cultures. Diuretic management per nephrology.   Normocytic anemia: no overt GI bleeding. Multifactorial. H/H stable.    Plan: 1. Outpatient EGD for variceal screening.  2. Outpatient f/u for further cirrhosis care:  will need Hep A/B vaccinations.  3. 2 gram sodium restricted diet.  4. F/u pending fluid analysis. 5. Await inr.  6. Will follow peripherally.   Laureen Ochs. Bernarda Caffey [facility] [phone] 8/6/20209:19 AM     LOS: 4 days    Addendum: MELD 26 today. Patient has follow up with [facility] in four weeks.   Laureen Ochs. Bernarda Caffey [facility] [phone] 8/6/202012:59 PM

## 2018-10-29 NOTE — Progress Notes (Signed)
PROGRESS NOTE  Jordan Le OQH:[id number] DOB: 24-Jul-1945 DOA: 10/25/2018 PCP: Manon Hilding, MD  Brief History:  73 year old female with a history of diabetes mellitus, hypertension, liver cirrhosis, endometrial carcinoma status post hysterectomy February 2020 presented with 68-month history of shortness of breath and dyspnea on exertion as well as decreased oral intake.  She has also complained of increasing peripheral edema and increasing abdominal girth she states that she usually sleeps in a recliner, but is unable to tell me whether she truly has orthopnea or not because she states that she has never tried to lay flat in her life.  She denied any fevers, chills, headache, chest pain, coughing, hemoptysis, nausea, vomiting, diarrhea, abdominal pain.  Notably, the patient states that she was started on furosemide 20 mg daily in early April 2020.  Her dose of furosemide was subsequently increased to 40 mg daily in mid May 2020.  Approximately 3 weeks prior to this admission, spironolactone was added.  She denies any outpatient NSAID use.  She continues on losartan.  The patient states that she was told about 1 month prior to this admission that she had a problem with her kidneys, but this was not investigated further.  In the ED, the patient was afebrile hemodynamically stable saturating 94-96% room air.  Serum creatinine was 3.94.  WBC 9.5, hemoglobin 11.3, platelets 213,000.  CT of the abdomen pelvis showed a large left pleural effusion with left basilar atelectasis.  There was a shrunken nodular liver consistent with liver cirrhosis.  There was no bowel wall thickening.  There was significant ascites.  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 -10/26/18--paracentesis--2L removed -Hepatitis B surface antigen--neg -Hepatitis C antibody--neg -Antimitochondrial antibody negative -Anti-smooth muscle antibody negative -Alpha-1  antitrypsin level--normal -Alpha-fetoprotein pending -ANA negative -Initially furosemide and spironolactone were discontinued due to  AKI -10/26/18 paracentesis--3 L removed--> albumin given after paracentesis  -Continue ceftriaxone for SBP prophylaxis -Per nephrologist start IV Lasix 80 mg on 8/6  Acute kidney injury -Likely due to hemodynamic changes in the setting of furosemide, spironolactone, losartan -Urine sodium and creatinine <1 -Renal ultrasound negative for hydronephrosis -Nephrology consultation appreciated -urine protein/creatinine ratio--0.25  Left pleural effusion -r -per Dr. Carmelina Dane overall small--defer Inocencio Homes for now -reassess for thora if respiratory status worsens again -echo shows normal EF -suspect hepatothorax  SVT/Atrial tachycardia -personally reviewed EKG--SVT, nonspecific T wave change -start po diltiazem -Echo shows normal EF -TSH 5.4  LLE Stasis Dermatitis -doubt cellulitis -present x 3 months -already on ceftriaxone without change -Should hopefully improve that she diuresis  Diabetes mellitus type 2 -8/3/20Hemoglobin A1c--4.7 -NovoLog sliding scale -Discontinued metformin  Hypothyroidism -Continue Synthroid  Endometrial carcinoma -Status post hysterectomy 05/07/2018 -follow up Dr. Denman George  Hyperlipidemia -continue statin   Disposition Plan:   Needs iv Lasix and iv Albulmin Family Communication:   Granddaughter updated peviously Consultants:  GI, renal  Code Status:  FULL   DVT Prophylaxis:   [place] Lovenox    Procedures: As Listed in Progress Note Above  Antibiotics: Ceftriaxone 8/2>>>8/3   Subjective: No nausea, vomiting, diarrhea  Shortness of breath and dyspnea on exertion persist  Objective: Vitals:   10/28/18 2006 10/28/18 2122 10/29/18 0453 10/29/18 1440  BP:  (!) 118/44 (!) 114/48 (!) 99/40  Pulse:  (!) 121 84 82  Resp:  20 20 18   Temp:  98.8 F (37.1 C) 98.4 F (36.9 C) 98.3 F (36.8 C)    TempSrc:  Oral Oral Oral  SpO2: (!) 88% 99% 99% 98%  Weight:   100.2 kg   Height:        Intake/Output Summary (Last 24 hours) at 10/29/2018 1901 Last data filed at 10/29/2018 1841 Gross per 24 hour  Intake 920 ml  Output 650 ml  Net 270 ml   Weight change:  Exam:  General exam: Alert, awake, oriented x 3 Respiratory system: Crackles at bases. Respiratory effort normal. Cardiovascular system:RRR. No murmurs, rubs, gallops. Gastrointestinal system: Abdomen is distended, soft and nontender. No organomegaly or masses felt. Normal bowel sounds heard. Central nervous system: Alert and oriented. No focal neurological deficits. Extremities: 2+ edema lower extremities bilaterally Skin: Venous stasis changes in lower extremities bilaterally  Psychiatry: Judgement and insight appear normal. Mood & affect appropriate.   Data Reviewed: I have personally reviewed following labs and imaging studies Basic Metabolic Panel: Recent Labs  Lab 10/25/18 1726 10/26/18 0543 10/27/18 0613 10/28/18 0646 10/29/18 0510  NA 134* 135 138 139 138   138  K 3.9 4.4 4.6 4.4 4.2   4.1  CL 100 104 106 106 108   107  CO2 22 22 23 24 24   23   GLUCOSE 124* 86 84 95 106*   107*  BUN 70* 71* 69* 66* 62*   61*  CREATININE 3.94* 3.96* 3.27* 2.84* 2.58*   2.58*  CALCIUM 8.2* 7.9* 8.1* 8.2* 8.2*   8.2*  MG 2.4  --   --   --   --   PHOS  --   --   --  3.9 3.3   Liver Function Tests: Recent Labs  Lab 10/25/18 1726 10/27/18 0613 10/28/18 0646 10/29/18 0510  AST 29 16 21 18   ALT 14 11 9 10   ALKPHOS 53 29* 48 28*  BILITOT 3.1* 2.5* 2.3* 2.3*  PROT 6.5 5.2* 5.1* 5.3*  ALBUMIN 2.5* 2.5* 2.4*   2.4* 3.0*   3.1*   No results for input(s): LIPASE, AMYLASE in the last 168 hours. No results for input(s): AMMONIA in the last 168 hours. Coagulation Profile: Recent Labs  Lab 10/26/18 0543 10/27/18 0613 10/29/18 0917  INR 2.1* 2.2* 2.0*   CBC: Recent Labs  Lab 10/25/18 1726 10/27/18 0613 10/29/18 0510   WBC 9.5 7.1 7.1  NEUTROABS 5.3  --   --   HGB 11.3* 9.7* 9.2*  HCT 34.2* 29.6* 28.2*  MCV 99.1 98.0 98.3  PLT 213 155 129*   Cardiac Enzymes: No results for input(s): CKTOTAL, CKMB, CKMBINDEX, TROPONINI in the last 168 hours. BNP: Invalid input(s): POCBNP CBG: Recent Labs  Lab 10/28/18 1628 10/28/18 2147 10/29/18 0719 10/29/18 1136 10/29/18 1609  GLUCAP 111* 130* 97 152* 140*   HbA1C: No results for input(s): HGBA1C in the last 72 hours. Urine analysis:    Component Value Date/Time   COLORURINE AMBER (A) 10/25/2018 2100   APPEARANCEUR CLOUDY (A) 10/25/2018 2100   LABSPEC 1.013 10/25/2018 2100   PHURINE 5.0 10/25/2018 2100   GLUCOSEU NEGATIVE 10/25/2018 2100   HGBUR SMALL (A) 10/25/2018 2100   [place] NEGATIVE 10/25/2018 2100   [place] NEGATIVE 10/25/2018 2100   PROTEINUR NEGATIVE 10/25/2018 2100   NITRITE NEGATIVE 10/25/2018 2100   LEUKOCYTESUR SMALL (A) 10/25/2018 2100   Sepsis Labs: @LABRCNTIP (procalcitonin:4,lacticidven:4) ) Recent Results (from the past 240 hour(s))  SARS CORONAVIRUS 2 Nasal Swab Aptima Multi Swab     Status: None   Collection Time: 10/25/18  5:23 PM   Specimen: Aptima Multi Swab; Nasal Swab  Result Value Ref Range Status   SARS Coronavirus 2 NEGATIVE NEGATIVE Final    Comment: (NOTE) SARS-CoV-2 target nucleic acids are NOT DETECTED. The SARS-CoV-2 RNA is generally detectable in upper and lower respiratory specimens during the acute phase of infection. Negative results do not preclude SARS-CoV-2 infection, do not rule out co-infections with other pathogens, and should not be used as the sole basis for treatment or other patient management decisions. Negative results must be combined with clinical observations, patient history, and epidemiological information. The expected result is Negative. Fact Sheet for Patients: SugarRoll.be Fact Sheet for Healthcare  Providers: https://www.woods-mathews.com/ This test is not yet approved or cleared by the [place] FDA and  has been authorized for detection and/or diagnosis of SARS-CoV-2 by FDA under an Emergency Use Authorization (EUA). This EUA will remain  in effect (meaning this test can be used) for the duration of the COVID-19 declaration under Section 56 4(b)(1) of the Act, 21 U.S.C. section 360bbb-3(b)(1), unless the authorization is terminated or revoked sooner. Performed at [facility], [place] [address] [place], [place] [address]   Urine culture     Status: None   Collection Time: 10/25/18  9:45 PM   Specimen: Urine, Catheterized  Result Value Ref Range Status   Specimen Description   Final    URINE, CATHETERIZED Performed at [facility], [address] [place], [place] [address]    Special Requests   Final    NONE Performed at [facility], [address] [place], [place] [address]    Culture   Final    NO GROWTH Performed at [facility], [place] [address], [place], [place] [address]    Report Status 10/27/2018 FINAL  Final  MRSA PCR Screening     Status: None   Collection Time: 10/25/18 11:17 PM   Specimen: Nasal Mucosa; Nasopharyngeal  Result Value Ref Range Status   MRSA by PCR NEGATIVE NEGATIVE Final    Comment:        The GeneXpert MRSA Assay (FDA approved for NASAL specimens only), is one component of a comprehensive MRSA colonization surveillance program. It is not intended to diagnose MRSA infection nor to guide or monitor treatment for MRSA infections. Performed at [facility], [address] [place], [place] [address]   Culture, body fluid-bottle     Status: None (Preliminary result)   Collection Time: 10/26/18 10:26 AM   Specimen: Ascitic  Result Value Ref Range Status   Specimen Description ASCITIC  Final   Special Requests BOTTLES DRAWN AEROBIC AND ANAEROBIC 10 CC EACH  Final   Culture   Final    NO GROWTH 3 DAYS Performed  at [facility], [address], [place], [place] [address]    Report Status PENDING  Incomplete  Gram stain     Status: None   Collection Time: 10/26/18 10:26 AM   Specimen: Ascitic  Result Value Ref Range Status   Specimen Description ASCITIC  Final   Special Requests NONE  Final   Gram Stain   Final    NO ORGANISMS SEEN WBC PRESENT, PREDOMINANTLY MONONUCLEAR CYTOSPIN SMEAR Performed at [facility], [address] [place], [place] [address]    Report Status 10/26/2018 FINAL  Final     Scheduled Meds:  aspirin EC  81 mg Oral QHS   atorvastatin  10 mg Oral q1800   Chlorhexidine Gluconate Cloth  6 each Topical Q0600   diltiazem  30 mg Oral Q6H   enoxaparin (LOVENOX)  injection  30 mg Subcutaneous Q24H   furosemide  80 mg Intravenous BID   insulin aspart  0-15 Units Subcutaneous TID WC   insulin aspart  0-5 Units Subcutaneous QHS   levothyroxine  75 mcg Oral QAC breakfast   nystatin   Topical TID   Continuous Infusions:  cefTRIAXone (ROCEPHIN)  IV Stopped (10/28/18 2204)    Procedures/Studies: Ct Abdomen Pelvis Wo Contrast  Result Date: 10/25/2018 CLINICAL DATA:  Acute renal injury, history of prior hysterectomy EXAM: CT ABDOMEN AND PELVIS WITHOUT CONTRAST TECHNIQUE: Multidetector CT imaging of the abdomen and pelvis was performed following the standard protocol without IV contrast. COMPARISON:  04/22/2018 FINDINGS: Lower chest: Large left pleural effusion is noted with underlying left basilar atelectasis. The right lung base is clear. Hepatobiliary: Shrunken liver with nodular contour consistent with underlying cirrhosis. No focal mass is noted. The gallbladder is well distended with dependent gallstones similar to that noted on the prior exam. Pancreas: Unremarkable. No pancreatic ductal dilatation or surrounding inflammatory changes. Spleen: Normal in size without focal abnormality. Adrenals/Urinary Tract: Adrenal glands are within normal limits. Small exophytic cyst is  noted arising from the right kidney similar to that seen on prior exam. No renal calculi or obstructive changes are seen. The bladder is decompressed by Foley catheter. Stomach/Bowel: Appendix is not well visualized although no inflammatory changes are seen. No obstructive or inflammatory changes of large or small bowel are seen. The stomach is within normal limits. Vascular/Lymphatic: Aortic atherosclerosis. No enlarged abdominal or pelvic lymph nodes. Reproductive: Uterus is been surgically removed consistent with the given clinical history. Other: Significant ascites is noted new from the prior exam. This may be related to the underlying portal hypertension. Musculoskeletal: No acute or significant osseous findings. IMPRESSION: Changes of hepatic cirrhosis and significant ascites which is new from the prior exam. New moderate left-sided pleural effusion. Electronically Signed   By: Inez Catalina M.D.   On: 10/25/2018 21:34   Dg Chest 2 View  Result Date: 10/25/2018 CLINICAL DATA:  Shortness of breath EXAM: CHEST - 2 VIEW COMPARISON:  Chest CT April 22, 2018 FINDINGS: There is airspace consolidation in the left lower lobe with small left pleural effusion. The lungs elsewhere are clear. Heart size and pulmonary vascularity are normal. No adenopathy. No bone lesions. IMPRESSION: Left lower lobe airspace consolidation, felt to represent a degree of pneumonia. Small left pleural effusion. Lungs elsewhere clear. Cardiac silhouette normal. No adenopathy. Followup PA and lateral chest radiographs recommended in 3-4 weeks following trial of antibiotic therapy to ensure resolution and exclude underlying malignancy. Electronically Signed   By: Lowella Grip III M.D.   On: 10/25/2018 17:31   US Renal  Result Date: 10/26/2018 CLINICAL DATA:  Acute kidney injury EXAM: RENAL / URINARY TRACT ULTRASOUND COMPLETE COMPARISON:  CT abdomen pelvis October 25, 2018 FINDINGS: Right Kidney: Renal measurements: 11.3 x 4.9 x 7.1  cm. = volume: 206 mL. Echogenicity is normal. There is a small anechoic cyst seen within the midpole measuring 0.9 x 1.0 x 0.9 cm. Left Kidney: Renal measurements: 11.8 x 5.2 x 5.8 cm = volume: 187 mL. Echogenicity within normal limits. No mass or hydronephrosis visualized. Bladder: Decompressed due to Foley catheter in place. Abdominopelvic ascites is seen. IMPRESSION: Small right midpole renal cyst.  Otherwise normal appearing kidneys. Electronically Signed   By: Prudencio Pair M.D.   On: 10/26/2018 11:49   US Paracentesis  Result Date: 10/26/2018 INDICATION: Cirrhosis, ascites EXAM: ULTRASOUND GUIDED DIAGNOSTIC AND THERAPEUTIC PARACENTESIS MEDICATIONS: None.  COMPLICATIONS: None immediate. PROCEDURE: Procedure, benefits, and risks of procedure were discussed with patient. Written informed consent for procedure was obtained. Time out protocol followed. Adequate collection of ascites localized by ultrasound in RIGHT lower quadrant. Skin prepped and draped in usual sterile fashion. Skin and soft tissues anesthetized with 10 mL of 1% lidocaine. 5 [place] Yueh catheter placed into peritoneal cavity. 2 L of amber colored ascitic fluid aspirated by vacuum bottle suction. Volume removed limited to 2 L at request of ordering physician. Procedure tolerated well by patient without immediate complication. FINDINGS: A total of approximately 2 L of ascitic fluid was removed. Samples were sent to the laboratory as requested by the clinical team. IMPRESSION: Successful ultrasound-guided paracentesis yielding 2 liters of peritoneal fluid. Electronically Signed   By: Lavonia Dana M.D.   On: 10/26/2018 11:29    Roxan Hockey, MD  [facility]   If 7PM-7AM, please contact night-coverage [email or web address]  10/29/2018, 7:01 PM   LOS: 4 days

## 2018-10-29 NOTE — Progress Notes (Signed)
Patient ID: LAKIAH DHINGRA, female   DOB: 09-10-1945, 73 y.o.   MRN: [id number] S: No new complaints O:BP (!) 114/48   Pulse 84   Temp 98.4 F (36.9 C) (Oral)   Resp 20   Ht 5\' 3"  (1.6 m)   Wt 100.2 kg   SpO2 99%   BMI 39.13 kg/m   Intake/Output Summary (Last 24 hours) at 10/29/2018 1022 Last data filed at 10/29/2018 0859 Gross per 24 hour  Intake 920 ml  Output 900 ml  Net 20 ml   Intake/Output: I/O last 3 completed shifts: In: 52 [P.O.:720; IV Piggyback:200] Out: 0165 [Urine:1550]  Intake/Output this shift:  Total I/O In: 240 [P.O.:240] Out: 100 [Urine:100] Weight change:  Gen: NAD sitting upright in chair CVS: no rub Resp: decreased BS at bases Abd: obese, +BS Ext:3+ anasarca  Recent Labs  Lab 10/25/18 1726 10/26/18 0543 10/27/18 0613 10/28/18 0646 10/29/18 0510  NA 134* 135 138 139 138  138  K 3.9 4.4 4.6 4.4 4.2  4.1  CL 100 104 106 106 108  107  CO2 22 22 23 24 24  23   GLUCOSE 124* 86 84 95 106*  107*  BUN 70* 71* 69* 66* 62*  61*  CREATININE 3.94* 3.96* 3.27* 2.84* 2.58*  2.58*  ALBUMIN 2.5*  --  2.5* 2.4*  2.4* 3.0*  3.1*  CALCIUM 8.2* 7.9* 8.1* 8.2* 8.2*  8.2*  PHOS  --   --   --  3.9 3.3  AST 29  --  16 21 18   ALT 14  --  11 9 10    Liver Function Tests: Recent Labs  Lab 10/27/18 0613 10/28/18 0646 10/29/18 0510  AST 16 21 18   ALT 11 9 10   ALKPHOS 29* 48 28*  BILITOT 2.5* 2.3* 2.3*  PROT 5.2* 5.1* 5.3*  ALBUMIN 2.5* 2.4*  2.4* 3.0*  3.1*   No results for input(s): LIPASE, AMYLASE in the last 168 hours. No results for input(s): AMMONIA in the last 168 hours. CBC: Recent Labs  Lab 10/25/18 1726 10/27/18 0613 10/29/18 0510  WBC 9.5 7.1 7.1  NEUTROABS 5.3  --   --   HGB 11.3* 9.7* 9.2*  HCT 34.2* 29.6* 28.2*  MCV 99.1 98.0 98.3  PLT 213 155 129*   Cardiac Enzymes: No results for input(s): CKTOTAL, CKMB, CKMBINDEX, TROPONINI in the last 168 hours. CBG: Recent Labs  Lab 10/28/18 0718 10/28/18 1115 10/28/18 1628  10/28/18 2147 10/29/18 0719  GLUCAP 96 121* 111* 130* 97    Iron Studies:  Recent Labs    10/26/18 1720  IRON 53  TIBC 99*  FERRITIN 584*   Studies/Results: No results found. Marland Kitchen aspirin EC  81 mg Oral QHS  . atorvastatin  10 mg Oral q1800  . Chlorhexidine Gluconate Cloth  6 each Topical Q0600  . diltiazem  30 mg Oral Q6H  . enoxaparin (LOVENOX) injection  30 mg Subcutaneous Q24H  . insulin aspart  0-15 Units Subcutaneous TID WC  . insulin aspart  0-5 Units Subcutaneous QHS  . levothyroxine  75 mcg Oral QAC breakfast  . nystatin   Topical TID    BMET    Component Value Date/Time   NA 138 10/29/2018 0510   NA 138 10/29/2018 0510   K 4.2 10/29/2018 0510   K 4.1 10/29/2018 0510   CL 108 10/29/2018 0510   CL 107 10/29/2018 0510   CO2 24 10/29/2018 0510   CO2 23 10/29/2018 0510   GLUCOSE 106 (  H) 10/29/2018 0510   GLUCOSE 107 (H) 10/29/2018 0510   BUN 62 (H) 10/29/2018 0510   BUN 61 (H) 10/29/2018 0510   CREATININE 2.58 (H) 10/29/2018 0510   CREATININE 2.58 (H) 10/29/2018 0510   CALCIUM 8.2 (L) 10/29/2018 0510   CALCIUM 8.2 (L) 10/29/2018 0510   GFRNONAA 18 (L) 10/29/2018 0510   GFRNONAA 18 (L) 10/29/2018 0510   GFRAA 20 (L) 10/29/2018 0510   GFRAA 20 (L) 10/29/2018 0510   CBC    Component Value Date/Time   WBC 7.1 10/29/2018 0510   RBC 2.87 (L) 10/29/2018 0510   HGB 9.2 (L) 10/29/2018 0510   HCT 28.2 (L) 10/29/2018 0510   PLT 129 (L) 10/29/2018 0510   MCV 98.3 10/29/2018 0510   MCH 32.1 10/29/2018 0510   MCHC 32.6 10/29/2018 0510   RDW 14.9 10/29/2018 0510   LYMPHSABS 1.8 10/25/2018 1726   MONOABS 1.8 (H) 10/25/2018 1726   EOSABS 0.4 10/25/2018 1726   BASOSABS 0.1 10/25/2018 1726    Assessment/Plan:  1. AKI- in setting of decompensated cirrhosis with anasarca/ascites and intravascular volume depletion in setting of concomitant ARB therapy.  Since stopping the ARB and diuretics, her Cr has continued to improve from a peak 3.96 down to 2.54   1. Avoid  NSAIDs/ACE/ARB or nephrotoxic agents such as IV contrast 2. Limit large volume paracenteses to 2 liters and administer albumin 3. continue to follow UOP and Scr  4. Sodium restriction 2 grams/day 5. Fluid restriction to 1.5 liters/day 2. Cirrhosis- new diagnosis presumably due to hepatic steatosis. Workup underway per GI.  Hepatitis panel negative. 3. Anasarca- will restart IV furosemide 80 mg today (last dose 10/25/18) and follow response with a goal UF of 1-2 liters/day to help limit further ischemic injury related to hemodynamic shifts.   4. Hyponatremia- hypervolemic.  Improved 5. Anemia- normal iron stores.  Unclear if she has had variceal bleeding.  GI following.  Donetta Potts, MD [employer] [phone]

## 2018-10-29 NOTE — Progress Notes (Signed)
Dressing changed to right abd paracentesis puncture site.  Serosanguinous drainage.

## 2018-10-30 LAB — GLUCOSE, CAPILLARY
Glucose-Capillary: 170 mg/dL — ABNORMAL HIGH (ref 70–99)
Glucose-Capillary: 147 mg/dL — ABNORMAL HIGH (ref 70–99)
Glucose-Capillary: 116 mg/dL — ABNORMAL HIGH (ref 70–99)
Glucose-Capillary: 118 mg/dL — ABNORMAL HIGH (ref 70–99)

## 2018-10-30 LAB — RENAL FUNCTION PANEL
Albumin: 2.8 g/dL — ABNORMAL LOW (ref 3.5–5.0)
Anion gap: 6 (ref 5–15)
BUN: 58 mg/dL — ABNORMAL HIGH (ref 8–23)
CO2: 25 mmol/L (ref 22–32)
Calcium: 8.1 mg/dL — ABNORMAL LOW (ref 8.9–10.3)
Chloride: 107 mmol/L (ref 98–111)
Creatinine, Ser: 2.47 mg/dL — ABNORMAL HIGH (ref 0.44–1.00)
GFR calc Af Amer: 22 mL/min — ABNORMAL LOW (ref >60)
GFR calc non Af Amer: 19 mL/min — ABNORMAL LOW (ref >60)
Glucose, Bld: 105 mg/dL — ABNORMAL HIGH (ref 70–99)
Phosphorus: 3.3 mg/dL (ref 2.5–4.6)
Potassium: 4.3 mmol/L (ref 3.5–5.1)
Sodium: 138 mmol/L (ref 135–145)

## 2018-10-30 MED ORDER — FUROSEMIDE 10 MG/ML IJ SOLN
120.0000 mg | Freq: Two times a day (BID) | INTRAVENOUS | Status: DC
  Administered 2018-10-30 – 2018-10-31 (×2): 120 mg via INTRAVENOUS
  Filled 2018-10-30 (×6): qty 12

## 2018-10-30 NOTE — Care Management Important Message (Signed)
Important Message  Patient Details  Name: Jordan Le MRN: [id number] Date of Birth: [birth year]   Medicare Important Message Given:  Yes     [name] 10/30/2018, 1:08 PM

## 2018-10-30 NOTE — Progress Notes (Signed)
PROGRESS NOTE  Jordan Le YBO:[id number] DOB: November 10, 1945 DOA: 10/25/2018 PCP: Manon Hilding, MD  Brief History:  73 year old female with a history of diabetes mellitus, hypertension, liver cirrhosis, endometrial carcinoma status post hysterectomy February 2020 presented with 69-month history of shortness of breath and dyspnea on exertion as well as decreased oral intake.  She has also complained of increasing peripheral edema and increasing abdominal girth she states that she usually sleeps in a recliner, but is unable to tell me whether she truly has orthopnea or not because she states that she has never tried to lay flat in her life.  She denied any fevers, chills, headache, chest pain, coughing, hemoptysis, nausea, vomiting, diarrhea, abdominal pain.  Notably, the patient states that she was started on furosemide 20 mg daily in early April 2020.  Her dose of furosemide was subsequently increased to 40 mg daily in mid May 2020.  Approximately 3 weeks prior to this admission, spironolactone was added.  She denies any outpatient NSAID use.  She continues on losartan.  The patient states that she was told about 1 month prior to this admission that she had a problem with her kidneys, but this was not investigated further.  In the ED, the patient was afebrile hemodynamically stable saturating 94-96% room air.  Serum creatinine was 3.94.  WBC 9.5, hemoglobin 11.3, platelets 213,000.  CT of the abdomen pelvis showed a large left pleural effusion with left basilar atelectasis.  There was a shrunken nodular liver consistent with liver cirrhosis.  There was no bowel wall thickening.  There was significant ascites.  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 -10/26/18--paracentesis--2L removed -Hepatitis B surface antigen--neg -Hepatitis C antibody--neg -Antimitochondrial antibody negative -Anti-smooth muscle antibody negative -Alpha-1  antitrypsin level--normal -Alpha-fetoprotein pending -ANA negative -Initially furosemide and spironolactone were discontinued due to  AKI -10/26/18 paracentesis--3 L removed--> albumin given after paracentesis  -Continue ceftriaxone for SBP prophylaxis -Still very volume overloaded, per nephrologist okay to increase IV Lasix 220 mg twice a day   Acute kidney injury -Likely due to hemodynamic changes in the setting of furosemide, spironolactone, losartan -Urine sodium and creatinine <1 -Renal ultrasound negative for hydronephrosis -Nephrology consultation appreciated -urine protein/creatinine ratio--0.25 -Creatinine improved from a peak of  3.96 down to 2.47  Left pleural effusion --per Dr. Carmelina Dane overall small--defer Inocencio Homes for now -reassess for thora if respiratory status worsens again -echo shows normal EF -suspect hepatothorax  SVT/Atrial tachycardia -personally reviewed EKG--SVT, nonspecific T wave change -start po diltiazem -Echo shows normal EF -TSH 5.4  LLE Stasis Dermatitis -doubt cellulitis -present x 3 months -already on ceftriaxone without change -Should hopefully improve that she diuresis  Diabetes mellitus type 2 -8/3/20Hemoglobin A1c--4.7 -NovoLog sliding scale -Discontinued metformin  Hypothyroidism -Continue Synthroid  Endometrial carcinoma -Status post hysterectomy 05/07/2018 -follow up Dr. Denman George  Hyperlipidemia -continue statin   Disposition Plan:   Needs iv Lasix and iv Albulmin Family Communication:   Granddaughter updated peviously Consultants:  GI, renal  Code Status:  FULL   DVT Prophylaxis:   [place] Lovenox   Procedures: As Listed in Progress Note Above  Antibiotics: Ceftriaxone 8/2>>>8/3   Subjective:  No chest pain, oral intake is fair, continues to have dyspnea on exertion  Objective: Vitals:   10/30/18 0500 10/30/18 0505 10/30/18 1324 10/30/18 1600  BP:  (!) 109/47 93/60   Pulse:  82    Resp:  20      Temp:  98.7 F (37.1 C)    TempSrc:  Oral    SpO2:  96%  98%  Weight: 101.9 kg     Height:        Intake/Output Summary (Last 24 hours) at 10/30/2018 1802 Last data filed at 10/30/2018 1200 Gross per 24 hour  Intake 814.03 ml  Output 450 ml  Net 364.03 ml   Weight change: 1.7 kg Exam:  General exam: Alert, awake, oriented x 3 Respiratory system: Crackles at bases.  Dyspnea on exertion cardiovascular system:RRR. No murmurs, rubs, gallops. Gastrointestinal system: Abdomen is distended, soft and nontender.   Normal bowel sounds heard. Central nervous system: Alert and oriented. No focal neurological deficits. Extremities: 2+ edema lower extremities bilaterally Skin: Venous stasis changes in lower extremities bilaterally  Psychiatry: Judgement and insight appear normal. Mood & affect appropriate.   Data Reviewed:   Basic Metabolic Panel: Recent Labs  Lab 10/25/18 1726 10/26/18 0543 10/27/18 0272 10/28/18 0646 10/29/18 0510 10/30/18 0525  NA 134* 135 138 139 138   138 138  K 3.9 4.4 4.6 4.4 4.2   4.1 4.3  CL 100 104 106 106 108   107 107  CO2 22 22 23 24 24   23 25   GLUCOSE 124* 86 84 95 106*   107* 105*  BUN 70* 71* 69* 66* 62*   61* 58*  CREATININE 3.94* 3.96* 3.27* 2.84* 2.58*   2.58* 2.47*  CALCIUM 8.2* 7.9* 8.1* 8.2* 8.2*   8.2* 8.1*  MG 2.4  --   --   --   --   --   PHOS  --   --   --  3.9 3.3 3.3   Liver Function Tests: Recent Labs  Lab 10/25/18 1726 10/27/18 0613 10/28/18 0646 10/29/18 0510 10/30/18 0525  AST 29 16 21 18   --   ALT 14 11 9 10   --   ALKPHOS 53 29* 48 28*  --   BILITOT 3.1* 2.5* 2.3* 2.3*  --   PROT 6.5 5.2* 5.1* 5.3*  --   ALBUMIN 2.5* 2.5* 2.4*   2.4* 3.0*   3.1* 2.8*   No results for input(s): LIPASE, AMYLASE in the last 168 hours. No results for input(s): AMMONIA in the last 168 hours. Coagulation Profile: Recent Labs  Lab 10/26/18 0543 10/27/18 0613 10/29/18 0917  INR 2.1* 2.2* 2.0*   CBC: Recent Labs  Lab 10/25/18 1726  10/27/18 0613 10/29/18 0510  WBC 9.5 7.1 7.1  NEUTROABS 5.3  --   --   HGB 11.3* 9.7* 9.2*  HCT 34.2* 29.6* 28.2*  MCV 99.1 98.0 98.3  PLT 213 155 129*   Cardiac Enzymes: No results for input(s): CKTOTAL, CKMB, CKMBINDEX, TROPONINI in the last 168 hours. BNP: Invalid input(s): POCBNP CBG: Recent Labs  Lab 10/29/18 1609 10/29/18 2201 10/30/18 0949 10/30/18 1130 10/30/18 1654  GLUCAP 140* 127* 170* 147* 116*   HbA1C: No results for input(s): HGBA1C in the last 72 hours. Urine analysis:    Component Value Date/Time   COLORURINE AMBER (A) 10/25/2018 2100   APPEARANCEUR CLOUDY (A) 10/25/2018 2100   LABSPEC 1.013 10/25/2018 2100   PHURINE 5.0 10/25/2018 2100   GLUCOSEU NEGATIVE 10/25/2018 2100   HGBUR SMALL (A) 10/25/2018 2100   [place] NEGATIVE 10/25/2018 2100   [place] NEGATIVE 10/25/2018 2100   PROTEINUR NEGATIVE 10/25/2018 2100   NITRITE NEGATIVE 10/25/2018 2100   LEUKOCYTESUR SMALL (A) 10/25/2018 2100   Sepsis Labs: @LABRCNTIP (procalcitonin:4,lacticidven:4) ) Recent Results (from the past 240 hour(s))  SARS CORONAVIRUS 2 Nasal Swab Aptima Multi Swab     Status: None   Collection Time: 10/25/18  5:23 PM   Specimen: Aptima Multi Swab; Nasal Swab  Result Value Ref Range Status   SARS Coronavirus 2 NEGATIVE NEGATIVE Final    Comment: (NOTE) SARS-CoV-2 target nucleic acids are NOT DETECTED. The SARS-CoV-2 RNA is generally detectable in upper and lower respiratory specimens during the acute phase of infection. Negative results do not preclude SARS-CoV-2 infection, do not rule out co-infections with other pathogens, and should not be used as the sole basis for treatment or other patient management decisions. Negative results must be combined with clinical observations, patient history, and epidemiological information. The expected result is Negative. Fact Sheet for Patients: SugarRoll.be Fact Sheet for Healthcare  Providers: https://www.woods-mathews.com/ This test is not yet approved or cleared by the [place] FDA and  has been authorized for detection and/or diagnosis of SARS-CoV-2 by FDA under an Emergency Use Authorization (EUA). This EUA will remain  in effect (meaning this test can be used) for the duration of the COVID-19 declaration under Section 56 4(b)(1) of the Act, 21 U.S.C. section 360bbb-3(b)(1), unless the authorization is terminated or revoked sooner. Performed at [facility], [place] [address], [place], [place] [address]   Urine culture     Status: None   Collection Time: 10/25/18  9:45 PM   Specimen: Urine, Catheterized  Result Value Ref Range Status   Specimen Description   Final    URINE, CATHETERIZED Performed at [facility], [address] [place], [place] [address]    Special Requests   Final    NONE Performed at [facility], [address], [place], [place] [address]    Culture   Final    NO GROWTH Performed at [facility], [place] [address] [place], [place] [address]    Report Status 10/27/2018 FINAL  Final  MRSA PCR Screening     Status: None   Collection Time: 10/25/18 11:17 PM   Specimen: Nasal Mucosa; Nasopharyngeal  Result Value Ref Range Status   MRSA by PCR NEGATIVE NEGATIVE Final    Comment:        The GeneXpert MRSA Assay (FDA approved for NASAL specimens only), is one component of a comprehensive MRSA colonization surveillance program. It is not intended to diagnose MRSA infection nor to guide or monitor treatment for MRSA infections. Performed at [facility], [address], [place], [place] [address]   Culture, body fluid-bottle     Status: None (Preliminary result)   Collection Time: 10/26/18 10:26 AM   Specimen: Ascitic  Result Value Ref Range Status   Specimen Description ASCITIC  Final   Special Requests BOTTLES DRAWN AEROBIC AND ANAEROBIC 10 CC EACH  Final   Culture   Final    NO GROWTH 4 DAYS Performed  at [facility], [address] [place], [place] [address]    Report Status PENDING  Incomplete  Gram stain     Status: None   Collection Time: 10/26/18 10:26 AM   Specimen: Ascitic  Result Value Ref Range Status   Specimen Description ASCITIC  Final   Special Requests NONE  Final   Gram Stain   Final    NO ORGANISMS SEEN WBC PRESENT, PREDOMINANTLY MONONUCLEAR CYTOSPIN SMEAR Performed at [facility], [address] [place],  [address]    Report Status 10/26/2018 FINAL  Final     Scheduled Meds:  aspirin EC  81 mg Oral  QHS   atorvastatin  10 mg Oral q1800   Chlorhexidine Gluconate Cloth  6 each Topical Q0600   diltiazem  30 mg Oral Q6H   enoxaparin (LOVENOX) injection  30 mg Subcutaneous Q24H   insulin aspart  0-15 Units Subcutaneous TID WC   insulin aspart  0-5 Units Subcutaneous QHS   levothyroxine  75 mcg Oral QAC breakfast   nystatin   Topical TID   Continuous Infusions:  cefTRIAXone (ROCEPHIN)  IV Stopped (10/29/18 2233)   furosemide      Procedures/Studies: Ct Abdomen Pelvis Wo Contrast  Result Date: 10/25/2018 CLINICAL DATA:  Acute renal injury, history of prior hysterectomy EXAM: CT ABDOMEN AND PELVIS WITHOUT CONTRAST TECHNIQUE: Multidetector CT imaging of the abdomen and pelvis was performed following the standard protocol without IV contrast. COMPARISON:  04/22/2018 FINDINGS: Lower chest: Large left pleural effusion is noted with underlying left basilar atelectasis. The right lung base is clear. Hepatobiliary: Shrunken liver with nodular contour consistent with underlying cirrhosis. No focal mass is noted. The gallbladder is well distended with dependent gallstones similar to that noted on the prior exam. Pancreas: Unremarkable. No pancreatic ductal dilatation or surrounding inflammatory changes. Spleen: Normal in size without focal abnormality. Adrenals/Urinary Tract: Adrenal glands are within normal limits. Small exophytic cyst is noted arising from  the right kidney similar to that seen on prior exam. No renal calculi or obstructive changes are seen. The bladder is decompressed by Foley catheter. Stomach/Bowel: Appendix is not well visualized although no inflammatory changes are seen. No obstructive or inflammatory changes of large or small bowel are seen. The stomach is within normal limits. Vascular/Lymphatic: Aortic atherosclerosis. No enlarged abdominal or pelvic lymph nodes. Reproductive: Uterus is been surgically removed consistent with the given clinical history. Other: Significant ascites is noted new from the prior exam. This may be related to the underlying portal hypertension. Musculoskeletal: No acute or significant osseous findings. IMPRESSION: Changes of hepatic cirrhosis and significant ascites which is new from the prior exam. New moderate left-sided pleural effusion. Electronically Signed   By: Inez Catalina M.D.   On: 10/25/2018 21:34   Dg Chest 2 View  Result Date: 10/25/2018 CLINICAL DATA:  Shortness of breath EXAM: CHEST - 2 VIEW COMPARISON:  Chest CT April 22, 2018 FINDINGS: There is airspace consolidation in the left lower lobe with small left pleural effusion. The lungs elsewhere are clear. Heart size and pulmonary vascularity are normal. No adenopathy. No bone lesions. IMPRESSION: Left lower lobe airspace consolidation, felt to represent a degree of pneumonia. Small left pleural effusion. Lungs elsewhere clear. Cardiac silhouette normal. No adenopathy. Followup PA and lateral chest radiographs recommended in 3-4 weeks following trial of antibiotic therapy to ensure resolution and exclude underlying malignancy. Electronically Signed   By: Lowella Grip III M.D.   On: 10/25/2018 17:31   US Renal  Result Date: 10/26/2018 CLINICAL DATA:  Acute kidney injury EXAM: RENAL / URINARY TRACT ULTRASOUND COMPLETE COMPARISON:  CT abdomen pelvis October 25, 2018 FINDINGS: Right Kidney: Renal measurements: 11.3 x 4.9 x 7.1 cm. = volume: 206  mL. Echogenicity is normal. There is a small anechoic cyst seen within the midpole measuring 0.9 x 1.0 x 0.9 cm. Left Kidney: Renal measurements: 11.8 x 5.2 x 5.8 cm = volume: 187 mL. Echogenicity within normal limits. No mass or hydronephrosis visualized. Bladder: Decompressed due to Foley catheter in place. Abdominopelvic ascites is seen. IMPRESSION: Small right midpole renal cyst.  Otherwise normal appearing kidneys. Electronically Signed   By: Kerby Moors  Avutu M.D.   On: 10/26/2018 11:49   US Paracentesis  Result Date: 10/26/2018 INDICATION: Cirrhosis, ascites EXAM: ULTRASOUND GUIDED DIAGNOSTIC AND THERAPEUTIC PARACENTESIS MEDICATIONS: None. COMPLICATIONS: None immediate. PROCEDURE: Procedure, benefits, and risks of procedure were discussed with patient. Written informed consent for procedure was obtained. Time out protocol followed. Adequate collection of ascites localized by ultrasound in RIGHT lower quadrant. Skin prepped and draped in usual sterile fashion. Skin and soft tissues anesthetized with 10 mL of 1% lidocaine. 5 [place] Yueh catheter placed into peritoneal cavity. 2 L of amber colored ascitic fluid aspirated by vacuum bottle suction. Volume removed limited to 2 L at request of ordering physician. Procedure tolerated well by patient without immediate complication. FINDINGS: A total of approximately 2 L of ascitic fluid was removed. Samples were sent to the laboratory as requested by the clinical team. IMPRESSION: Successful ultrasound-guided paracentesis yielding 2 liters of peritoneal fluid. Electronically Signed   By: Lavonia Dana M.D.   On: 10/26/2018 11:29    Roxan Hockey, MD  [facility]   If 7PM-7AM, please contact night-coverage [email or web address]  10/30/2018, 6:02 PM   LOS: 5 days

## 2018-10-30 NOTE — Progress Notes (Signed)
Patient ID: Jordan Le, female   DOB: 1945/12/20, 73 y.o.   MRN: [id number] S: Feels better today O:BP (!) 109/47 (BP Location: Right Arm)   Pulse 82   Temp 98.7 F (37.1 C) (Oral)   Resp 20   Ht 5\' 3"  (1.6 m)   Wt 101.9 kg   SpO2 96%   BMI 39.79 kg/m   Intake/Output Summary (Last 24 hours) at 10/30/2018 1047 Last data filed at 10/30/2018 0600 Gross per 24 hour  Intake 574.03 ml  Output 600 ml  Net -25.97 ml   Intake/Output: I/O last 3 completed shifts: In: 7829 [P.O.:720; IV Piggyback:294] Out: 1000 [Urine:1000]  Intake/Output this shift:  No intake/output data recorded. Weight change: 1.7 kg Gen: NAD, sitting in chair  CVS: no rub Resp: decreased BS at bases Abd: distended, +BS, soft Ext:3+ anasarca  Recent Labs  Lab 10/25/18 1726 10/26/18 0543 10/27/18 0613 10/28/18 0646 10/29/18 0510 10/30/18 0525  NA 134* 135 138 139 138  138 138  K 3.9 4.4 4.6 4.4 4.2  4.1 4.3  CL 100 104 106 106 108  107 107  CO2 22 22 23 24 24  23 25   GLUCOSE 124* 86 84 95 106*  107* 105*  BUN 70* 71* 69* 66* 62*  61* 58*  CREATININE 3.94* 3.96* 3.27* 2.84* 2.58*  2.58* 2.47*  ALBUMIN 2.5*  --  2.5* 2.4*  2.4* 3.0*  3.1* 2.8*  CALCIUM 8.2* 7.9* 8.1* 8.2* 8.2*  8.2* 8.1*  PHOS  --   --   --  3.9 3.3 3.3  AST 29  --  16 21 18   --   ALT 14  --  11 9 10   --    Liver Function Tests: Recent Labs  Lab 10/27/18 0613 10/28/18 0646 10/29/18 0510 10/30/18 0525  AST 16 21 18   --   ALT 11 9 10   --   ALKPHOS 29* 48 28*  --   BILITOT 2.5* 2.3* 2.3*  --   PROT 5.2* 5.1* 5.3*  --   ALBUMIN 2.5* 2.4*  2.4* 3.0*  3.1* 2.8*   No results for input(s): LIPASE, AMYLASE in the last 168 hours. No results for input(s): AMMONIA in the last 168 hours. CBC: Recent Labs  Lab 10/25/18 1726 10/27/18 0613 10/29/18 0510  WBC 9.5 7.1 7.1  NEUTROABS 5.3  --   --   HGB 11.3* 9.7* 9.2*  HCT 34.2* 29.6* 28.2*  MCV 99.1 98.0 98.3  PLT 213 155 129*   Cardiac Enzymes: No results for  input(s): CKTOTAL, CKMB, CKMBINDEX, TROPONINI in the last 168 hours. CBG: Recent Labs  Lab 10/29/18 0719 10/29/18 1136 10/29/18 1609 10/29/18 2201 10/30/18 0949  GLUCAP 97 152* 140* 127* 170*    Iron Studies: No results for input(s): IRON, TIBC, TRANSFERRIN, FERRITIN in the last 72 hours. Studies/Results: No results found. Marland Kitchen aspirin EC  81 mg Oral QHS  . atorvastatin  10 mg Oral q1800  . Chlorhexidine Gluconate Cloth  6 each Topical Q0600  . diltiazem  30 mg Oral Q6H  . enoxaparin (LOVENOX) injection  30 mg Subcutaneous Q24H  . insulin aspart  0-15 Units Subcutaneous TID WC  . insulin aspart  0-5 Units Subcutaneous QHS  . levothyroxine  75 mcg Oral QAC breakfast  . nystatin   Topical TID    BMET    Component Value Date/Time   NA 138 10/30/2018 0525   K 4.3 10/30/2018 0525   CL 107 10/30/2018 0525  CO2 25 10/30/2018 0525   GLUCOSE 105 (H) 10/30/2018 0525   BUN 58 (H) 10/30/2018 0525   CREATININE 2.47 (H) 10/30/2018 0525   CALCIUM 8.1 (L) 10/30/2018 0525   GFRNONAA 19 (L) 10/30/2018 0525   GFRAA 22 (L) 10/30/2018 0525   CBC    Component Value Date/Time   WBC 7.1 10/29/2018 0510   RBC 2.87 (L) 10/29/2018 0510   HGB 9.2 (L) 10/29/2018 0510   HCT 28.2 (L) 10/29/2018 0510   PLT 129 (L) 10/29/2018 0510   MCV 98.3 10/29/2018 0510   MCH 32.1 10/29/2018 0510   MCHC 32.6 10/29/2018 0510   RDW 14.9 10/29/2018 0510   LYMPHSABS 1.8 10/25/2018 1726   MONOABS 1.8 (H) 10/25/2018 1726   EOSABS 0.4 10/25/2018 1726   BASOSABS 0.1 10/25/2018 1726   Assessment/Plan:  1. AKI- in setting of decompensated cirrhosis with anasarca/ascites and intravascular volume depletion in setting of concomitant ARB therapy. Since stopping the ARB and diuretics, her Cr has continued to improve from a peak 3.96 down to 2.47   1. Avoid NSAIDs/ACE/ARB or nephrotoxic agents such as IV contrast 2. Limit large volume paracenteses to 2 liters and administer albumin 3. continue to follow UOP and Scr   4. Sodium restriction 2 grams/day 5. Fluid restriction to 1.5 liters/day 2. Cirrhosis- new diagnosis presumably due to hepatic steatosis. Workup underway per GI. Hepatitis panel negative. 3. Anasarca- restarted IV furosemide 80 mg 10/29/18 (last dose 10/25/18) without significant diuresis. 1. Goal UF of 1-2 liters/day to help limit further ischemic injury related to hemodynamic shifts.  2. Increase IV lasix to 120 mg bid as she is not adequately responding with 80 mg 3. May need to increase dose and/or frequency depending upon her response 4. Hyponatremia- hypervolemic. Improved 5. Anemia- normal iron stores. Unclear if she has had variceal bleeding. GI following.  Donetta Potts, MD [employer] [phone]

## 2018-10-31 DIAGNOSIS — N390 Urinary tract infection, site not specified: Secondary | ICD-10-CM

## 2018-10-31 LAB — COMPREHENSIVE METABOLIC PANEL
ALT: 10 U/L (ref 0–44)
AST: 19 U/L (ref 15–41)
Albumin: 2.6 g/dL — ABNORMAL LOW (ref 3.5–5.0)
Alkaline Phosphatase: 31 U/L — ABNORMAL LOW (ref 38–126)
Anion gap: 9 (ref 5–15)
BUN: 56 mg/dL — ABNORMAL HIGH (ref 8–23)
CO2: 25 mmol/L (ref 22–32)
Calcium: 8.3 mg/dL — ABNORMAL LOW (ref 8.9–10.3)
Chloride: 106 mmol/L (ref 98–111)
Creatinine, Ser: 2.24 mg/dL — ABNORMAL HIGH (ref 0.44–1.00)
GFR calc Af Amer: 24 mL/min — ABNORMAL LOW (ref >60)
GFR calc non Af Amer: 21 mL/min — ABNORMAL LOW (ref >60)
Glucose, Bld: 92 mg/dL (ref 70–99)
Potassium: 3.9 mmol/L (ref 3.5–5.1)
Sodium: 140 mmol/L (ref 135–145)
Total Bilirubin: 1.9 mg/dL — ABNORMAL HIGH (ref 0.3–1.2)
Total Protein: 5.1 g/dL — ABNORMAL LOW (ref 6.5–8.1)

## 2018-10-31 LAB — GLUCOSE, CAPILLARY
Glucose-Capillary: 83 mg/dL (ref 70–99)
Glucose-Capillary: 143 mg/dL — ABNORMAL HIGH (ref 70–99)
Glucose-Capillary: 113 mg/dL — ABNORMAL HIGH (ref 70–99)
Glucose-Capillary: 120 mg/dL — ABNORMAL HIGH (ref 70–99)

## 2018-10-31 LAB — RENAL FUNCTION PANEL
Albumin: 2.6 g/dL — ABNORMAL LOW (ref 3.5–5.0)
Anion gap: 8 (ref 5–15)
BUN: 56 mg/dL — ABNORMAL HIGH (ref 8–23)
CO2: 26 mmol/L (ref 22–32)
Calcium: 8.3 mg/dL — ABNORMAL LOW (ref 8.9–10.3)
Chloride: 106 mmol/L (ref 98–111)
Creatinine, Ser: 2.27 mg/dL — ABNORMAL HIGH (ref 0.44–1.00)
GFR calc Af Amer: 24 mL/min — ABNORMAL LOW (ref >60)
GFR calc non Af Amer: 21 mL/min — ABNORMAL LOW (ref >60)
Glucose, Bld: 90 mg/dL (ref 70–99)
Phosphorus: 3.1 mg/dL (ref 2.5–4.6)
Potassium: 3.9 mmol/L (ref 3.5–5.1)
Sodium: 140 mmol/L (ref 135–145)

## 2018-10-31 LAB — CULTURE, BODY FLUID W GRAM STAIN -BOTTLE: Culture: NO GROWTH

## 2018-10-31 LAB — CBC
HCT: 27.4 % — ABNORMAL LOW (ref 36.0–46.0)
Hemoglobin: 8.8 g/dL — ABNORMAL LOW (ref 12.0–15.0)
MCH: 32.2 pg (ref 26.0–34.0)
MCHC: 32.1 g/dL (ref 30.0–36.0)
MCV: 100.4 fL — ABNORMAL HIGH (ref 80.0–100.0)
Platelets: 118 10*3/uL — ABNORMAL LOW (ref 150–400)
RBC: 2.73 MIL/uL — ABNORMAL LOW (ref 3.87–5.11)
RDW: 15 % (ref 11.5–15.5)
WBC: 6.8 10*3/uL (ref 4.0–10.5)
nRBC: 0 % (ref 0.0–0.2)

## 2018-10-31 MED ORDER — OXYCODONE HCL 5 MG PO TABS
5.0000 mg | ORAL_TABLET | ORAL | Status: DC | PRN
  Administered 2018-10-31: 5 mg via ORAL
  Filled 2018-10-31: qty 1

## 2018-10-31 MED ORDER — FUROSEMIDE 10 MG/ML IJ SOLN
160.0000 mg | Freq: Three times a day (TID) | INTRAVENOUS | Status: DC
  Administered 2018-10-31 – 2018-11-01 (×3): 160 mg via INTRAVENOUS
  Filled 2018-10-31 (×7): qty 16

## 2018-10-31 MED ORDER — ALBUMIN HUMAN 25 % IV SOLN
12.5000 g | Freq: Every day | INTRAVENOUS | Status: DC
  Administered 2018-10-31 – 2018-11-05 (×6): 12.5 g via INTRAVENOUS
  Filled 2018-10-31 (×7): qty 50

## 2018-10-31 NOTE — Progress Notes (Signed)
Patient ID: Jordan Le, female   DOB: November 30, 1945, 73 y.o.   MRN: [id number] S: 1200 UOP recorded, minus 560 - BUN and crt trending better still - hgb dropping.  She feels the same    O:BP (!) 108/55 (BP Location: Right Arm)   Pulse (!) 129   Temp 98.5 F (36.9 C) (Oral)   Resp 18   Ht 5\' 3"  (1.6 m)   Wt 99.3 kg   SpO2 96%   BMI 38.78 kg/m   Intake/Output Summary (Last 24 hours) at 10/31/2018 1316 Last data filed at 10/31/2018 0400 Gross per 24 hour  Intake 158.31 ml  Output 850 ml  Net -691.69 ml   Intake/Output: I/O last 3 completed shifts: In: 732.3 [P.O.:480; IV Piggyback:252.3] Out: 1450 [Urine:1450]  Intake/Output this shift:  No intake/output data recorded. Weight change: -2.6 kg Gen: NAD, sitting in chair  CVS: no rub Resp: decreased BS at bases Abd: distended, +BS, soft Ext:3+ anasarca  Recent Labs  Lab 10/25/18 1726 10/26/18 0543 10/27/18 0613 10/28/18 0646 10/29/18 0510 10/30/18 0525 10/31/18 0551  NA 134* 135 138 139 138  138 138 140  140  K 3.9 4.4 4.6 4.4 4.2  4.1 4.3 3.9  3.9  CL 100 104 106 106 108  107 107 106  106  CO2 22 22 23 24 24  23 25 26  25   GLUCOSE 124* 86 84 95 106*  107* 105* 90  92  BUN 70* 71* 69* 66* 62*  61* 58* 56*  56*  CREATININE 3.94* 3.96* 3.27* 2.84* 2.58*  2.58* 2.47* 2.27*  2.24*  ALBUMIN 2.5*  --  2.5* 2.4*  2.4* 3.0*  3.1* 2.8* 2.6*  2.6*  CALCIUM 8.2* 7.9* 8.1* 8.2* 8.2*  8.2* 8.1* 8.3*  8.3*  PHOS  --   --   --  3.9 3.3 3.3 3.1  AST 29  --  16 21 18   --  19  ALT 14  --  11 9 10   --  10   Liver Function Tests: Recent Labs  Lab 10/28/18 0646 10/29/18 0510 10/30/18 0525 10/31/18 0551  AST 21 18  --  19  ALT 9 10  --  10  ALKPHOS 48 28*  --  31*  BILITOT 2.3* 2.3*  --  1.9*  PROT 5.1* 5.3*  --  5.1*  ALBUMIN 2.4*  2.4* 3.0*  3.1* 2.8* 2.6*  2.6*   No results for input(s): LIPASE, AMYLASE in the last 168 hours. No results for input(s): AMMONIA in the last 168 hours. CBC: Recent Labs   Lab 10/25/18 1726 10/27/18 0613 10/29/18 0510 10/31/18 0551  WBC 9.5 7.1 7.1 6.8  NEUTROABS 5.3  --   --   --   HGB 11.3* 9.7* 9.2* 8.8*  HCT 34.2* 29.6* 28.2* 27.4*  MCV 99.1 98.0 98.3 100.4*  PLT 213 155 129* 118*   Cardiac Enzymes: No results for input(s): CKTOTAL, CKMB, CKMBINDEX, TROPONINI in the last 168 hours. CBG: Recent Labs  Lab 10/30/18 1130 10/30/18 1654 10/30/18 2206 10/31/18 0755 10/31/18 1128  GLUCAP 147* 116* 118* 83 143*    Iron Studies: No results for input(s): IRON, TIBC, TRANSFERRIN, FERRITIN in the last 72 hours. Studies/Results: No results found. Marland Kitchen aspirin EC  81 mg Oral QHS  . atorvastatin  10 mg Oral q1800  . Chlorhexidine Gluconate Cloth  6 each Topical Q0600  . diltiazem  30 mg Oral Q6H  . enoxaparin (LOVENOX) injection  30 mg Subcutaneous Q24H  .  insulin aspart  0-15 Units Subcutaneous TID WC  . insulin aspart  0-5 Units Subcutaneous QHS  . levothyroxine  75 mcg Oral QAC breakfast  . nystatin   Topical TID    BMET    Component Value Date/Time   NA 140 10/31/2018 0551   NA 140 10/31/2018 0551   K 3.9 10/31/2018 0551   K 3.9 10/31/2018 0551   CL 106 10/31/2018 0551   CL 106 10/31/2018 0551   CO2 26 10/31/2018 0551   CO2 25 10/31/2018 0551   GLUCOSE 90 10/31/2018 0551   GLUCOSE 92 10/31/2018 0551   BUN 56 (H) 10/31/2018 0551   BUN 56 (H) 10/31/2018 0551   CREATININE 2.27 (H) 10/31/2018 0551   CREATININE 2.24 (H) 10/31/2018 0551   CALCIUM 8.3 (L) 10/31/2018 0551   CALCIUM 8.3 (L) 10/31/2018 0551   GFRNONAA 21 (L) 10/31/2018 0551   GFRNONAA 21 (L) 10/31/2018 0551   GFRAA 24 (L) 10/31/2018 0551   GFRAA 24 (L) 10/31/2018 0551   CBC    Component Value Date/Time   WBC 6.8 10/31/2018 0551   RBC 2.73 (L) 10/31/2018 0551   HGB 8.8 (L) 10/31/2018 0551   HCT 27.4 (L) 10/31/2018 0551   PLT 118 (L) 10/31/2018 0551   MCV 100.4 (H) 10/31/2018 0551   MCH 32.2 10/31/2018 0551   MCHC 32.1 10/31/2018 0551   RDW 15.0 10/31/2018 0551    LYMPHSABS 1.8 10/25/2018 1726   MONOABS 1.8 (H) 10/25/2018 1726   EOSABS 0.4 10/25/2018 1726   BASOSABS 0.1 10/25/2018 1726   Assessment/Plan:  1. AKI- in setting of decompensated cirrhosis with anasarca/ascites and intravascular volume depletion in setting of concomitant ARB therapy. Since stopping the ARB and diuretics, her Cr has continued to improve from a peak 3.96 down to 2.27   1. Avoid NSAIDs/ACE/ARB or nephrotoxic agents such as IV contrast 2. Limit large volume paracenteses to 2 liters and administer albumin 3. continue to follow UOP and Scr  4. Sodium restriction 2 grams/day 5. Fluid restriction to 1.5 liters/day 2. Cirrhosis- new diagnosis presumably due to hepatic steatosis. Workup underway per GI. Hepatitis panel negative. 3. Anasarca- restarted IV furosemide 80 mg 10/29/18 (last dose 10/25/18) without significant diuresis. 1. Goal UF of 1-2 liters/day to help limit further ischemic injury related to hemodynamic shifts.  2. Increase IV lasix to 160 mg tid as she is not adequately responding with 120 mg bid-  will try to add on some albumin as well   4. Hyponatremia- hypervolemic. Improved 5. Anemia- normal iron stores. Unclear if she has had variceal bleeding. GI following. hgb dropping, not helping our oncotic situation    [place] [phone]

## 2018-10-31 NOTE — Progress Notes (Signed)
PROGRESS NOTE  Jordan Le UMP:[id number] DOB: 11-Jun-1945 DOA: 10/25/2018 PCP: Manon Hilding, MD  Brief History:  73 year old female with a history of diabetes mellitus, hypertension, liver cirrhosis, endometrial carcinoma status post hysterectomy February 2020 presented with 22-month history of shortness of breath and dyspnea on exertion as well as decreased oral intake.  She has also complained of increasing peripheral edema and increasing abdominal girth she states that she usually sleeps in a recliner, but is unable to tell me whether she truly has orthopnea or not because she states that she has never tried to lay flat in her life.  She denied any fevers, chills, headache, chest pain, coughing, hemoptysis, nausea, vomiting, diarrhea, abdominal pain.  Notably, the patient states that she was started on furosemide 20 mg daily in early April 2020.  Her dose of furosemide was subsequently increased to 40 mg daily in mid May 2020.  Approximately 3 weeks prior to this admission, spironolactone was added.  She denies any outpatient NSAID use.  She continues on losartan.  The patient states that she was told about 1 month prior to this admission that she had a problem with her kidneys, but this was not investigated further.  In the ED, the patient was afebrile hemodynamically stable saturating 94-96% room air.  Serum creatinine was 3.94.  WBC 9.5, hemoglobin 11.3, platelets 213,000.  CT of the abdomen pelvis showed a large left pleural effusion with left basilar atelectasis.  There was a shrunken nodular liver consistent with liver cirrhosis.  There was no bowel wall thickening.  There was significant ascites.  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 -10/26/18--paracentesis--2L removed -Hepatitis B surface antigen--neg -Hepatitis C antibody--neg -Antimitochondrial antibody negative -Anti-smooth muscle antibody negative -Alpha-1  antitrypsin level--normal -Alpha-fetoprotein pending -ANA negative -Initially furosemide and spironolactone were discontinued due to  AKI -10/26/18 paracentesis--3 L removed--> albumin given after paracentesis  -Continue ceftriaxone for SBP prophylaxis -Still very volume overloaded, per nephrologist  increase IV Lasix  To 160 mg tid from 120 mg twice a day    Acute Kidney Injury -Likely due to hemodynamic changes in the setting of furosemide, spironolactone, losartan use -Urine sodium and creatinine <1 -Renal ultrasound negative for hydronephrosis -Nephrology consultation appreciated -urine protein/creatinine ratio--0.25 -Creatinine improved from a peak of  3.96 down to 2.27  Left pleural effusion --per Dr. Carmelina Dane overall small--defer Inocencio Homes for now -reassess for thora if respiratory status worsens again -echo shows normal EF -suspect hepatothorax  SVT/Atrial tachycardia -personally reviewed EKG--SVT, nonspecific T wave change -start po diltiazem -Echo shows normal EF -TSH 5.4  LLE Stasis Dermatitis -doubt cellulitis -present x 3 months -already on ceftriaxone without change -Should hopefully improve that she diuresis -Ace wraps to bilateral lower extremities in graduated pressure fashion from toes to just below the knee ---  -please rewrapped daily  Diabetes mellitus type 2 -8/3/20Hemoglobin A1c--4.7 -NovoLog sliding scale -Discontinued metformin  Hypothyroidism -Continue Synthroid  Endometrial Carcinoma -Status post hysterectomy 05/07/2018 -follow up Dr. Denman George  Hyperlipidemia -continue statin   Disposition Plan:   Needs iv Lasix and iv Albulmin Family Communication:   Granddaughter updated peviously Consultants:  GI, renal  Code Status:  FULL   DVT Prophylaxis:   [place] Lovenox  Procedures: As Listed in Progress Note Above  Antibiotics: Ceftriaxone 8/2>>>8/3   Subjective:  Not voiding as much --No shortness of breath at rest, some  dyspnea on exertion  Objective: Vitals:   10/31/18 0327  10/31/18 0500 10/31/18 0600 10/31/18 1200  BP: (!) 96/42  (!) 93/52 (!) 108/55  Pulse: 86  81 (!) 129  Resp: 18     Temp: 98.5 F (36.9 C)     TempSrc: Oral     SpO2: 96%     Weight:  99.3 kg    Height:        Intake/Output Summary (Last 24 hours) at 10/31/2018 1554 Last data filed at 10/31/2018 0400 Gross per 24 hour  Intake 158.31 ml  Output 550 ml  Net -391.69 ml   Weight change: -2.6 kg Exam:  General exam: Alert, awake, oriented x 3 Respiratory system: Crackles at bases.  Dyspnea on exertion cardiovascular system:RRR. No murmurs, rubs, gallops. Gastrointestinal system: Abdomen is distended, soft and nontender.   Normal bowel sounds heard. Central nervous system: Alert and oriented. No focal neurological deficits. Extremities: 2+ edema lower extremities bilaterally Skin: Venous stasis changes in lower extremities bilaterally  Psychiatry: Judgement and insight appear normal. Mood & affect appropriate.   Data Reviewed:   Basic Metabolic Panel: Recent Labs  Lab 10/25/18 1726  10/27/18 0613 10/28/18 0646 10/29/18 0510 10/30/18 0525 10/31/18 0551  NA 134*   < > 138 139 138  138 138 140  140  K 3.9   < > 4.6 4.4 4.2  4.1 4.3 3.9  3.9  CL 100   < > 106 106 108  107 107 106  106  CO2 22   < > 23 24 24  23 25 26  25   GLUCOSE 124*   < > 84 95 106*  107* 105* 90  92  BUN 70*   < > 69* 66* 62*  61* 58* 56*  56*  CREATININE 3.94*   < > 3.27* 2.84* 2.58*  2.58* 2.47* 2.27*  2.24*  CALCIUM 8.2*   < > 8.1* 8.2* 8.2*  8.2* 8.1* 8.3*  8.3*  MG 2.4  --   --   --   --   --   --   PHOS  --   --   --  3.9 3.3 3.3 3.1   < > = values in this interval not displayed.   Liver Function Tests: Recent Labs  Lab 10/25/18 1726 10/27/18 0613 10/28/18 0646 10/29/18 0510 10/30/18 0525 10/31/18 0551  AST 29 16 21 18   --  19  ALT 14 11 9 10   --  10  ALKPHOS 53 29* 48 28*  --  31*  BILITOT 3.1* 2.5* 2.3* 2.3*  --   1.9*  PROT 6.5 5.2* 5.1* 5.3*  --  5.1*  ALBUMIN 2.5* 2.5* 2.4*  2.4* 3.0*  3.1* 2.8* 2.6*  2.6*   No results for input(s): LIPASE, AMYLASE in the last 168 hours. No results for input(s): AMMONIA in the last 168 hours. Coagulation Profile: Recent Labs  Lab 10/26/18 0543 10/27/18 0613 10/29/18 0917  INR 2.1* 2.2* 2.0*   CBC: Recent Labs  Lab 10/25/18 1726 10/27/18 0613 10/29/18 0510 10/31/18 0551  WBC 9.5 7.1 7.1 6.8  NEUTROABS 5.3  --   --   --   HGB 11.3* 9.7* 9.2* 8.8*  HCT 34.2* 29.6* 28.2* 27.4*  MCV 99.1 98.0 98.3 100.4*  PLT 213 155 129* 118*   Cardiac Enzymes: No results for input(s): CKTOTAL, CKMB, CKMBINDEX, TROPONINI in the last 168 hours. BNP: Invalid input(s): POCBNP CBG: Recent Labs  Lab 10/30/18 1130 10/30/18 1654 10/30/18 2206 10/31/18 0755 10/31/18 1128  GLUCAP 147* 116* 118* 83  143*   HbA1C: No results for input(s): HGBA1C in the last 72 hours. Urine analysis:    Component Value Date/Time   COLORURINE AMBER (A) 10/25/2018 2100   APPEARANCEUR CLOUDY (A) 10/25/2018 2100   LABSPEC 1.013 10/25/2018 2100   PHURINE 5.0 10/25/2018 2100   GLUCOSEU NEGATIVE 10/25/2018 2100   HGBUR SMALL (A) 10/25/2018 2100   [place] NEGATIVE 10/25/2018 2100   [place] NEGATIVE 10/25/2018 2100   PROTEINUR NEGATIVE 10/25/2018 2100   NITRITE NEGATIVE 10/25/2018 2100   LEUKOCYTESUR SMALL (A) 10/25/2018 2100   Sepsis Labs: @LABRCNTIP (procalcitonin:4,lacticidven:4) ) Recent Results (from the past 240 hour(s))  SARS CORONAVIRUS 2 Nasal Swab Aptima Multi Swab     Status: None   Collection Time: 10/25/18  5:23 PM   Specimen: Aptima Multi Swab; Nasal Swab  Result Value Ref Range Status   SARS Coronavirus 2 NEGATIVE NEGATIVE Final    Comment: (NOTE) SARS-CoV-2 target nucleic acids are NOT DETECTED. The SARS-CoV-2 RNA is generally detectable in upper and lower respiratory specimens during the acute phase of infection. Negative results do not preclude  SARS-CoV-2 infection, do not rule out co-infections with other pathogens, and should not be used as the sole basis for treatment or other patient management decisions. Negative results must be combined with clinical observations, patient history, and epidemiological information. The expected result is Negative. Fact Sheet for Patients: SugarRoll.be Fact Sheet for Healthcare Providers: https://www.woods-mathews.com/ This test is not yet approved or cleared by the [place] FDA and  has been authorized for detection and/or diagnosis of SARS-CoV-2 by FDA under an Emergency Use Authorization (EUA). This EUA will remain  in effect (meaning this test can be used) for the duration of the COVID-19 declaration under Section 56 4(b)(1) of the Act, 21 U.S.C. section 360bbb-3(b)(1), unless the authorization is terminated or revoked sooner. Performed at [facility], [place] [address], [place], [place] [address]   Urine culture     Status: None   Collection Time: 10/25/18  9:45 PM   Specimen: Urine, Catheterized  Result Value Ref Range Status   Specimen Description   Final    URINE, CATHETERIZED Performed at [facility], [address], [place], [place] [address]    Special Requests   Final    NONE Performed at [facility], [address], [place], [place] [address]    Culture   Final    NO GROWTH Performed at [facility], [place] [address] [place], [place] [address]    Report Status 10/27/2018 FINAL  Final  MRSA PCR Screening     Status: None   Collection Time: 10/25/18 11:17 PM   Specimen: Nasal Mucosa; Nasopharyngeal  Result Value Ref Range Status   MRSA by PCR NEGATIVE NEGATIVE Final    Comment:        The GeneXpert MRSA Assay (FDA approved for NASAL specimens only), is one component of a comprehensive MRSA colonization surveillance program. It is not intended to diagnose MRSA infection nor to guide or monitor treatment for  MRSA infections. Performed at [facility], [address], [place], [place] [address]   Culture, body fluid-bottle     Status: None   Collection Time: 10/26/18 10:26 AM   Specimen: Ascitic  Result Value Ref Range Status   Specimen Description ASCITIC  Final   Special Requests BOTTLES DRAWN AEROBIC AND ANAEROBIC 10 CC EACH  Final   Culture   Final    NO GROWTH 5 DAYS Performed at [facility], [place]  [address], [place], [place] [address]    Report Status 10/31/2018 FINAL  Final  Gram stain     Status: None   Collection Time: 10/26/18 10:26 AM   Specimen: Ascitic  Result Value Ref Range Status   Specimen Description ASCITIC  Final   Special Requests NONE  Final   Gram Stain   Final    NO ORGANISMS SEEN WBC PRESENT, PREDOMINANTLY MONONUCLEAR CYTOSPIN SMEAR Performed at [facility], [address] [place], [place] [address]    Report Status 10/26/2018 FINAL  Final     Scheduled Meds: . aspirin EC  81 mg Oral QHS  . atorvastatin  10 mg Oral q1800  . Chlorhexidine Gluconate Cloth  6 each Topical Q0600  . diltiazem  30 mg Oral Q6H  . enoxaparin (LOVENOX) injection  30 mg Subcutaneous Q24H  . insulin aspart  0-15 Units Subcutaneous TID WC  . insulin aspart  0-5 Units Subcutaneous QHS  . levothyroxine  75 mcg Oral QAC breakfast  . nystatin   Topical TID   Continuous Infusions: . albumin human 12.5 g (10/31/18 1356)  . cefTRIAXone (ROCEPHIN)  IV 1 g (10/30/18 2250)  . furosemide 160 mg (10/31/18 1452)    Procedures/Studies: Ct Abdomen Pelvis Wo Contrast  Result Date: 10/25/2018 CLINICAL DATA:  Acute renal injury, history of prior hysterectomy EXAM: CT ABDOMEN AND PELVIS WITHOUT CONTRAST TECHNIQUE: Multidetector CT imaging of the abdomen and pelvis was performed following the standard protocol without IV contrast. COMPARISON:  04/22/2018 FINDINGS: Lower chest: Large left pleural effusion is noted with underlying left basilar atelectasis. The right lung base is clear.  Hepatobiliary: Shrunken liver with nodular contour consistent with underlying cirrhosis. No focal mass is noted. The gallbladder is well distended with dependent gallstones similar to that noted on the prior exam. Pancreas: Unremarkable. No pancreatic ductal dilatation or surrounding inflammatory changes. Spleen: Normal in size without focal abnormality. Adrenals/Urinary Tract: Adrenal glands are within normal limits. Small exophytic cyst is noted arising from the right kidney similar to that seen on prior exam. No renal calculi or obstructive changes are seen. The bladder is decompressed by Foley catheter. Stomach/Bowel: Appendix is not well visualized although no inflammatory changes are seen. No obstructive or inflammatory changes of large or small bowel are seen. The stomach is within normal limits. Vascular/Lymphatic: Aortic atherosclerosis. No enlarged abdominal or pelvic lymph nodes. Reproductive: Uterus is been surgically removed consistent with the given clinical history. Other: Significant ascites is noted new from the prior exam. This may be related to the underlying portal hypertension. Musculoskeletal: No acute or significant osseous findings. IMPRESSION: Changes of hepatic cirrhosis and significant ascites which is new from the prior exam. New moderate left-sided pleural effusion. Electronically Signed   By: Inez Catalina M.D.   On: 10/25/2018 21:34   Dg Chest 2 View  Result Date: 10/25/2018 CLINICAL DATA:  Shortness of breath EXAM: CHEST - 2 VIEW COMPARISON:  Chest CT April 22, 2018 FINDINGS: There is airspace consolidation in the left lower lobe with small left pleural effusion. The lungs elsewhere are clear. Heart size and pulmonary vascularity are normal. No adenopathy. No bone lesions. IMPRESSION: Left lower lobe airspace consolidation, felt to represent a degree of pneumonia. Small left pleural effusion. Lungs elsewhere clear. Cardiac silhouette normal. No adenopathy. Followup PA and lateral  chest radiographs recommended in 3-4 weeks following trial of antibiotic therapy to ensure resolution and exclude underlying malignancy. Electronically Signed   By: Lowella Grip III M.D.   On: 10/25/2018 17:31  US Renal  Result Date: 10/26/2018 CLINICAL DATA:  Acute kidney injury EXAM: RENAL / URINARY TRACT ULTRASOUND COMPLETE COMPARISON:  CT abdomen pelvis October 25, 2018 FINDINGS: Right Kidney: Renal measurements: 11.3 x 4.9 x 7.1 cm. = volume: 206 mL. Echogenicity is normal. There is a small anechoic cyst seen within the midpole measuring 0.9 x 1.0 x 0.9 cm. Left Kidney: Renal measurements: 11.8 x 5.2 x 5.8 cm = volume: 187 mL. Echogenicity within normal limits. No mass or hydronephrosis visualized. Bladder: Decompressed due to Foley catheter in place. Abdominopelvic ascites is seen. IMPRESSION: Small right midpole renal cyst.  Otherwise normal appearing kidneys. Electronically Signed   By: Prudencio Pair M.D.   On: 10/26/2018 11:49   US Paracentesis  Result Date: 10/26/2018 INDICATION: Cirrhosis, ascites EXAM: ULTRASOUND GUIDED DIAGNOSTIC AND THERAPEUTIC PARACENTESIS MEDICATIONS: None. COMPLICATIONS: None immediate. PROCEDURE: Procedure, benefits, and risks of procedure were discussed with patient. Written informed consent for procedure was obtained. Time out protocol followed. Adequate collection of ascites localized by ultrasound in RIGHT lower quadrant. Skin prepped and draped in usual sterile fashion. Skin and soft tissues anesthetized with 10 mL of 1% lidocaine. 5 [place] Yueh catheter placed into peritoneal cavity. 2 L of amber colored ascitic fluid aspirated by vacuum bottle suction. Volume removed limited to 2 L at request of ordering physician. Procedure tolerated well by patient without immediate complication. FINDINGS: A total of approximately 2 L of ascitic fluid was removed. Samples were sent to the laboratory as requested by the clinical team. IMPRESSION: Successful ultrasound-guided  paracentesis yielding 2 liters of peritoneal fluid. Electronically Signed   By: Lavonia Dana M.D.   On: 10/26/2018 11:29   Roxan Hockey, MD  [facility]  If 7PM-7AM, please contact night-coverage [email or web address]  10/31/2018, 3:54 PM   LOS: 6 days

## 2018-11-01 LAB — RENAL FUNCTION PANEL
Albumin: 2.7 g/dL — ABNORMAL LOW (ref 3.5–5.0)
Anion gap: 8 (ref 5–15)
BUN: 46 mg/dL — ABNORMAL HIGH (ref 8–23)
CO2: 26 mmol/L (ref 22–32)
Calcium: 8 mg/dL — ABNORMAL LOW (ref 8.9–10.3)
Chloride: 101 mmol/L (ref 98–111)
Creatinine, Ser: 2.18 mg/dL — ABNORMAL HIGH (ref 0.44–1.00)
GFR calc Af Amer: 25 mL/min — ABNORMAL LOW (ref >60)
GFR calc non Af Amer: 22 mL/min — ABNORMAL LOW (ref >60)
Glucose, Bld: 95 mg/dL (ref 70–99)
Phosphorus: 3.1 mg/dL (ref 2.5–4.6)
Potassium: 3.5 mmol/L (ref 3.5–5.1)
Sodium: 135 mmol/L (ref 135–145)

## 2018-11-01 LAB — GLUCOSE, CAPILLARY
Glucose-Capillary: 115 mg/dL — ABNORMAL HIGH (ref 70–99)
Glucose-Capillary: 127 mg/dL — ABNORMAL HIGH (ref 70–99)
Glucose-Capillary: 151 mg/dL — ABNORMAL HIGH (ref 70–99)
Glucose-Capillary: 110 mg/dL — ABNORMAL HIGH (ref 70–99)

## 2018-11-01 MED ORDER — FUROSEMIDE 10 MG/ML IJ SOLN
120.0000 mg | Freq: Three times a day (TID) | INTRAVENOUS | Status: AC
  Administered 2018-11-01 – 2018-11-04 (×10): 120 mg via INTRAVENOUS
  Filled 2018-11-01 (×12): qty 12

## 2018-11-01 MED ORDER — POTASSIUM CHLORIDE CRYS ER 20 MEQ PO TBCR
20.0000 meq | EXTENDED_RELEASE_TABLET | Freq: Two times a day (BID) | ORAL | Status: AC
  Administered 2018-11-01 – 2018-11-02 (×3): 20 meq via ORAL
  Filled 2018-11-01 (×3): qty 1

## 2018-11-01 NOTE — Progress Notes (Signed)
AMION provider contacted and made aware the patient was C/O pain to BLE. -pain med requested

## 2018-11-01 NOTE — Progress Notes (Signed)
Patient ID: KAINAT PIZANA, female   DOB: Feb 13, 1946, 73 y.o.   MRN: [id number] S: 2700 UOP recorded, minus 2 liters, weight down  - BUN and crt trending better still.  She had cramps last night and then was nauseated this AM     O:BP (!) 103/45   Pulse 78   Temp 98.3 F (36.8 C) (Oral)   Resp 18   Ht 5\' 3"  (1.6 m)   Wt 98.8 kg   SpO2 97%   BMI 38.58 kg/m   Intake/Output Summary (Last 24 hours) at 11/01/2018 1153 Last data filed at 11/01/2018 0700 Gross per 24 hour  Intake 224.09 ml  Output 2700 ml  Net -2475.91 ml   Intake/Output: I/O last 3 completed shifts: In: 382.4 [IV Piggyback:382.4] Out: 3000 [Urine:3000]  Intake/Output this shift:  No intake/output data recorded. Weight change: -0.5 kg Gen: NAD, sitting in chair  CVS: no rub Resp: decreased BS at bases Abd: distended, +BS, soft Ext:3+ anasarca  Recent Labs  Lab 10/25/18 1726 10/26/18 0543 10/27/18 3790 10/28/18 0646 10/29/18 0510 10/30/18 0525 10/31/18 0551 11/01/18 0601  NA 134* 135 138 139 138  138 138 140  140 135  K 3.9 4.4 4.6 4.4 4.2  4.1 4.3 3.9  3.9 3.5  CL 100 104 106 106 108  107 107 106  106 101  CO2 22 22 23 24 24  23 25 26  25 26   GLUCOSE 124* 86 84 95 106*  107* 105* 90  92 95  BUN 70* 71* 69* 66* 62*  61* 58* 56*  56* 46*  CREATININE 3.94* 3.96* 3.27* 2.84* 2.58*  2.58* 2.47* 2.27*  2.24* 2.18*  ALBUMIN 2.5*  --  2.5* 2.4*  2.4* 3.0*  3.1* 2.8* 2.6*  2.6* 2.7*  CALCIUM 8.2* 7.9* 8.1* 8.2* 8.2*  8.2* 8.1* 8.3*  8.3* 8.0*  PHOS  --   --   --  3.9 3.3 3.3 3.1 3.1  AST 29  --  16 21 18   --  19  --   ALT 14  --  11 9 10   --  10  --    Liver Function Tests: Recent Labs  Lab 10/28/18 0646 10/29/18 0510 10/30/18 0525 10/31/18 0551 11/01/18 0601  AST 21 18  --  19  --   ALT 9 10  --  10  --   ALKPHOS 48 28*  --  31*  --   BILITOT 2.3* 2.3*  --  1.9*  --   PROT 5.1* 5.3*  --  5.1*  --   ALBUMIN 2.4*  2.4* 3.0*  3.1* 2.8* 2.6*  2.6* 2.7*   No results for input(s):  LIPASE, AMYLASE in the last 168 hours. No results for input(s): AMMONIA in the last 168 hours. CBC: Recent Labs  Lab 10/25/18 1726 10/27/18 0613 10/29/18 0510 10/31/18 0551  WBC 9.5 7.1 7.1 6.8  NEUTROABS 5.3  --   --   --   HGB 11.3* 9.7* 9.2* 8.8*  HCT 34.2* 29.6* 28.2* 27.4*  MCV 99.1 98.0 98.3 100.4*  PLT 213 155 129* 118*   Cardiac Enzymes: No results for input(s): CKTOTAL, CKMB, CKMBINDEX, TROPONINI in the last 168 hours. CBG: Recent Labs  Lab 10/31/18 1128 10/31/18 1616 10/31/18 2118 11/01/18 0751 11/01/18 1128  GLUCAP 143* 113* 120* 115* 127*    Iron Studies: No results for input(s): IRON, TIBC, TRANSFERRIN, FERRITIN in the last 72 hours. Studies/Results: No results found. Marland Kitchen aspirin EC  81  mg Oral QHS  . atorvastatin  10 mg Oral q1800  . Chlorhexidine Gluconate Cloth  6 each Topical Q0600  . diltiazem  30 mg Oral Q6H  . enoxaparin (LOVENOX) injection  30 mg Subcutaneous Q24H  . insulin aspart  0-15 Units Subcutaneous TID WC  . insulin aspart  0-5 Units Subcutaneous QHS  . levothyroxine  75 mcg Oral QAC breakfast  . nystatin   Topical TID    BMET    Component Value Date/Time   NA 135 11/01/2018 0601   K 3.5 11/01/2018 0601   CL 101 11/01/2018 0601   CO2 26 11/01/2018 0601   GLUCOSE 95 11/01/2018 0601   BUN 46 (H) 11/01/2018 0601   CREATININE 2.18 (H) 11/01/2018 0601   CALCIUM 8.0 (L) 11/01/2018 0601   GFRNONAA 22 (L) 11/01/2018 0601   GFRAA 25 (L) 11/01/2018 0601   CBC    Component Value Date/Time   WBC 6.8 10/31/2018 0551   RBC 2.73 (L) 10/31/2018 0551   HGB 8.8 (L) 10/31/2018 0551   HCT 27.4 (L) 10/31/2018 0551   PLT 118 (L) 10/31/2018 0551   MCV 100.4 (H) 10/31/2018 0551   MCH 32.2 10/31/2018 0551   MCHC 32.1 10/31/2018 0551   RDW 15.0 10/31/2018 0551   LYMPHSABS 1.8 10/25/2018 1726   MONOABS 1.8 (H) 10/25/2018 1726   EOSABS 0.4 10/25/2018 1726   BASOSABS 0.1 10/25/2018 1726   Assessment/Plan:  1. AKI- in setting of decompensated  cirrhosis with anasarca/ascites and intravascular volume depletion in setting of concomitant ARB therapy. Since stopping the ARB and diuretics, her Cr has continued to improve from a peak 3.96 down to 2.18 today   1. Avoid NSAIDs/ACE/ARB or nephrotoxic agents such as IV contrast 2. Limit large volume paracenteses to 2 liters and administer albumin 3. continue to follow UOP and Scr  4. Sodium restriction 2 grams/day 5. Fluid restriction to 1.5 liters/day 2. Cirrhosis- new diagnosis presumably due to hepatic steatosis. Workup underway per GI. Hepatitis panel negative. 3. Anasarca- restarted IV furosemide 80 mg 10/29/18 (last dose 10/25/18) without significant diuresis. 1. Goal UF of 1-2 liters/day to help limit further ischemic injury related to hemodynamic shifts.  2. Increase IV lasix to 160 mg tid  - add on some albumin as well - finally success but leg cramping and feels badly this AM, maybe pushed too fr- will back off a little to 120 q 8 hours  3. Due to needing very high dose diuretics and albumin infusion to diurese, in my opinion needs to remain inpatient status to monitor blood pressure and electrolytes  4. Hyponatremia- hypervolemic. Improved 5. Anemia- normal iron stores. Unclear if she has had variceal bleeding. GI following. hgb dropping, not helping our oncotic situation- will check hgb tomorrow and dose with ESA if still low    6. Hypokalemia-  Replete today and tomorrow   Louis Meckel  [employer] [phone]

## 2018-11-01 NOTE — Progress Notes (Signed)
PROGRESS NOTE  Jordan Le NFA:[id number] DOB: [birth year] DOA: 10/25/2018 PCP: Manon Hilding, MD  Brief History:  73 year old female with a history of diabetes mellitus, hypertension, liver cirrhosis, endometrial carcinoma status post hysterectomy February 2020 presented with 51-month history of shortness of breath and dyspnea on exertion as well as decreased oral intake.  She has also complained of increasing peripheral edema and increasing abdominal girth she states that she usually sleeps in a recliner, but is unable to tell me whether she truly has orthopnea or not because she states that she has never tried to lay flat in her life.  She denied any fevers, chills, headache, chest pain, coughing, hemoptysis, nausea, vomiting, diarrhea, abdominal pain.  Notably, the patient states that she was started on furosemide 20 mg daily in early April 2020.  Her dose of furosemide was subsequently increased to 40 mg daily in mid May 2020.  Approximately 3 weeks prior to this admission, spironolactone was added.  She denies any outpatient NSAID use.  She continues on losartan.  The patient states that she was told about 1 month prior to this admission that she had a problem with her kidneys, but this was not investigated further.  In the ED, the patient was afebrile hemodynamically stable saturating 94-96% room air.  Serum creatinine was 3.94.  WBC 9.5, hemoglobin 11.3, platelets 213,000.  CT of the abdomen pelvis showed a large left pleural effusion with left basilar atelectasis.  There was a shrunken nodular liver consistent with liver cirrhosis.  There was no bowel wall thickening.  There was significant ascites.  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 -10/26/18--paracentesis--2L removed -Hepatitis B surface antigen--neg -Hepatitis C antibody--neg -Antimitochondrial antibody negative -Anti-smooth muscle antibody negative -Alpha-1  antitrypsin level--normal -Alpha-fetoprotein pending -ANA negative -Initially furosemide and spironolactone were discontinued due to  AKI -10/26/18 paracentesis--3 L removed--> albumin given after paracentesis  -Continue ceftriaxone for SBP prophylaxis -Still very volume overloaded,  -per nephrologist patient may not be tolerating Lasix IV 160 mg 3 times daily well (had significant leg cramps and nausea)-urine output increased and her weight went down however --Per nephrologist okay to change IV Lasix 220 mg 3 times a day Due to needing very high dose diuretics (Lasix iv 120 to 160 mg TID) and albumin infusion to diurese, with significant risk for hemodynamic instability (especially given very soft BP and tachycardia at baseline) and risk of electrolyte disturbances with very very high-dose diuretics --- Pt needs to remain inpatient status to monitor blood pressure and electrolytes while aggressively diuresing the as noted above  Acute Kidney Injury -Likely due to hemodynamic changes in the setting of furosemide, spironolactone, losartan use -Urine sodium and creatinine <1 -Renal ultrasound negative for hydronephrosis -Nephrology consultation appreciated -urine protein/creatinine ratio--0.25 -Creatinine improved from a peak of  3.96 down to 2.17  Left pleural effusion --per Dr. Carmelina Dane overall small--defer Inocencio Homes for now -reassess for thora if respiratory status worsens again -echo shows normal EF -suspect hepatothorax  SVT/Atrial tachycardia -personally reviewed EKG--SVT, nonspecific T wave change -start po diltiazem -Echo shows normal EF -TSH 5.4  LLE Stasis Dermatitis -doubt cellulitis -present x 3 months -already on ceftriaxone without change -Should hopefully improve that she diuresis -Ace wraps to bilateral lower extremities in graduated pressure fashion from toes to just below the knee ---  -C/n Lorita Officer and ACE Wraps daily  Diabetes mellitus type  2 -8/3/20Hemoglobin A1c--4.7 -NovoLog sliding scale -Discontinued metformin  Hypothyroidism -Continue Synthroid  Endometrial Carcinoma -Status post hysterectomy 05/07/2018 -follow up Dr. Denman George  Hyperlipidemia -continue statin  Disposition/Need for in-Hospital Stay- patient unable to be discharged at this time due to Pt needing very high dose diuretics (Lasix iv 120 to 160 mg TID) and albumin infusion to diurese, with significant risk for hemodynamic instability (especially given very soft BP and tachycardia at baseline) and risk of electrolyte disturbances with very very high-dose diuretics --- Pt needs to remain inpatient status to monitor blood pressure and electrolytes while aggressively diuresing the as noted above    Disposition Plan:   Needs iv Lasix and iv Albulmin, needs close monitoring of labs and blood pressure with aggressive very high dose diuretics  Family Communication:   Granddaughter updated peviously Consultants:  GI, renal  Code Status:  FULL   DVT Prophylaxis:   Holden Lovenox  Procedures: As Listed in Progress Note Above  Antibiotics: Ceftriaxone 8/2>>>8/3   Subjective: C/o significant leg cramps, -Voiding much better -Some dyspnea on exertion persist  Objective: Vitals:   11/01/18 0000 11/01/18 0500 11/01/18 0612 11/01/18 1152  BP: (!) 99/51  (!) 118/54 (!) 103/45  Pulse:   (!) 119 78  Resp:    16  Temp:   98.3 F (36.8 C)   TempSrc:   Oral   SpO2:   97% 98%  Weight:  98.8 kg    Height:        Intake/Output Summary (Last 24 hours) at 11/01/2018 1654 Last data filed at 11/01/2018 1500 Gross per 24 hour  Intake 385.87 ml  Output 3200 ml  Net -2814.13 ml   Weight change: -0.5 kg Exam:  General exam: Alert, awake, oriented x 3 Respiratory system: Faint bibasilar rales dyspnea on exertion cardiovascular system:RRR. No murmurs, rubs, gallops. Gastrointestinal system: Abdomen is distended, soft and nontender.   Normal bowel sounds  heard. Central nervous system: Alert and oriented. No focal neurological deficits. Extremities: 3+ edema lower extremities bilaterally / Anasarca Skin: Venous stasis changes in lower extremities bilaterally  Psychiatry: Judgement and insight appear normal. Mood & affect appropriate.   Data Reviewed:   Basic Metabolic Panel: Recent Labs  Lab 10/25/18 1726  10/28/18 0646 10/29/18 0510 10/30/18 0525 10/31/18 0551 11/01/18 0601  NA 134*   < > 139 138   138 138 140   140 135  K 3.9   < > 4.4 4.2   4.1 4.3 3.9   3.9 3.5  CL 100   < > 106 108   107 107 106   106 101  CO2 22   < > 24 24   23 25 26   25 26   GLUCOSE 124*   < > 95 106*   107* 105* 90   92 95  BUN 70*   < > 66* 62*   61* 58* 56*   56* 46*  CREATININE 3.94*   < > 2.84* 2.58*   2.58* 2.47* 2.27*   2.24* 2.18*  CALCIUM 8.2*   < > 8.2* 8.2*   8.2* 8.1* 8.3*   8.3* 8.0*  MG 2.4  --   --   --   --   --   --   PHOS  --   --  3.9 3.3 3.3 3.1 3.1   < > = values in this interval not displayed.   Liver Function Tests: Recent Labs  Lab 10/25/18 1726 10/27/18 7341 10/28/18 9379 10/29/18 0510 10/30/18 0525 10/31/18 0551 11/01/18 0601  AST 29 16  21 18  --  19  --   ALT 14 11 9 10   --  10  --   ALKPHOS 53 29* 48 28*  --  31*  --   BILITOT 3.1* 2.5* 2.3* 2.3*  --  1.9*  --   PROT 6.5 5.2* 5.1* 5.3*  --  5.1*  --   ALBUMIN 2.5* 2.5* 2.4*   2.4* 3.0*   3.1* 2.8* 2.6*   2.6* 2.7*   No results for input(s): LIPASE, AMYLASE in the last 168 hours. No results for input(s): AMMONIA in the last 168 hours. Coagulation Profile: Recent Labs  Lab 10/26/18 0543 10/27/18 0613 10/29/18 0917  INR 2.1* 2.2* 2.0*   CBC: Recent Labs  Lab 10/25/18 1726 10/27/18 0613 10/29/18 0510 10/31/18 0551  WBC 9.5 7.1 7.1 6.8  NEUTROABS 5.3  --   --   --   HGB 11.3* 9.7* 9.2* 8.8*  HCT 34.2* 29.6* 28.2* 27.4*  MCV 99.1 98.0 98.3 100.4*  PLT 213 155 129* 118*   Cardiac Enzymes: No results for input(s): CKTOTAL, CKMB, CKMBINDEX, TROPONINI in  the last 168 hours. BNP: Invalid input(s): POCBNP CBG: Recent Labs  Lab 10/31/18 1616 10/31/18 2118 11/01/18 0751 11/01/18 1128 11/01/18 1608  GLUCAP 113* 120* 115* 127* 151*   HbA1C: No results for input(s): HGBA1C in the last 72 hours. Urine analysis:    Component Value Date/Time   COLORURINE AMBER (A) 10/25/2018 2100   APPEARANCEUR CLOUDY (A) 10/25/2018 2100   LABSPEC 1.013 10/25/2018 2100   PHURINE 5.0 10/25/2018 2100   GLUCOSEU NEGATIVE 10/25/2018 2100   HGBUR SMALL (A) 10/25/2018 2100   [place] NEGATIVE 10/25/2018 2100   [place] NEGATIVE 10/25/2018 2100   PROTEINUR NEGATIVE 10/25/2018 2100   NITRITE NEGATIVE 10/25/2018 2100   LEUKOCYTESUR SMALL (A) 10/25/2018 2100   Sepsis Labs: @LABRCNTIP (procalcitonin:4,lacticidven:4) ) Recent Results (from the past 240 hour(s))  SARS CORONAVIRUS 2 Nasal Swab Aptima Multi Swab     Status: None   Collection Time: 10/25/18  5:23 PM   Specimen: Aptima Multi Swab; Nasal Swab  Result Value Ref Range Status   SARS Coronavirus 2 NEGATIVE NEGATIVE Final    Comment: (NOTE) SARS-CoV-2 target nucleic acids are NOT DETECTED. The SARS-CoV-2 RNA is generally detectable in upper and lower respiratory specimens during the acute phase of infection. Negative results do not preclude SARS-CoV-2 infection, do not rule out co-infections with other pathogens, and should not be used as the sole basis for treatment or other patient management decisions. Negative results must be combined with clinical observations, patient history, and epidemiological information. The expected result is Negative. Fact Sheet for Patients: SugarRoll.be Fact Sheet for Healthcare Providers: https://www.woods-mathews.com/ This test is not yet approved or cleared by the [place] FDA and  has been authorized for detection and/or diagnosis of SARS-CoV-2 by FDA under an Emergency Use Authorization (EUA). This EUA will  remain  in effect (meaning this test can be used) for the duration of the COVID-19 declaration under Section 56 4(b)(1) of the Act, 21 U.S.C. section 360bbb-3(b)(1), unless the authorization is terminated or revoked sooner. Performed at [facility], [place] [address], [place], [place] [address]   Urine culture     Status: None   Collection Time: 10/25/18  9:45 PM   Specimen: Urine, Catheterized  Result Value Ref Range Status   Specimen Description   Final    URINE, CATHETERIZED Performed at [facility], [address], [place],  [address]    Special  Requests   Final    NONE Performed at [facility], [address], [place], [place] [address]    Culture   Final    NO GROWTH Performed at [facility], [place] [address] [place], [place] [address]    Report Status 10/27/2018 FINAL  Final  MRSA PCR Screening     Status: None   Collection Time: 10/25/18 11:17 PM   Specimen: Nasal Mucosa; Nasopharyngeal  Result Value Ref Range Status   MRSA by PCR NEGATIVE NEGATIVE Final    Comment:        The GeneXpert MRSA Assay (FDA approved for NASAL specimens only), is one component of a comprehensive MRSA colonization surveillance program. It is not intended to diagnose MRSA infection nor to guide or monitor treatment for MRSA infections. Performed at [facility], [address], [place], [place] [address]   Culture, body fluid-bottle     Status: None   Collection Time: 10/26/18 10:26 AM   Specimen: Ascitic  Result Value Ref Range Status   Specimen Description ASCITIC  Final   Special Requests BOTTLES DRAWN AEROBIC AND ANAEROBIC 10 CC EACH  Final   Culture   Final    NO GROWTH 5 DAYS Performed at [facility], [address] [place], [place] [address]    Report Status 10/31/2018 FINAL  Final  Gram stain     Status: None   Collection Time: 10/26/18 10:26 AM   Specimen: Ascitic  Result Value Ref Range Status   Specimen Description ASCITIC  Final   Special  Requests NONE  Final   Gram Stain   Final    NO ORGANISMS SEEN WBC PRESENT, PREDOMINANTLY MONONUCLEAR CYTOSPIN SMEAR Performed at [facility], [address], [place], [place] [address]    Report Status 10/26/2018 FINAL  Final     Scheduled Meds:  aspirin EC  81 mg Oral QHS   atorvastatin  10 mg Oral q1800   Chlorhexidine Gluconate Cloth  6 each Topical Q0600   diltiazem  30 mg Oral Q6H   enoxaparin (LOVENOX) injection  30 mg Subcutaneous Q24H   insulin aspart  0-15 Units Subcutaneous TID WC   insulin aspart  0-5 Units Subcutaneous QHS   levothyroxine  75 mcg Oral QAC breakfast   nystatin   Topical TID   potassium chloride  20 mEq Oral BID   Continuous Infusions:  albumin human 12.5 g (11/01/18 0917)   cefTRIAXone (ROCEPHIN)  IV 1 g (10/31/18 2232)   furosemide 120 mg (11/01/18 1350)    Procedures/Studies: Ct Abdomen Pelvis Wo Contrast  Result Date: 10/25/2018 CLINICAL DATA:  Acute renal injury, history of prior hysterectomy EXAM: CT ABDOMEN AND PELVIS WITHOUT CONTRAST TECHNIQUE: Multidetector CT imaging of the abdomen and pelvis was performed following the standard protocol without IV contrast. COMPARISON:  04/22/2018 FINDINGS: Lower chest: Large left pleural effusion is noted with underlying left basilar atelectasis. The right lung base is clear. Hepatobiliary: Shrunken liver with nodular contour consistent with underlying cirrhosis. No focal mass is noted. The gallbladder is well distended with dependent gallstones similar to that noted on the prior exam. Pancreas: Unremarkable. No pancreatic ductal dilatation or surrounding inflammatory changes. Spleen: Normal in size without focal abnormality. Adrenals/Urinary Tract: Adrenal glands are within normal limits. Small exophytic cyst is noted arising from the right kidney similar to that seen on prior exam. No renal calculi or obstructive changes are seen. The bladder is decompressed by Foley catheter. Stomach/Bowel:  Appendix is not well visualized although  no inflammatory changes are seen. No obstructive or inflammatory changes of large or small bowel are seen. The stomach is within normal limits. Vascular/Lymphatic: Aortic atherosclerosis. No enlarged abdominal or pelvic lymph nodes. Reproductive: Uterus is been surgically removed consistent with the given clinical history. Other: Significant ascites is noted new from the prior exam. This may be related to the underlying portal hypertension. Musculoskeletal: No acute or significant osseous findings. IMPRESSION: Changes of hepatic cirrhosis and significant ascites which is new from the prior exam. New moderate left-sided pleural effusion. Electronically Signed   By: Inez Catalina M.D.   On: 10/25/2018 21:34   Dg Chest 2 View  Result Date: 10/25/2018 CLINICAL DATA:  Shortness of breath EXAM: CHEST - 2 VIEW COMPARISON:  Chest CT April 22, 2018 FINDINGS: There is airspace consolidation in the left lower lobe with small left pleural effusion. The lungs elsewhere are clear. Heart size and pulmonary vascularity are normal. No adenopathy. No bone lesions. IMPRESSION: Left lower lobe airspace consolidation, felt to represent a degree of pneumonia. Small left pleural effusion. Lungs elsewhere clear. Cardiac silhouette normal. No adenopathy. Followup PA and lateral chest radiographs recommended in 3-4 weeks following trial of antibiotic therapy to ensure resolution and exclude underlying malignancy. Electronically Signed   By: Lowella Grip III M.D.   On: 10/25/2018 17:31   US Renal  Result Date: 10/26/2018 CLINICAL DATA:  Acute kidney injury EXAM: RENAL / URINARY TRACT ULTRASOUND COMPLETE COMPARISON:  CT abdomen pelvis October 25, 2018 FINDINGS: Right Kidney: Renal measurements: 11.3 x 4.9 x 7.1 cm. = volume: 206 mL. Echogenicity is normal. There is a small anechoic cyst seen within the midpole measuring 0.9 x 1.0 x 0.9 cm. Left Kidney: Renal measurements: 11.8 x 5.2 x 5.8 cm =  volume: 187 mL. Echogenicity within normal limits. No mass or hydronephrosis visualized. Bladder: Decompressed due to Foley catheter in place. Abdominopelvic ascites is seen. IMPRESSION: Small right midpole renal cyst.  Otherwise normal appearing kidneys. Electronically Signed   By: Prudencio Pair M.D.   On: 10/26/2018 11:49   US Paracentesis  Result Date: 10/26/2018 INDICATION: Cirrhosis, ascites EXAM: ULTRASOUND GUIDED DIAGNOSTIC AND THERAPEUTIC PARACENTESIS MEDICATIONS: None. COMPLICATIONS: None immediate. PROCEDURE: Procedure, benefits, and risks of procedure were discussed with patient. Written informed consent for procedure was obtained. Time out protocol followed. Adequate collection of ascites localized by ultrasound in RIGHT lower quadrant. Skin prepped and draped in usual sterile fashion. Skin and soft tissues anesthetized with 10 mL of 1% lidocaine. 5 [place] Yueh catheter placed into peritoneal cavity. 2 L of amber colored ascitic fluid aspirated by vacuum bottle suction. Volume removed limited to 2 L at request of ordering physician. Procedure tolerated well by patient without immediate complication. FINDINGS: A total of approximately 2 L of ascitic fluid was removed. Samples were sent to the laboratory as requested by the clinical team. IMPRESSION: Successful ultrasound-guided paracentesis yielding 2 liters of peritoneal fluid. Electronically Signed   By: Lavonia Dana M.D.   On: 10/26/2018 11:29   Roxan Hockey, MD  [facility]  If 7PM-7AM, please contact night-coverage [email or web address]  11/01/2018, 4:54 PM   LOS: 7 days

## 2018-11-02 LAB — CBC
HCT: 28.8 % — ABNORMAL LOW (ref 36.0–46.0)
Hemoglobin: 9.5 g/dL — ABNORMAL LOW (ref 12.0–15.0)
MCH: 32.6 pg (ref 26.0–34.0)
MCHC: 33 g/dL (ref 30.0–36.0)
MCV: 99 fL (ref 80.0–100.0)
Platelets: 128 10*3/uL — ABNORMAL LOW (ref 150–400)
RBC: 2.91 MIL/uL — ABNORMAL LOW (ref 3.87–5.11)
RDW: 15.1 % (ref 11.5–15.5)
WBC: 8.7 10*3/uL (ref 4.0–10.5)
nRBC: 0 % (ref 0.0–0.2)

## 2018-11-02 LAB — GLUCOSE, CAPILLARY
Glucose-Capillary: 99 mg/dL (ref 70–99)
Glucose-Capillary: 158 mg/dL — ABNORMAL HIGH (ref 70–99)
Glucose-Capillary: 168 mg/dL — ABNORMAL HIGH (ref 70–99)
Glucose-Capillary: 129 mg/dL — ABNORMAL HIGH (ref 70–99)

## 2018-11-02 LAB — RENAL FUNCTION PANEL
Albumin: 2.9 g/dL — ABNORMAL LOW (ref 3.5–5.0)
Anion gap: 9 (ref 5–15)
BUN: 46 mg/dL — ABNORMAL HIGH (ref 8–23)
CO2: 28 mmol/L (ref 22–32)
Calcium: 8.4 mg/dL — ABNORMAL LOW (ref 8.9–10.3)
Chloride: 101 mmol/L (ref 98–111)
Creatinine, Ser: 1.95 mg/dL — ABNORMAL HIGH (ref 0.44–1.00)
GFR calc Af Amer: 29 mL/min — ABNORMAL LOW (ref >60)
GFR calc non Af Amer: 25 mL/min — ABNORMAL LOW (ref >60)
Glucose, Bld: 100 mg/dL — ABNORMAL HIGH (ref 70–99)
Phosphorus: 3.1 mg/dL (ref 2.5–4.6)
Potassium: 3.8 mmol/L (ref 3.5–5.1)
Sodium: 138 mmol/L (ref 135–145)

## 2018-11-02 MED ORDER — ENSURE ENLIVE PO LIQD
237.0000 mL | Freq: Two times a day (BID) | ORAL | Status: DC
  Administered 2018-11-02: 237 mL via ORAL

## 2018-11-02 MED ORDER — BOOST / RESOURCE BREEZE PO LIQD CUSTOM: 1.0000 | Freq: Three times a day (TID) | ORAL | Status: DC

## 2018-11-02 MED ORDER — ADULT MULTIVITAMIN W/MINERALS CH
1.0000 | ORAL_TABLET | Freq: Every day | ORAL | Status: DC
  Administered 2018-11-02 – 2018-11-05 (×4): 1 via ORAL
  Filled 2018-11-02 (×4): qty 1

## 2018-11-02 NOTE — Care Management Important Message (Signed)
Important Message  Patient Details  Name: Jordan Le MRN: [id number] Date of Birth: Dec 22, 1945   Medicare Important Message Given:  Yes     [name] 11/02/2018, 2:34 PM

## 2018-11-02 NOTE — Progress Notes (Signed)
Patient ID: Jordan Le, female   DOB: 04/16/1945, 73 y.o.   MRN: [id number] S: 1900 UOP recorded, minus 580, weight down  - BUN and crt trending better still.     O:BP (!) 116/54 (BP Location: Left Arm)   Pulse 91   Temp 98.3 F (36.8 C) (Oral)   Resp 17   Ht 5\' 3"  (1.6 m)   Wt 96.5 kg   SpO2 94%   BMI 37.69 kg/m   Intake/Output Summary (Last 24 hours) at 11/02/2018 0831 Last data filed at 11/02/2018 7681 Gross per 24 hour  Intake 1319.8 ml  Output 1900 ml  Net -580.2 ml   Intake/Output: I/O last 3 completed shifts: In: 1543.9 [P.O.:960; IV Piggyback:583.9] Out: 4000 [Urine:4000]  Intake/Output this shift:  No intake/output data recorded. Weight change: -2.3 kg Gen: NAD, sitting in chair  CVS: no rub Resp: decreased BS at bases Abd: distended, +BS, soft Ext:3+ anasarca  Recent Labs  Lab 10/27/18 0613 10/28/18 0646 10/29/18 0510 10/30/18 0525 10/31/18 0551 11/01/18 0601 11/02/18 0515  NA 138 139 138  138 138 140  140 135 138  K 4.6 4.4 4.2  4.1 4.3 3.9  3.9 3.5 3.8  CL 106 106 108  107 107 106  106 101 101  CO2 23 24 24  23 25 26  25 26 28   GLUCOSE 84 95 106*  107* 105* 90  92 95 100*  BUN 69* 66* 62*  61* 58* 56*  56* 46* 46*  CREATININE 3.27* 2.84* 2.58*  2.58* 2.47* 2.27*  2.24* 2.18* 1.95*  ALBUMIN 2.5* 2.4*  2.4* 3.0*  3.1* 2.8* 2.6*  2.6* 2.7* 2.9*  CALCIUM 8.1* 8.2* 8.2*  8.2* 8.1* 8.3*  8.3* 8.0* 8.4*  PHOS  --  3.9 3.3 3.3 3.1 3.1 3.1  AST 16 21 18   --  19  --   --   ALT 11 9 10   --  10  --   --    Liver Function Tests: Recent Labs  Lab 10/28/18 0646 10/29/18 0510  10/31/18 0551 11/01/18 0601 11/02/18 0515  AST 21 18  --  19  --   --   ALT 9 10  --  10  --   --   ALKPHOS 48 28*  --  31*  --   --   BILITOT 2.3* 2.3*  --  1.9*  --   --   PROT 5.1* 5.3*  --  5.1*  --   --   ALBUMIN 2.4*  2.4* 3.0*  3.1*   < > 2.6*  2.6* 2.7* 2.9*   < > = values in this interval not displayed.   No results for input(s): LIPASE, AMYLASE  in the last 168 hours. No results for input(s): AMMONIA in the last 168 hours. CBC: Recent Labs  Lab 10/27/18 0613 10/29/18 0510 10/31/18 0551 11/02/18 0515  WBC 7.1 7.1 6.8 8.7  HGB 9.7* 9.2* 8.8* 9.5*  HCT 29.6* 28.2* 27.4* 28.8*  MCV 98.0 98.3 100.4* 99.0  PLT 155 129* 118* 128*   Cardiac Enzymes: No results for input(s): CKTOTAL, CKMB, CKMBINDEX, TROPONINI in the last 168 hours. CBG: Recent Labs  Lab 11/01/18 0751 11/01/18 1128 11/01/18 1608 11/01/18 2200 11/02/18 0755  GLUCAP 115* 127* 151* 110* 99    Iron Studies: No results for input(s): IRON, TIBC, TRANSFERRIN, FERRITIN in the last 72 hours. Studies/Results: No results found. Marland Kitchen aspirin EC  81 mg Oral QHS  . atorvastatin  10 mg  Oral q1800  . Chlorhexidine Gluconate Cloth  6 each Topical Q0600  . diltiazem  30 mg Oral Q6H  . enoxaparin (LOVENOX) injection  30 mg Subcutaneous Q24H  . insulin aspart  0-15 Units Subcutaneous TID WC  . insulin aspart  0-5 Units Subcutaneous QHS  . levothyroxine  75 mcg Oral QAC breakfast  . nystatin   Topical TID    BMET    Component Value Date/Time   NA 138 11/02/2018 0515   K 3.8 11/02/2018 0515   CL 101 11/02/2018 0515   CO2 28 11/02/2018 0515   GLUCOSE 100 (H) 11/02/2018 0515   BUN 46 (H) 11/02/2018 0515   CREATININE 1.95 (H) 11/02/2018 0515   CALCIUM 8.4 (L) 11/02/2018 0515   GFRNONAA 25 (L) 11/02/2018 0515   GFRAA 29 (L) 11/02/2018 0515   CBC    Component Value Date/Time   WBC 8.7 11/02/2018 0515   RBC 2.91 (L) 11/02/2018 0515   HGB 9.5 (L) 11/02/2018 0515   HCT 28.8 (L) 11/02/2018 0515   PLT 128 (L) 11/02/2018 0515   MCV 99.0 11/02/2018 0515   MCH 32.6 11/02/2018 0515   MCHC 33.0 11/02/2018 0515   RDW 15.1 11/02/2018 0515   LYMPHSABS 1.8 10/25/2018 1726   MONOABS 1.8 (H) 10/25/2018 1726   EOSABS 0.4 10/25/2018 1726   BASOSABS 0.1 10/25/2018 1726   Assessment/Plan:  1. AKI- in setting of decompensated cirrhosis with anasarca/ascites and intravascular  volume depletion in setting of concomitant ARB therapy. Since stopping the ARB and diuretics, her Cr has continued to improve from a peak 3.96 down to 1.95 today   1. Avoid NSAIDs/ACE/ARB or nephrotoxic agents such as IV contrast 2. Limit large volume paracenteses to 2 liters and administer albumin 3. continue to follow UOP and Scr  4. Sodium restriction 2 grams/day 5. Fluid restriction to 1.5 liters/day 2. Cirrhosis- new diagnosis presumably due to hepatic steatosis. Workup underway per GI. Hepatitis panel negative. 3. Anasarca- restarted IV furosemide 80 mg 10/29/18 without significant diuresis. 1. Goal UF of 1-2 liters/day to help limit further ischemic injury related to hemodynamic shifts- have not been able to hit.  2. Increased IV lasix to 160 mg tid  - add on some albumin as well - finally success but leg cramping and felt badly, maybe pushed too fr- will back off a little to 120 q 8 hours- better but diuresis not as strong.  Pt can tell a difference.  Overall down 16 pounds so far but a ways to go 3. Due to needing very high dose diuretics and albumin infusion to diurese, in my opinion needs to remain inpatient status to monitor blood pressure and electrolytes  4. Hyponatremia- hypervolemic. Improved 5. Anemia- normal iron stores. Unclear if she has had variceal bleeding. GI following. hgb dropping, not helping our oncotic situation- will check hgb tomorrow and dose with ESA if still low- was better, no ESA    6. Hypokalemia-  Repleted over weekend - stable    [place] [phone]

## 2018-11-02 NOTE — Progress Notes (Signed)
Initial Nutrition Assessment  DOCUMENTATION CODES:      INTERVENTION:  Heart Healthy diet   Boost High Protein BID (chocolate)  Previously provided Nutrition therapy edu related to cirrhosis see RD note from 8/3.   NUTRITION DIAGNOSIS:   Inadequate oral intake related to acute illness, chronic illness, poor appetite(pateint reports poor appetite the past 2 months. presents with ascites) as evidenced by  cirrhosis/ascites ( s/p paracentesis 2 liters removed) and meal intake 25-50%.   GOAL: Pt to meet >/= 90% of their estimated nutrition needs      MONITOR:  Po intake, labs and wt trends    REASON FOR ASSESSMENT:   LOS   ASSESSMENT: Patient is a 73 yo female with cirrhosis, ascites (2L) removed on 8/3. PMH: DM-2, Endometrial cancer, Renal disorder, Hypertension and mini-stroke. Paracentesis on 8/3- 2 Liters removed.   Meal intake since admission ranging 25-50% most meals. Feeds herself. Patient reports poor appetite (anorexia) the past 2 months. At baseline able to shop and prepare meals for the home. Review of meal intake shows 25-50% of meals. Reminded pt of importance of consistent intake especially in the setting of liver disease. She is agreeable to add oral supplement daily. She is not on a fluid restriction at this time. At risk for malnutrition given her poor appetite and marginal intake.  Weight status: 97.5 kg on admission 8/2-gained up to 101.9 kg and currently wt 96.5 kg a desirable loss. Patient usual range the past 6 months 94-97 kg. Patient has moderate upper edema and deep pitting edema to BLE.  Intake/Output Summary (Last 24 hours) at 11/02/2018 1238 Last data filed at 11/02/2018 1200 Gross per 24 hour  Intake 1079.8 ml  Output 2600 ml  Net -1520.2 ml     Labs: BUN 46 (H), Cr 1.95 (H) BMP Latest Ref Rng & Units 11/02/2018 11/01/2018 10/31/2018  Glucose 70 - 99 mg/dL 100(H) 95 90  BUN 8 - 23 mg/dL 46(H) 46(H) 56(H)  Creatinine 0.44 - 1.00 mg/dL 1.95(H) 2.18(H)  2.27(H)  Sodium 135 - 145 mmol/L 138 135 140  Potassium 3.5 - 5.1 mmol/L 3.8 3.5 3.9  Chloride 98 - 111 mmol/L 101 101 106  CO2 22 - 32 mmol/L 28 26 26   Calcium 8.9 - 10.3 mg/dL 8.4(L) 8.0(L) 8.3(L)    Diet Order:   Diet Order            Diet Heart Room service appropriate? Yes; Fluid consistency: Thin  Diet effective now              EDUCATION NEEDS:   Education needs have been addressed   Skin:  Skin Assessment: Reviewed RN Assessment(jaundice)  Last BM:  8/8 distended abdomen -gross ascites  Height:   Ht Readings from Last 1 Encounters:  10/26/18 5\' 3"  (1.6 m)    Weight:   Wt Readings from Last 1 Encounters:  11/02/18 96.5 kg    Ideal Body Weight:  52 kg  BMI:  Body mass index is 37.69 kg/m.  Estimated Nutritional Needs:   Kcal:  1728-2016 (MSJ x 1.2-1.4 AF)  Protein:  75-83 gr protein  Fluid:  per MD goals   Colman Cater MS,RD,CSG,LDN Office: [phone] Pager: [phone]

## 2018-11-02 NOTE — Progress Notes (Signed)
PROGRESS NOTE  Jordan Le MEQ:[id number] DOB: [birth year] DOA: 10/25/2018 PCP: Manon Hilding, MD  Brief History:  73 year old female with a history of diabetes mellitus, hypertension, liver cirrhosis, endometrial carcinoma status post hysterectomy February 2020 presented with 5-month history of shortness of breath and dyspnea on exertion as well as decreased oral intake.  She has also complained of increasing peripheral edema and increasing abdominal girth she states that she usually sleeps in a recliner, but is unable to tell me whether she truly has orthopnea or not because she states that she has never tried to lay flat in her life.  She denied any fevers, chills, headache, chest pain, coughing, hemoptysis, nausea, vomiting, diarrhea, abdominal pain.  Notably, the patient states that she was started on furosemide 20 mg daily in early April 2020.  Her dose of furosemide was subsequently increased to 40 mg daily in mid May 2020.  Approximately 3 weeks prior to this admission, spironolactone was added.  She denies any outpatient NSAID use.  She continues on losartan.  The patient states that she was told about 1 month prior to this admission that she had a problem with her kidneys, but this was not investigated further.  In the ED, the patient was afebrile hemodynamically stable saturating 94-96% room air.  Serum creatinine was 3.94.  WBC 9.5, hemoglobin 11.3, platelets 213,000.  CT of the abdomen pelvis showed a large left pleural effusion with left basilar atelectasis.  There was a shrunken nodular liver consistent with liver cirrhosis.  There was no bowel wall thickening.  There was significant ascites.  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 -10/26/18--paracentesis--2L removed -Hepatitis B surface antigen--neg -Hepatitis C antibody--neg -Antimitochondrial antibody negative -Anti-smooth muscle antibody negative -Alpha-1  antitrypsin level--normal -Alpha-fetoprotein pending -ANA negative -Initially furosemide and spironolactone were discontinued due to  AKI -10/26/18 paracentesis--3 L removed--> albumin given after paracentesis  -Completed ceftriaxone for SBP prophylaxis on 11/02/2018 -Still very volume overloaded,  -Urine output increased and her weight went down -Wt is down to 212 Lbs on 11/02/2018 from 228 on 10/26/2018 --Patient requiring very very high dose of IV Lasix  -Per nephrologist  Due to needing very high dose diuretics (Lasix iv 120 to 160 mg TID) and the need for IV albumin infusion to diurese, with significant risk for hemodynamic instability (especially given very soft BP and tachycardia at baseline) and risk of electrolyte disturbances with very very high-dose diuretics --- Pt needs to remain inpatient status to monitor blood pressure and electrolytes while aggressively diuresing the as noted above  Acute Kidney Injury -Likely due to hemodynamic changes in the setting of furosemide, spironolactone, losartan use -Urine sodium and creatinine <1 -Renal ultrasound negative for hydronephrosis -Nephrology consultation appreciated -urine protein/creatinine ratio--0.25 -Creatinine improved from a peak of  3.96 down to 1.95  Left pleural effusion --per Dr. Carmelina Dane overall small--defer Inocencio Homes for now -reassess for thora if respiratory status worsens again -echo shows normal EF -suspect hepatothorax  SVT/Atrial tachycardia -personally reviewed EKG--SVT, nonspecific T wave change -c/n po diltiazem -Echo shows normal EF -TSH 5.4  LLE Stasis Dermatitis -doubt cellulitis -present x 3 months -Completed ceftriaxone without change -Should hopefully improve that she diuresis -Ace wraps to bilateral lower extremities in graduated pressure fashion from toes to just below the knee ---  -C/n Lorita Officer and ACE Wraps daily  Diabetes mellitus type 2 -10/26/18 Hemoglobin A1c--4.7--putting her at risk  for hypoglycemia -NovoLog sliding scale -Discontinued  metformin -  Hypothyroidism -Continue Synthroid  Endometrial Carcinoma -Status post hysterectomy 05/07/2018 -follow up Dr. Denman George  Hyperlipidemia -continue statin  Disposition/Need for in-Hospital Stay- patient unable to be discharged at this time due to Pt needing very high dose diuretics (Lasix iv 120 to 160 mg TID) and albumin infusion to diurese, with significant risk for hemodynamic instability (especially given very soft BP and tachycardia at baseline) and risk of electrolyte disturbances with very very high-dose diuretics --- Pt needs to remain inpatient status to monitor blood pressure and electrolytes while aggressively diuresing the as noted above    Disposition Plan:   Needs iv Lasix and iv Albulmin, needs close monitoring of labs and blood pressure with aggressive very high dose diuretics  Family Communication:   Granddaughter updated peviously, patient is alert and oriented  Consultants:  GI, renal  Code Status:  FULL   DVT Prophylaxis:   [place] Lovenox  Procedures: As Listed in Progress Note Above  Antibiotics: Ceftriaxone 8/2>>>8/3   Subjective: --Voiding a lot more, Leg cramps have improved, No shortness of breath at rest Some dyspnea on exertion persists  Objective: Vitals:   11/02/18 0454 11/02/18 0500 11/02/18 1120 11/02/18 1452  BP: (!) 116/54  (!) 99/48 (!) 96/40  Pulse: 91  (!) 115 83  Resp: 17  20 20   Temp: 98.3 F (36.8 C)  98.4 F (36.9 C) 98.4 F (36.9 C)  TempSrc: Oral  Oral Oral  SpO2: 94%  97% 95%  Weight:  96.5 kg    Height:        Intake/Output Summary (Last 24 hours) at 11/02/2018 1453 Last data filed at 11/02/2018 1200 Gross per 24 hour  Intake 839.8 ml  Output 2100 ml  Net -1260.2 ml   Weight change: -2.3 kg Exam:  General exam: Alert, awake, oriented x 3 Respiratory system: Faint bibasilar rales dyspnea on exertion cardiovascular system:RRR. No murmurs, rubs,  gallops. Gastrointestinal system: Abdomen is distended, soft and nontender.   Normal bowel sounds heard. Central nervous system: Alert and oriented. No focal neurological deficits. Extremities: 3+ edema lower extremities bilaterally / Anasarca Skin: Venous stasis changes in lower extremities bilaterally  Psychiatry: Judgement and insight appear normal. Mood & affect appropriate.   Data Reviewed:   Basic Metabolic Panel: Recent Labs  Lab 10/29/18 0510 10/30/18 0525 10/31/18 0551 11/01/18 0601 11/02/18 0515  NA 138   138 138 140   140 135 138  K 4.2   4.1 4.3 3.9   3.9 3.5 3.8  CL 108   107 107 106   106 101 101  CO2 24   23 25 26   25 26 28   GLUCOSE 106*   107* 105* 90   92 95 100*  BUN 62*   61* 58* 56*   56* 46* 46*  CREATININE 2.58*   2.58* 2.47* 2.27*   2.24* 2.18* 1.95*  CALCIUM 8.2*   8.2* 8.1* 8.3*   8.3* 8.0* 8.4*  PHOS 3.3 3.3 3.1 3.1 3.1   Liver Function Tests: Recent Labs  Lab 10/27/18 3825 10/28/18 0646 10/29/18 0510 10/30/18 0525 10/31/18 0551 11/01/18 0601 11/02/18 0515  AST 16 21 18   --  19  --   --   ALT 11 9 10   --  10  --   --   ALKPHOS 29* 48 28*  --  31*  --   --   BILITOT 2.5* 2.3* 2.3*  --  1.9*  --   --   PROT 5.2*  5.1* 5.3*  --  5.1*  --   --   ALBUMIN 2.5* 2.4*   2.4* 3.0*   3.1* 2.8* 2.6*   2.6* 2.7* 2.9*   No results for input(s): LIPASE, AMYLASE in the last 168 hours. No results for input(s): AMMONIA in the last 168 hours. Coagulation Profile: Recent Labs  Lab 10/27/18 0613 10/29/18 0917  INR 2.2* 2.0*   CBC: Recent Labs  Lab 10/27/18 0613 10/29/18 0510 10/31/18 0551 11/02/18 0515  WBC 7.1 7.1 6.8 8.7  HGB 9.7* 9.2* 8.8* 9.5*  HCT 29.6* 28.2* 27.4* 28.8*  MCV 98.0 98.3 100.4* 99.0  PLT 155 129* 118* 128*   Cardiac Enzymes: No results for input(s): CKTOTAL, CKMB, CKMBINDEX, TROPONINI in the last 168 hours. BNP: Invalid input(s): POCBNP CBG: Recent Labs  Lab 11/01/18 1128 11/01/18 1608 11/01/18 2200 11/02/18 0755  11/02/18 1205  GLUCAP 127* 151* 110* 99 158*   HbA1C: No results for input(s): HGBA1C in the last 72 hours. Urine analysis:    Component Value Date/Time   COLORURINE AMBER (A) 10/25/2018 2100   APPEARANCEUR CLOUDY (A) 10/25/2018 2100   LABSPEC 1.013 10/25/2018 2100   PHURINE 5.0 10/25/2018 2100   GLUCOSEU NEGATIVE 10/25/2018 2100   HGBUR SMALL (A) 10/25/2018 2100   [place] NEGATIVE 10/25/2018 2100   [place] NEGATIVE 10/25/2018 2100   PROTEINUR NEGATIVE 10/25/2018 2100   NITRITE NEGATIVE 10/25/2018 2100   LEUKOCYTESUR SMALL (A) 10/25/2018 2100   Sepsis Labs: @LABRCNTIP (procalcitonin:4,lacticidven:4) ) Recent Results (from the past 240 hour(s))  SARS CORONAVIRUS 2 Nasal Swab Aptima Multi Swab     Status: None   Collection Time: 10/25/18  5:23 PM   Specimen: Aptima Multi Swab; Nasal Swab  Result Value Ref Range Status   SARS Coronavirus 2 NEGATIVE NEGATIVE Final    Comment: (NOTE) SARS-CoV-2 target nucleic acids are NOT DETECTED. The SARS-CoV-2 RNA is generally detectable in upper and lower respiratory specimens during the acute phase of infection. Negative results do not preclude SARS-CoV-2 infection, do not rule out co-infections with other pathogens, and should not be used as the sole basis for treatment or other patient management decisions. Negative results must be combined with clinical observations, patient history, and epidemiological information. The expected result is Negative. Fact Sheet for Patients: SugarRoll.be Fact Sheet for Healthcare Providers: https://www.woods-mathews.com/ This test is not yet approved or cleared by the [place] FDA and  has been authorized for detection and/or diagnosis of SARS-CoV-2 by FDA under an Emergency Use Authorization (EUA). This EUA will remain  in effect (meaning this test can be used) for the duration of the COVID-19 declaration under Section 56 4(b)(1) of the Act, 21  U.S.C. section 360bbb-3(b)(1), unless the authorization is terminated or revoked sooner. Performed at [facility], [place] [address] [place], [place] [address]   Urine culture     Status: None   Collection Time: 10/25/18  9:45 PM   Specimen: Urine, Catheterized  Result Value Ref Range Status   Specimen Description   Final    URINE, CATHETERIZED Performed at [facility], [address] [place], [place] [address]    Special Requests   Final    NONE Performed at [facility], [address], [place], [place] [address]    Culture   Final    NO GROWTH Performed at [facility], [place] [address] [place], [place] [address]    Report Status 10/27/2018 FINAL  Final  MRSA PCR Screening     Status: None  Collection Time: 10/25/18 11:17 PM   Specimen: Nasal Mucosa; Nasopharyngeal  Result Value Ref Range Status   MRSA by PCR NEGATIVE NEGATIVE Final    Comment:        The GeneXpert MRSA Assay (FDA approved for NASAL specimens only), is one component of a comprehensive MRSA colonization surveillance program. It is not intended to diagnose MRSA infection nor to guide or monitor treatment for MRSA infections. Performed at [facility], [address], [place], [place] [address]   Culture, body fluid-bottle     Status: None   Collection Time: 10/26/18 10:26 AM   Specimen: Ascitic  Result Value Ref Range Status   Specimen Description ASCITIC  Final   Special Requests BOTTLES DRAWN AEROBIC AND ANAEROBIC 10 CC EACH  Final   Culture   Final    NO GROWTH 5 DAYS Performed at [facility], [address] [place], [place] [address]    Report Status 10/31/2018 FINAL  Final  Gram stain     Status: None   Collection Time: 10/26/18 10:26 AM   Specimen: Ascitic  Result Value Ref Range Status   Specimen Description ASCITIC  Final   Special Requests NONE  Final   Gram Stain   Final    NO ORGANISMS SEEN WBC PRESENT, PREDOMINANTLY MONONUCLEAR CYTOSPIN SMEAR Performed at [facility], [address] [place], [place] [address]    Report Status 10/26/2018 FINAL  Final     Scheduled Meds:  aspirin EC  81 mg Oral QHS   atorvastatin  10 mg Oral q1800   Chlorhexidine Gluconate Cloth  6 each Topical Q0600   diltiazem  30 mg Oral Q6H   enoxaparin (LOVENOX) injection  30 mg Subcutaneous Q24H   feeding supplement (ENSURE ENLIVE)  237 mL Oral BID BM   insulin aspart  0-15 Units Subcutaneous TID WC   insulin aspart  0-5 Units Subcutaneous QHS   levothyroxine  75 mcg Oral QAC breakfast   multivitamin with minerals  1 tablet Oral Daily   nystatin   Topical TID   Continuous Infusions:  albumin human 12.5 g (11/02/18 1115)   furosemide 120 mg (11/02/18 1404)    Procedures/Studies: Ct Abdomen Pelvis Wo Contrast  Result Date: 10/25/2018 CLINICAL DATA:  Acute renal injury, history of prior hysterectomy EXAM: CT ABDOMEN AND PELVIS WITHOUT CONTRAST TECHNIQUE: Multidetector CT imaging of the abdomen and pelvis was performed following the standard protocol without IV contrast. COMPARISON:  04/22/2018 FINDINGS: Lower chest: Large left pleural effusion is noted with underlying left basilar atelectasis. The right lung base is clear. Hepatobiliary: Shrunken liver with nodular contour consistent with underlying cirrhosis. No focal mass is noted. The gallbladder is well distended with dependent gallstones similar to that noted on the prior exam. Pancreas: Unremarkable. No pancreatic ductal dilatation or surrounding inflammatory changes. Spleen: Normal in size without focal abnormality. Adrenals/Urinary Tract: Adrenal glands are within normal limits. Small exophytic cyst is noted arising from the right kidney similar to that seen on prior exam. No renal calculi or obstructive changes are seen. The bladder is decompressed by Foley catheter. Stomach/Bowel: Appendix is not well visualized although no inflammatory changes are seen. No obstructive or inflammatory changes of large or small  bowel are seen. The stomach is within normal limits. Vascular/Lymphatic: Aortic atherosclerosis. No enlarged abdominal or pelvic lymph nodes. Reproductive: Uterus is been surgically removed consistent with the given clinical history. Other: Significant ascites is noted new from the prior exam. This may be related to the underlying  portal hypertension. Musculoskeletal: No acute or significant osseous findings. IMPRESSION: Changes of hepatic cirrhosis and significant ascites which is new from the prior exam. New moderate left-sided pleural effusion. Electronically Signed   By: Inez Catalina M.D.   On: 10/25/2018 21:34   Dg Chest 2 View  Result Date: 10/25/2018 CLINICAL DATA:  Shortness of breath EXAM: CHEST - 2 VIEW COMPARISON:  Chest CT April 22, 2018 FINDINGS: There is airspace consolidation in the left lower lobe with small left pleural effusion. The lungs elsewhere are clear. Heart size and pulmonary vascularity are normal. No adenopathy. No bone lesions. IMPRESSION: Left lower lobe airspace consolidation, felt to represent a degree of pneumonia. Small left pleural effusion. Lungs elsewhere clear. Cardiac silhouette normal. No adenopathy. Followup PA and lateral chest radiographs recommended in 3-4 weeks following trial of antibiotic therapy to ensure resolution and exclude underlying malignancy. Electronically Signed   By: Lowella Grip III M.D.   On: 10/25/2018 17:31   US Renal  Result Date: 10/26/2018 CLINICAL DATA:  Acute kidney injury EXAM: RENAL / URINARY TRACT ULTRASOUND COMPLETE COMPARISON:  CT abdomen pelvis October 25, 2018 FINDINGS: Right Kidney: Renal measurements: 11.3 x 4.9 x 7.1 cm. = volume: 206 mL. Echogenicity is normal. There is a small anechoic cyst seen within the midpole measuring 0.9 x 1.0 x 0.9 cm. Left Kidney: Renal measurements: 11.8 x 5.2 x 5.8 cm = volume: 187 mL. Echogenicity within normal limits. No mass or hydronephrosis visualized. Bladder: Decompressed due to Foley  catheter in place. Abdominopelvic ascites is seen. IMPRESSION: Small right midpole renal cyst.  Otherwise normal appearing kidneys. Electronically Signed   By: Prudencio Pair M.D.   On: 10/26/2018 11:49   US Paracentesis  Result Date: 10/26/2018 INDICATION: Cirrhosis, ascites EXAM: ULTRASOUND GUIDED DIAGNOSTIC AND THERAPEUTIC PARACENTESIS MEDICATIONS: None. COMPLICATIONS: None immediate. PROCEDURE: Procedure, benefits, and risks of procedure were discussed with patient. Written informed consent for procedure was obtained. Time out protocol followed. Adequate collection of ascites localized by ultrasound in RIGHT lower quadrant. Skin prepped and draped in usual sterile fashion. Skin and soft tissues anesthetized with 10 mL of 1% lidocaine. 5 [place] Yueh catheter placed into peritoneal cavity. 2 L of amber colored ascitic fluid aspirated by vacuum bottle suction. Volume removed limited to 2 L at request of ordering physician. Procedure tolerated well by patient without immediate complication. FINDINGS: A total of approximately 2 L of ascitic fluid was removed. Samples were sent to the laboratory as requested by the clinical team. IMPRESSION: Successful ultrasound-guided paracentesis yielding 2 liters of peritoneal fluid. Electronically Signed   By: Lavonia Dana M.D.   On: 10/26/2018 11:29   Roxan Hockey, MD  [facility]  If 7PM-7AM, please contact night-coverage [email or web address]  11/02/2018, 2:53 PM   LOS: 8 days

## 2018-11-03 LAB — RENAL FUNCTION PANEL
Albumin: 2.9 g/dL — ABNORMAL LOW (ref 3.5–5.0)
Anion gap: 11 (ref 5–15)
BUN: 44 mg/dL — ABNORMAL HIGH (ref 8–23)
CO2: 27 mmol/L (ref 22–32)
Calcium: 8.6 mg/dL — ABNORMAL LOW (ref 8.9–10.3)
Chloride: 101 mmol/L (ref 98–111)
Creatinine, Ser: 1.98 mg/dL — ABNORMAL HIGH (ref 0.44–1.00)
GFR calc Af Amer: 28 mL/min — ABNORMAL LOW (ref >60)
GFR calc non Af Amer: 24 mL/min — ABNORMAL LOW (ref >60)
Glucose, Bld: 105 mg/dL — ABNORMAL HIGH (ref 70–99)
Phosphorus: 2.8 mg/dL (ref 2.5–4.6)
Potassium: 3.8 mmol/L (ref 3.5–5.1)
Sodium: 139 mmol/L (ref 135–145)

## 2018-11-03 LAB — GLUCOSE, CAPILLARY
Glucose-Capillary: 101 mg/dL — ABNORMAL HIGH (ref 70–99)
Glucose-Capillary: 140 mg/dL — ABNORMAL HIGH (ref 70–99)
Glucose-Capillary: 124 mg/dL — ABNORMAL HIGH (ref 70–99)
Glucose-Capillary: 109 mg/dL — ABNORMAL HIGH (ref 70–99)

## 2018-11-03 MED ORDER — TORSEMIDE 20 MG PO TABS
60.0000 mg | ORAL_TABLET | Freq: Two times a day (BID) | ORAL | Status: DC
  Administered 2018-11-04 – 2018-11-05 (×3): 60 mg via ORAL
  Filled 2018-11-03 (×3): qty 3

## 2018-11-03 MED ORDER — POTASSIUM CHLORIDE CRYS ER 20 MEQ PO TBCR
20.0000 meq | EXTENDED_RELEASE_TABLET | Freq: Two times a day (BID) | ORAL | Status: AC
  Administered 2018-11-03 – 2018-11-04 (×4): 20 meq via ORAL
  Filled 2018-11-03 (×4): qty 1

## 2018-11-03 NOTE — Progress Notes (Signed)
PROGRESS NOTE  Jordan Le NLZ:[id number] DOB: 09-23-1945 DOA: 10/25/2018 PCP: Manon Hilding, MD  Brief History:  73 year old female with a history of diabetes mellitus, hypertension, liver cirrhosis, endometrial carcinoma status post hysterectomy February 2020 presented with 48-month history of shortness of breath and dyspnea on exertion as well as decreased oral intake.  She has also complained of increasing peripheral edema and increasing abdominal girth she states that she usually sleeps in a recliner, but is unable to tell me whether she truly has orthopnea or not because she states that she has never tried to lay flat in her life.  She denied any fevers, chills, headache, chest pain, coughing, hemoptysis, nausea, vomiting, diarrhea, abdominal pain.  Notably, the patient states that she was started on furosemide 20 mg daily in early April 2020.  Her dose of furosemide was subsequently increased to 40 mg daily in mid May 2020.  Approximately 3 weeks prior to this admission, spironolactone was added.  She denies any outpatient NSAID use.  She continues on losartan.  The patient states that she was told about 1 month prior to this admission that she had a problem with her kidneys, but this was not investigated further.  In the ED, the patient was afebrile hemodynamically stable saturating 94-96% room air.  Serum creatinine was 3.94.  WBC 9.5, hemoglobin 11.3, platelets 213,000.  CT of the abdomen pelvis showed a large left pleural effusion with left basilar atelectasis.  There was a shrunken nodular liver consistent with liver cirrhosis.  There was no bowel wall thickening.  There was significant ascites.  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 -10/26/18--paracentesis--2L removed -Hepatitis B surface antigen--neg -Hepatitis C antibody--neg -Antimitochondrial antibody negative -Anti-smooth muscle antibody negative -Alpha-1  antitrypsin level--normal -Alpha-fetoprotein pending -ANA negative -Initially furosemide and spironolactone were discontinued due to  AKI -10/26/18 paracentesis--3 L removed--> albumin given after paracentesis  -Completed ceftriaxone for SBP prophylaxis on 11/02/2018 -Still very volume overloaded,  -Urine output increased and her weight went down -Wt is down to 214 Lbs on 11/02/2018 from 228 on 10/26/2018 --Patient requiring very very high dose of IV Lasix  -Per nephrologist  Due to needing very high dose diuretics (Lasix iv 120 to 160 mg TID) and the need for IV albumin infusion to diurese, with significant risk for hemodynamic instability (especially given very soft BP and tachycardia at baseline) and risk of electrolyte disturbances with very very high-dose diuretics --- Pt needs to remain inpatient status to monitor blood pressure and electrolytes while aggressively diuresing the as noted above -Plan for possible transition to p.o. torsemide on 11/04/2018   Acute Kidney Injury -Likely due to hemodynamic changes in the setting of furosemide, spironolactone, losartan use -Urine sodium and creatinine <1 -Renal ultrasound negative for hydronephrosis -Nephrology consultation appreciated -urine protein/creatinine ratio--0.25 -Creatinine improved from a peak of  3.96 down to 1.98 Left pleural effusion --per Dr. Carmelina Dane overall small--defer Inocencio Homes for now -reassess for thora if respiratory status worsens again -echo shows normal EF  SVT/Atrial tachycardia -personally reviewed EKG--SVT, nonspecific T wave change -c/n po diltiazem -Echo shows normal EF -TSH 5.4  LLE Stasis Dermatitis -doubt cellulitis -present x 3 months -Completed ceftriaxone without change in skin color -Improving with diuresis --C/n Lorita Officer and ACE Wraps daily  Diabetes mellitus type 2 -10/26/18 Hemoglobin A1c--4.7--putting her at risk for hypoglycemia -NovoLog sliding scale -Discontinued  metformin -  Hypothyroidism -Continue Synthroid  Endometrial Carcinoma -Status post hysterectomy  05/07/2018 -follow up Dr. Denman George  Hyperlipidemia -continue statin  Disposition/Need for in-Hospital Stay- patient unable to be discharged at this time due to Pt needing very high dose diuretics (Lasix iv 120 to 160 mg TID) and albumin infusion to diurese, with significant risk for hemodynamic instability (especially given very soft BP and tachycardia at baseline) and risk of electrolyte disturbances with very very high-dose diuretics --- Pt needs to remain inpatient status to monitor blood pressure and electrolytes while aggressively diuresing the as noted above    Disposition Plan:   Needs iv Lasix and iv Albulmin, needs close monitoring of labs and blood pressure with aggressive very high dose diuretics  Family Communication:   Granddaughter updated peviously, patient is alert and oriented  Consultants:  GI, renal  Code Status:  FULL   DVT Prophylaxis:   [place] Lovenox  Procedures: As Listed in Progress Note Above  Antibiotics: Ceftriaxone 8/2>>>8/3   Subjective: No significant leg cramps, no shortness of breath at rest, does have dyspnea on exertion, --Volume overload, significant lower extremity edema and anasarca as well as ascites concerns persist--diuresing well with very high dose IV Lasix  Objective: Vitals:   11/02/18 2113 11/03/18 0012 11/03/18 0500 11/03/18 0504  BP: (!) 113/54 (!) 120/49  (!) 109/50  Pulse: (!) 118 97  97  Resp: 16   18  Temp: 98.5 F (36.9 C)   98.2 F (36.8 C)  TempSrc: Oral   Oral  SpO2: 96%   93%  Weight:   97.5 kg   Height:        Intake/Output Summary (Last 24 hours) at 11/03/2018 1607 Last data filed at 11/03/2018 1300 Gross per 24 hour  Intake 720 ml  Output 1250 ml  Net -530 ml   Weight change: 1 kg Exam:  General exam: Alert, awake, oriented x 3 Respiratory system: Improving air movement, some dyspnea on exertion  persist  cardiovascular system:RRR. No murmurs, rubs, gallops. Gastrointestinal system: Abdomen is distended, soft and nontender.   Normal bowel sounds heard. Central nervous system: Alert and oriented. No focal neurological deficits. Extremities: 3+ edema lower extremities bilaterally / Anasarca Skin: Venous stasis changes in lower extremities bilaterally  Psychiatry: Judgement and insight appear normal. Mood & affect appropriate.   Data Reviewed:   Basic Metabolic Panel: Recent Labs  Lab 10/30/18 0525 10/31/18 0551 11/01/18 0601 11/02/18 0515 11/03/18 0611  NA 138 140   140 135 138 139  K 4.3 3.9   3.9 3.5 3.8 3.8  CL 107 106   106 101 101 101  CO2 25 26   25 26 28 27   GLUCOSE 105* 90   92 95 100* 105*  BUN 58* 56*   56* 46* 46* 44*  CREATININE 2.47* 2.27*   2.24* 2.18* 1.95* 1.98*  CALCIUM 8.1* 8.3*   8.3* 8.0* 8.4* 8.6*  PHOS 3.3 3.1 3.1 3.1 2.8   Liver Function Tests: Recent Labs  Lab 10/28/18 0646 10/29/18 0510 10/30/18 0525 10/31/18 0551 11/01/18 0601 11/02/18 0515 11/03/18 0611  AST 21 18  --  19  --   --   --   ALT 9 10  --  10  --   --   --   ALKPHOS 48 28*  --  31*  --   --   --   BILITOT 2.3* 2.3*  --  1.9*  --   --   --   PROT 5.1* 5.3*  --  5.1*  --   --   --  ALBUMIN 2.4*   2.4* 3.0*   3.1* 2.8* 2.6*   2.6* 2.7* 2.9* 2.9*   No results for input(s): LIPASE, AMYLASE in the last 168 hours. No results for input(s): AMMONIA in the last 168 hours. Coagulation Profile: Recent Labs  Lab 10/29/18 0917  INR 2.0*   CBC: Recent Labs  Lab 10/29/18 0510 10/31/18 0551 11/02/18 0515  WBC 7.1 6.8 8.7  HGB 9.2* 8.8* 9.5*  HCT 28.2* 27.4* 28.8*  MCV 98.3 100.4* 99.0  PLT 129* 118* 128*   Cardiac Enzymes: No results for input(s): CKTOTAL, CKMB, CKMBINDEX, TROPONINI in the last 168 hours. BNP: Invalid input(s): POCBNP CBG: Recent Labs  Lab 11/02/18 1205 11/02/18 1624 11/02/18 2114 11/03/18 0731 11/03/18 1119  GLUCAP 158* 168* 129* 101* 140*    HbA1C: No results for input(s): HGBA1C in the last 72 hours. Urine analysis:    Component Value Date/Time   COLORURINE AMBER (A) 10/25/2018 2100   APPEARANCEUR CLOUDY (A) 10/25/2018 2100   LABSPEC 1.013 10/25/2018 2100   PHURINE 5.0 10/25/2018 2100   GLUCOSEU NEGATIVE 10/25/2018 2100   HGBUR SMALL (A) 10/25/2018 2100   [place] NEGATIVE 10/25/2018 2100   [place] NEGATIVE 10/25/2018 2100   PROTEINUR NEGATIVE 10/25/2018 2100   NITRITE NEGATIVE 10/25/2018 2100   LEUKOCYTESUR SMALL (A) 10/25/2018 2100   Sepsis Labs: @LABRCNTIP (procalcitonin:4,lacticidven:4) ) Recent Results (from the past 240 hour(s))  SARS CORONAVIRUS 2 Nasal Swab Aptima Multi Swab     Status: None   Collection Time: 10/25/18  5:23 PM   Specimen: Aptima Multi Swab; Nasal Swab  Result Value Ref Range Status   SARS Coronavirus 2 NEGATIVE NEGATIVE Final    Comment: (NOTE) SARS-CoV-2 target nucleic acids are NOT DETECTED. The SARS-CoV-2 RNA is generally detectable in upper and lower respiratory specimens during the acute phase of infection. Negative results do not preclude SARS-CoV-2 infection, do not rule out co-infections with other pathogens, and should not be used as the sole basis for treatment or other patient management decisions. Negative results must be combined with clinical observations, patient history, and epidemiological information. The expected result is Negative. Fact Sheet for Patients: SugarRoll.be Fact Sheet for Healthcare Providers: https://www.woods-mathews.com/ This test is not yet approved or cleared by the [place] FDA and  has been authorized for detection and/or diagnosis of SARS-CoV-2 by FDA under an Emergency Use Authorization (EUA). This EUA will remain  in effect (meaning this test can be used) for the duration of the COVID-19 declaration under Section 56 4(b)(1) of the Act, 21 U.S.C. section 360bbb-3(b)(1), unless the  authorization is terminated or revoked sooner. Performed at [facility], [place] [address] [place], [place] [address]   Urine culture     Status: None   Collection Time: 10/25/18  9:45 PM   Specimen: Urine, Catheterized  Result Value Ref Range Status   Specimen Description   Final    URINE, CATHETERIZED Performed at [facility], [address] [place], [place] [address]    Special Requests   Final    NONE Performed at [facility], [address], [place], [place] [address]    Culture   Final    NO GROWTH Performed at [facility], [place] [address] [place], [place] [address]    Report Status 10/27/2018 FINAL  Final  MRSA PCR Screening     Status: None   Collection Time: 10/25/18 11:17 PM   Specimen: Nasal Mucosa; Nasopharyngeal  Result Value Ref Range Status   MRSA by PCR  NEGATIVE NEGATIVE Final    Comment:        The GeneXpert MRSA Assay (FDA approved for NASAL specimens only), is one component of a comprehensive MRSA colonization surveillance program. It is not intended to diagnose MRSA infection nor to guide or monitor treatment for MRSA infections. Performed at [facility], [address], [place], [place] [address]   Culture, body fluid-bottle     Status: None   Collection Time: 10/26/18 10:26 AM   Specimen: Ascitic  Result Value Ref Range Status   Specimen Description ASCITIC  Final   Special Requests BOTTLES DRAWN AEROBIC AND ANAEROBIC 10 CC EACH  Final   Culture   Final    NO GROWTH 5 DAYS Performed at [facility], [address], [place], [place] [address]    Report Status 10/31/2018 FINAL  Final  Gram stain     Status: None   Collection Time: 10/26/18 10:26 AM   Specimen: Ascitic  Result Value Ref Range Status   Specimen Description ASCITIC  Final   Special Requests NONE  Final   Gram Stain   Final    NO ORGANISMS SEEN WBC PRESENT, PREDOMINANTLY MONONUCLEAR CYTOSPIN SMEAR Performed at [facility], [address] [place], [place]  [address]    Report Status 10/26/2018 FINAL  Final     Scheduled Meds:  aspirin EC  81 mg Oral QHS   atorvastatin  10 mg Oral q1800   Chlorhexidine Gluconate Cloth  6 each Topical Q0600   diltiazem  30 mg Oral Q6H   enoxaparin (LOVENOX) injection  30 mg Subcutaneous Q24H   feeding supplement (ENSURE ENLIVE)  237 mL Oral BID BM   insulin aspart  0-15 Units Subcutaneous TID WC   insulin aspart  0-5 Units Subcutaneous QHS   levothyroxine  75 mcg Oral QAC breakfast   multivitamin with minerals  1 tablet Oral Daily   nystatin   Topical TID   potassium chloride  20 mEq Oral BID   [START ON 11/04/2018] torsemide  60 mg Oral BID   Continuous Infusions:  albumin human 12.5 g (11/03/18 0959)   furosemide 120 mg (11/03/18 1434)    Procedures/Studies: Ct Abdomen Pelvis Wo Contrast  Result Date: 10/25/2018 CLINICAL DATA:  Acute renal injury, history of prior hysterectomy EXAM: CT ABDOMEN AND PELVIS WITHOUT CONTRAST TECHNIQUE: Multidetector CT imaging of the abdomen and pelvis was performed following the standard protocol without IV contrast. COMPARISON:  04/22/2018 FINDINGS: Lower chest: Large left pleural effusion is noted with underlying left basilar atelectasis. The right lung base is clear. Hepatobiliary: Shrunken liver with nodular contour consistent with underlying cirrhosis. No focal mass is noted. The gallbladder is well distended with dependent gallstones similar to that noted on the prior exam. Pancreas: Unremarkable. No pancreatic ductal dilatation or surrounding inflammatory changes. Spleen: Normal in size without focal abnormality. Adrenals/Urinary Tract: Adrenal glands are within normal limits. Small exophytic cyst is noted arising from the right kidney similar to that seen on prior exam. No renal calculi or obstructive changes are seen. The bladder is decompressed by Foley catheter. Stomach/Bowel: Appendix is not well visualized although no inflammatory changes are seen. No  obstructive or inflammatory changes of large or small bowel are seen. The stomach is within normal limits. Vascular/Lymphatic: Aortic atherosclerosis. No enlarged abdominal or pelvic lymph nodes. Reproductive: Uterus is been surgically removed consistent with the given clinical history. Other: Significant ascites is noted new from the prior exam. This may be related to the underlying portal hypertension.  Musculoskeletal: No acute or significant osseous findings. IMPRESSION: Changes of hepatic cirrhosis and significant ascites which is new from the prior exam. New moderate left-sided pleural effusion. Electronically Signed   By: Inez Catalina M.D.   On: 10/25/2018 21:34   Dg Chest 2 View  Result Date: 10/25/2018 CLINICAL DATA:  Shortness of breath EXAM: CHEST - 2 VIEW COMPARISON:  Chest CT April 22, 2018 FINDINGS: There is airspace consolidation in the left lower lobe with small left pleural effusion. The lungs elsewhere are clear. Heart size and pulmonary vascularity are normal. No adenopathy. No bone lesions. IMPRESSION: Left lower lobe airspace consolidation, felt to represent a degree of pneumonia. Small left pleural effusion. Lungs elsewhere clear. Cardiac silhouette normal. No adenopathy. Followup PA and lateral chest radiographs recommended in 3-4 weeks following trial of antibiotic therapy to ensure resolution and exclude underlying malignancy. Electronically Signed   By: Lowella Grip III M.D.   On: 10/25/2018 17:31   US Renal  Result Date: 10/26/2018 CLINICAL DATA:  Acute kidney injury EXAM: RENAL / URINARY TRACT ULTRASOUND COMPLETE COMPARISON:  CT abdomen pelvis October 25, 2018 FINDINGS: Right Kidney: Renal measurements: 11.3 x 4.9 x 7.1 cm. = volume: 206 mL. Echogenicity is normal. There is a small anechoic cyst seen within the midpole measuring 0.9 x 1.0 x 0.9 cm. Left Kidney: Renal measurements: 11.8 x 5.2 x 5.8 cm = volume: 187 mL. Echogenicity within normal limits. No mass or hydronephrosis  visualized. Bladder: Decompressed due to Foley catheter in place. Abdominopelvic ascites is seen. IMPRESSION: Small right midpole renal cyst.  Otherwise normal appearing kidneys. Electronically Signed   By: Prudencio Pair M.D.   On: 10/26/2018 11:49   US Paracentesis  Result Date: 10/26/2018 INDICATION: Cirrhosis, ascites EXAM: ULTRASOUND GUIDED DIAGNOSTIC AND THERAPEUTIC PARACENTESIS MEDICATIONS: None. COMPLICATIONS: None immediate. PROCEDURE: Procedure, benefits, and risks of procedure were discussed with patient. Written informed consent for procedure was obtained. Time out protocol followed. Adequate collection of ascites localized by ultrasound in RIGHT lower quadrant. Skin prepped and draped in usual sterile fashion. Skin and soft tissues anesthetized with 10 mL of 1% lidocaine. 5 [place] Yueh catheter placed into peritoneal cavity. 2 L of amber colored ascitic fluid aspirated by vacuum bottle suction. Volume removed limited to 2 L at request of ordering physician. Procedure tolerated well by patient without immediate complication. FINDINGS: A total of approximately 2 L of ascitic fluid was removed. Samples were sent to the laboratory as requested by the clinical team. IMPRESSION: Successful ultrasound-guided paracentesis yielding 2 liters of peritoneal fluid. Electronically Signed   By: Lavonia Dana M.D.   On: 10/26/2018 11:29   Roxan Hockey, MD  [facility]  If 7PM-7AM, please contact night-coverage [email or web address]  11/03/2018, 4:07 PM   LOS: 9 days

## 2018-11-03 NOTE — Progress Notes (Signed)
Patient ID: Jordan Le, female   DOB: September 16, 1945, 73 y.o.   MRN: [id number] S: 1800 UOP recorded, minus one liter, weight not down ?   - BUN and crt stable    O:BP (!) 109/50 (BP Location: Left Arm)   Pulse 97   Temp 98.2 F (36.8 C) (Oral)   Resp 18   Ht 5\' 3"  (1.6 m)   Wt 97.5 kg   SpO2 93%   BMI 38.08 kg/m   Intake/Output Summary (Last 24 hours) at 11/03/2018 0825 Last data filed at 11/02/2018 2224 Gross per 24 hour  Intake 785.9 ml  Output 1800 ml  Net -1014.1 ml   Intake/Output: I/O last 3 completed shifts: In: 1223.9 [P.O.:960; IV Piggyback:263.9] Out: 2900 [Urine:2900]  Intake/Output this shift:  No intake/output data recorded. Weight change: 1 kg Gen: NAD, sitting in chair  CVS: no rub Resp: decreased BS at bases Abd: distended, +BS, soft Ext:3+ anasarca  Recent Labs  Lab 10/28/18 0646 10/29/18 0510 10/30/18 0525 10/31/18 0551 11/01/18 0601 11/02/18 0515 11/03/18 0611  NA 139 138  138 138 140  140 135 138 139  K 4.4 4.2  4.1 4.3 3.9  3.9 3.5 3.8 3.8  CL 106 108  107 107 106  106 101 101 101  CO2 24 24  23 25 26  25 26 28 27   GLUCOSE 95 106*  107* 105* 90  92 95 100* 105*  BUN 66* 62*  61* 58* 56*  56* 46* 46* 44*  CREATININE 2.84* 2.58*  2.58* 2.47* 2.27*  2.24* 2.18* 1.95* 1.98*  ALBUMIN 2.4*  2.4* 3.0*  3.1* 2.8* 2.6*  2.6* 2.7* 2.9* 2.9*  CALCIUM 8.2* 8.2*  8.2* 8.1* 8.3*  8.3* 8.0* 8.4* 8.6*  PHOS 3.9 3.3 3.3 3.1 3.1 3.1 2.8  AST 21 18  --  19  --   --   --   ALT 9 10  --  10  --   --   --    Liver Function Tests: Recent Labs  Lab 10/28/18 0646 10/29/18 0510  10/31/18 0551 11/01/18 0601 11/02/18 0515 11/03/18 0611  AST 21 18  --  19  --   --   --   ALT 9 10  --  10  --   --   --   ALKPHOS 48 28*  --  31*  --   --   --   BILITOT 2.3* 2.3*  --  1.9*  --   --   --   PROT 5.1* 5.3*  --  5.1*  --   --   --   ALBUMIN 2.4*  2.4* 3.0*  3.1*   < > 2.6*  2.6* 2.7* 2.9* 2.9*   < > = values in this interval not displayed.    No results for input(s): LIPASE, AMYLASE in the last 168 hours. No results for input(s): AMMONIA in the last 168 hours. CBC: Recent Labs  Lab 10/29/18 0510 10/31/18 0551 11/02/18 0515  WBC 7.1 6.8 8.7  HGB 9.2* 8.8* 9.5*  HCT 28.2* 27.4* 28.8*  MCV 98.3 100.4* 99.0  PLT 129* 118* 128*   Cardiac Enzymes: No results for input(s): CKTOTAL, CKMB, CKMBINDEX, TROPONINI in the last 168 hours. CBG: Recent Labs  Lab 11/02/18 0755 11/02/18 1205 11/02/18 1624 11/02/18 2114 11/03/18 0731  GLUCAP 99 158* 168* 129* 101*    Iron Studies: No results for input(s): IRON, TIBC, TRANSFERRIN, FERRITIN in the last 72 hours. Studies/Results: No results found. Marland Kitchen  aspirin EC  81 mg Oral QHS  . atorvastatin  10 mg Oral q1800  . Chlorhexidine Gluconate Cloth  6 each Topical Q0600  . diltiazem  30 mg Oral Q6H  . enoxaparin (LOVENOX) injection  30 mg Subcutaneous Q24H  . feeding supplement (ENSURE ENLIVE)  237 mL Oral BID BM  . insulin aspart  0-15 Units Subcutaneous TID WC  . insulin aspart  0-5 Units Subcutaneous QHS  . levothyroxine  75 mcg Oral QAC breakfast  . multivitamin with minerals  1 tablet Oral Daily  . nystatin   Topical TID    BMET    Component Value Date/Time   NA 139 11/03/2018 0611   K 3.8 11/03/2018 0611   CL 101 11/03/2018 0611   CO2 27 11/03/2018 0611   GLUCOSE 105 (H) 11/03/2018 0611   BUN 44 (H) 11/03/2018 0611   CREATININE 1.98 (H) 11/03/2018 0611   CALCIUM 8.6 (L) 11/03/2018 0611   GFRNONAA 24 (L) 11/03/2018 0611   GFRAA 28 (L) 11/03/2018 0611   CBC    Component Value Date/Time   WBC 8.7 11/02/2018 0515   RBC 2.91 (L) 11/02/2018 0515   HGB 9.5 (L) 11/02/2018 0515   HCT 28.8 (L) 11/02/2018 0515   PLT 128 (L) 11/02/2018 0515   MCV 99.0 11/02/2018 0515   MCH 32.6 11/02/2018 0515   MCHC 33.0 11/02/2018 0515   RDW 15.1 11/02/2018 0515   LYMPHSABS 1.8 10/25/2018 1726   MONOABS 1.8 (H) 10/25/2018 1726   EOSABS 0.4 10/25/2018 1726   BASOSABS 0.1  10/25/2018 1726   Assessment/Plan:  1. AKI- in setting of decompensated cirrhosis with anasarca/ascites and intravascular volume depletion in setting of concomitant ARB therapy. Since stopping the ARB , her Cr has continued to improve from a peak 3.96 down to 1.98 today   1. Avoid NSAIDs/ACE/ARB or nephrotoxic agents such as IV contrast 2. Limit large volume paracenteses to 2 liters and administer albumin 3. continue to follow UOP and Scr  4. Sodium restriction 2 grams/day 5. Fluid restriction to 1.5 liters/day 2. Cirrhosis- new diagnosis presumably due to hepatic steatosis. Workup underway per GI. Hepatitis panel negative. Suspect NASH 3. Anasarca- restarted IV furosemide 80 mg 10/29/18 without significant diuresis. 1. Goal UF of 1-2 liters/day to help limit further ischemic injury related to hemodynamic shifts- have had trouble with this 2. Increased IV lasix to 160 mg tid  - added on some albumin as well - finally success but leg cramping and felt badly, maybe pushed too far- backed off a little to 120 q 8 hours- better but diuresis not as strong.  Pt can tell a difference.  Overall down 16 pounds so far but a ways to go 3. Due to needing very high dose diuretics and albumin infusion to diurese, in my opinion needs to remain inpatient status to monitor blood pressure and electrolytes 4. Eventual plan will be to see if can transition to oral diuretics with the same impact- will chose torsemide - 60 BID to start 8/12 and stop IV lasix at that time   4. Hyponatremia- hypervolemic. Improved 5. Anemia- normal iron stores. Unclear if she has had variceal bleeding. GI following. hgb trending down, not helping our oncotic situation- last seemed to be improving, no ESA this admit 6. Hypokalemia-  Repleted over weekend - will cont at 40 per day with ongoing diuresis   [place] [phone]

## 2018-11-03 NOTE — Progress Notes (Signed)
Dressing changed to right abd paracentesis site.  Small amount of serous drainage on old bandage.  Ambulated in hallway today with granddaughter.

## 2018-11-04 LAB — GLUCOSE, CAPILLARY
Glucose-Capillary: 104 mg/dL — ABNORMAL HIGH (ref 70–99)
Glucose-Capillary: 150 mg/dL — ABNORMAL HIGH (ref 70–99)
Glucose-Capillary: 102 mg/dL — ABNORMAL HIGH (ref 70–99)
Glucose-Capillary: 160 mg/dL — ABNORMAL HIGH (ref 70–99)

## 2018-11-04 LAB — RENAL FUNCTION PANEL
Albumin: 2.9 g/dL — ABNORMAL LOW (ref 3.5–5.0)
Anion gap: 9 (ref 5–15)
BUN: 41 mg/dL — ABNORMAL HIGH (ref 8–23)
CO2: 29 mmol/L (ref 22–32)
Calcium: 8.3 mg/dL — ABNORMAL LOW (ref 8.9–10.3)
Chloride: 98 mmol/L (ref 98–111)
Creatinine, Ser: 1.86 mg/dL — ABNORMAL HIGH (ref 0.44–1.00)
GFR calc Af Amer: 30 mL/min — ABNORMAL LOW (ref >60)
GFR calc non Af Amer: 26 mL/min — ABNORMAL LOW (ref >60)
Glucose, Bld: 102 mg/dL — ABNORMAL HIGH (ref 70–99)
Phosphorus: 2.8 mg/dL (ref 2.5–4.6)
Potassium: 3.6 mmol/L (ref 3.5–5.1)
Sodium: 136 mmol/L (ref 135–145)

## 2018-11-04 LAB — MAGNESIUM: Magnesium: 1.8 mg/dL (ref 1.7–2.4)

## 2018-11-04 MED ORDER — BISACODYL 10 MG RE SUPP
10.0000 mg | Freq: Every evening | RECTAL | Status: DC | PRN
  Administered 2018-11-04 (×3): 10 mg via RECTAL
  Filled 2018-11-04 (×2): qty 1

## 2018-11-04 MED ORDER — METOPROLOL TARTRATE 5 MG/5ML IV SOLN
5.0000 mg | Freq: Once | INTRAVENOUS | Status: AC
  Administered 2018-11-04: 5 mg via INTRAVENOUS
  Filled 2018-11-04: qty 5

## 2018-11-04 MED ORDER — POTASSIUM CHLORIDE CRYS ER 20 MEQ PO TBCR
40.0000 meq | EXTENDED_RELEASE_TABLET | Freq: Every day | ORAL | Status: DC
  Administered 2018-11-05: 40 meq via ORAL
  Filled 2018-11-04 (×2): qty 2

## 2018-11-04 MED ORDER — LACTULOSE 10 GM/15ML PO SOLN
30.0000 g | Freq: Once | ORAL | Status: AC
  Administered 2018-11-04: 30 g via ORAL
  Filled 2018-11-04: qty 60

## 2018-11-04 NOTE — Progress Notes (Signed)
Patient ID: Jordan Le, female   DOB: [birth year], 73 y.o.   MRN: [id number] [facility] Progress Note   Assessment/ Plan:   1.  Acute kidney injury:  In the setting of anasarca/ascites with intravascular volume depletion and previous ARB therapy likely culminating in ischemic ATN.  Creatinine continues to trend downwards after holding ARB and with adjustment of diuretic therapy.  Transitioned today to oral torsemide with the goal of trying to get her to a reliable outpatient regimen.  Avoid nonsteroidal anti-inflammatory drugs and iodinated intravenous contrast unless emergently needed.  Recommend limiting paracentesis volume/covering with intravenous albumin.  Will begin low-dose potassium as it is desirable to avoid hypokalemia (reduce hepatic encephalopathy risk). 2.  New onset cirrhosis: Suspected to be from hepatic steatosis with history of type 2 diabetes mellitus/obesity.    Previous testing for viral hepatitis and autoimmune hepatitis have been negative.  Weight trending down with ongoing diuresis. 3.  Hyponatremia: Mild and secondary to cirrhosis/AKI.    Stable on diuretics. 4.  Anemia: Possibly secondary to chronic disease, iron stores replete and without indications for PRBC transfusion.  Subjective:   Reports to be feeling somewhat better, appetite still poor.  Emphatically reports that she walked around hallways yesterday.   Objective:   BP (!) 91/43 (BP Location: Left Arm)   Pulse (!) 113   Temp 98 F (36.7 C) (Oral)   Resp 18   Ht 5\' 3"  (1.6 m)   Wt 96.6 kg   SpO2 93%   BMI 37.72 kg/m   Intake/Output Summary (Last 24 hours) at 11/04/2018 0849 Last data filed at 11/04/2018 6378 Gross per 24 hour  Intake 720 ml  Output 2550 ml  Net -1830 ml   Weight change: -0.9 kg  Physical Exam: Gen: Comfortably resting in bed CVS: Pulse regular tachycardia, S1 and S2 with ejection systolic murmur Resp: Diminished breath sounds over bases-poor inspiratory effort.  No  anterior rales or rhonchi Abd: Soft, obese, nontender Ext: 3+ anasarca.  Lower extremities in wraps.  Imaging: No results found.  Labs: BMET Recent Labs  Lab 10/29/18 0510 10/30/18 0525 10/31/18 0551 11/01/18 0601 11/02/18 0515 11/03/18 0611 11/04/18 0448  NA 138  138 138 140  140 135 138 139 136  K 4.2  4.1 4.3 3.9  3.9 3.5 3.8 3.8 3.6  CL 108  107 107 106  106 101 101 101 98  CO2 24  23 25 26  25 26 28 27 29   GLUCOSE 106*  107* 105* 90  92 95 100* 105* 102*  BUN 62*  61* 58* 56*  56* 46* 46* 44* 41*  CREATININE 2.58*  2.58* 2.47* 2.27*  2.24* 2.18* 1.95* 1.98* 1.86*  CALCIUM 8.2*  8.2* 8.1* 8.3*  8.3* 8.0* 8.4* 8.6* 8.3*  PHOS 3.3 3.3 3.1 3.1 3.1 2.8 2.8   CBC Recent Labs  Lab 10/29/18 0510 10/31/18 0551 11/02/18 0515  WBC 7.1 6.8 8.7  HGB 9.2* 8.8* 9.5*  HCT 28.2* 27.4* 28.8*  MCV 98.3 100.4* 99.0  PLT 129* 118* 128*    Medications:    . aspirin EC  81 mg Oral QHS  . atorvastatin  10 mg Oral q1800  . diltiazem  30 mg Oral Q6H  . enoxaparin (LOVENOX) injection  30 mg Subcutaneous Q24H  . feeding supplement (ENSURE ENLIVE)  237 mL Oral BID BM  . insulin aspart  0-15 Units Subcutaneous TID WC  . insulin aspart  0-5 Units Subcutaneous QHS  . levothyroxine  75 mcg Oral QAC breakfast  . multivitamin with minerals  1 tablet Oral Daily  . nystatin   Topical TID  . potassium chloride  20 mEq Oral BID  . torsemide  60 mg Oral BID   Elmarie Shiley, MD 11/04/2018, 8:49 AM

## 2018-11-04 NOTE — Progress Notes (Signed)
On call provider notified of patient HR in 170s.

## 2018-11-04 NOTE — Care Management Important Message (Signed)
Important Message  Patient Details  Name: Jordan Le MRN: [id number] Date of Birth: [birth year]   Medicare Important Message Given:  Yes     [name] 11/04/2018, 1:59 PM

## 2018-11-04 NOTE — Progress Notes (Signed)
PROGRESS NOTE  Jordan Le BSJ:[id number] DOB: 10/19/1945 DOA: 10/25/2018 PCP: Manon Hilding, MD  Brief History:  73 year old female with a history of diabetes mellitus, hypertension, liver cirrhosis, endometrial carcinoma status post hysterectomy February 2020 presented with 38-month history of shortness of breath and dyspnea on exertion as well as decreased oral intake.  She has also complained of increasing peripheral edema and increasing abdominal girth she states that she usually sleeps in a recliner, but is unable to tell me whether she truly has orthopnea or not because she states that she has never tried to lay flat in her life.  She denied any fevers, chills, headache, chest pain, coughing, hemoptysis, nausea, vomiting, diarrhea, abdominal pain.  Notably, the patient states that she was started on furosemide 20 mg daily in early April 2020.  Her dose of furosemide was subsequently increased to 40 mg daily in mid May 2020.  Approximately 3 weeks prior to this admission, spironolactone was added.  She denies any outpatient NSAID use.  She continues on losartan.  The patient states that she was told about 1 month prior to this admission that she had a problem with her kidneys, but this was not investigated further.  In the ED, the patient was afebrile hemodynamically stable saturating 94-96% room air.  Serum creatinine was 3.94.  WBC 9.5, hemoglobin 11.3, platelets 213,000.  CT of the abdomen pelvis showed a large left pleural effusion with left basilar atelectasis.  There was a shrunken nodular liver consistent with liver cirrhosis.  There was no bowel wall thickening.  There was significant ascites.  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 -10/26/18--paracentesis--2L removed -Hepatitis B surface antigen--neg -Hepatitis C antibody--neg -Antimitochondrial antibody negative -Anti-smooth muscle antibody negative -Alpha-1  antitrypsin level--normal -Alpha-fetoprotein pending -ANA negative -Initially furosemide and spironolactone were discontinued due to  AKI -10/26/18 paracentesis--3 L removed--> albumin given after paracentesis  -Completed ceftriaxone for SBP prophylaxis on 11/02/2018 -Still very volume overloaded,  -Urine output increased and her weight went down -Wt is down to 212 Lbs on 11/02/2018 from 228 on 10/26/2018 --  -Per nephrologist   -  transition to p.o. torsemide on 11/04/2018 if patient tolerates well then possible discharge home on 11/05/2018 on oral torsemide  Acute Kidney Injury -Likely due to hemodynamic changes in the setting of furosemide, spironolactone, losartan use -Urine sodium and creatinine <1 -Renal ultrasound negative for hydronephrosis -Nephrology consultation appreciated -urine protein/creatinine ratio--0.25 -Creatinine improved from a peak of  3.96 down to 1.86 Left pleural effusion --per Dr. Carmelina Dane overall small--defer Inocencio Homes for now -reassess for thora if respiratory status worsens again -echo shows normal EF  SVT/Atrial tachycardia -personally reviewed EKG--SVT, nonspecific T wave change -c/n po diltiazem -Echo shows normal EF -TSH 5.4  LLE Stasis Dermatitis -doubt cellulitis -present x 3 months -Completed ceftriaxone without change in skin color -Improving with diuresis --C/n Lorita Officer and ACE Wraps daily  Diabetes mellitus type 2 -10/26/18 Hemoglobin A1c--4.7--putting her at risk for hypoglycemia -NovoLog sliding scale -Discontinued metformin -  Hypothyroidism -Continue Synthroid  Endometrial Carcinoma -Status post hysterectomy 05/07/2018 -follow up Dr. Denman George  Hyperlipidemia -continue statin  Disposition/Need for in-Hospital Stay- patient unable to be discharged at this time due to Transitioned today to oral torsemide on 11/04/2018 with the goal of trying to get her to a reliable outpatient regimen -if patient tolerates well then possible  discharge home on 11/05/2018 on oral torsemide  Family Communication:   Granddaughter updated  peviously, patient is alert and oriented  Consultants:  GI, renal  Code Status:  FULL   DVT Prophylaxis:   Learned Lovenox  Procedures: As Listed in Progress Note Above  Antibiotics: Ceftriaxone 8/2>>>8/3   Subjective: Voiding well, no shortness of breath at rest, Exertion is improving, still has significant lower extremity edema, coming off IV Lasix today, starting oral torsemide today if patient tolerates well then possible discharge home on 11/05/2018 on oral torsemide  Objective: Vitals:   11/03/18 2141 11/04/18 0413 11/04/18 0510 11/04/18 1257  BP: (!) 101/51  (!) 91/43 (!) 91/44  Pulse: 92  (!) 113 (!) 117  Resp: 18  18   Temp: 98.4 F (36.9 C)  98 F (36.7 C)   TempSrc: Oral  Oral   SpO2: 96%  93%   Weight:  96.6 kg    Height:        Intake/Output Summary (Last 24 hours) at 11/04/2018 1524 Last data filed at 11/04/2018 1300 Gross per 24 hour  Intake 720 ml  Output 3400 ml  Net -2680 ml   Weight change: -0.9 kg Exam:  General exam: Alert, awake, oriented x 3 Respiratory system: Improving air movement, some dyspnea on exertion persist  cardiovascular system:RRR. No murmurs, rubs, gallops. Gastrointestinal system: Abdomen is distended, soft and nontender.   Normal bowel sounds heard. Central nervous system: Alert and oriented. No focal neurological deficits. Extremities: 3+ edema lower extremities bilaterally / Anasarca Skin: Venous stasis changes in lower extremities bilaterally  Psychiatry: Judgement and insight appear normal. Mood & affect appropriate.   Data Reviewed:   Basic Metabolic Panel: Recent Labs  Lab 10/31/18 0551 11/01/18 0601 11/02/18 0515 11/03/18 0611 11/04/18 0448  NA 140   140 135 138 139 136  K 3.9   3.9 3.5 3.8 3.8 3.6  CL 106   106 101 101 101 98  CO2 26   25 26 28 27 29   GLUCOSE 90   92 95 100* 105* 102*  BUN 56*   56* 46* 46* 44*  41*  CREATININE 2.27*   2.24* 2.18* 1.95* 1.98* 1.86*  CALCIUM 8.3*   8.3* 8.0* 8.4* 8.6* 8.3*  MG  --   --   --   --  1.8  PHOS 3.1 3.1 3.1 2.8 2.8   Liver Function Tests: Recent Labs  Lab 10/29/18 0510  10/31/18 0551 11/01/18 0601 11/02/18 0515 11/03/18 0611 11/04/18 0448  AST 18  --  19  --   --   --   --   ALT 10  --  10  --   --   --   --   ALKPHOS 28*  --  31*  --   --   --   --   BILITOT 2.3*  --  1.9*  --   --   --   --   PROT 5.3*  --  5.1*  --   --   --   --   ALBUMIN 3.0*   3.1*   < > 2.6*   2.6* 2.7* 2.9* 2.9* 2.9*   < > = values in this interval not displayed.   No results for input(s): LIPASE, AMYLASE in the last 168 hours. No results for input(s): AMMONIA in the last 168 hours. Coagulation Profile: Recent Labs  Lab 10/29/18 0917  INR 2.0*   CBC: Recent Labs  Lab 10/29/18 0510 10/31/18 0551 11/02/18 0515  WBC 7.1 6.8 8.7  HGB 9.2* 8.8* 9.5*  HCT 28.2* 27.4*  28.8*  MCV 98.3 100.4* 99.0  PLT 129* 118* 128*   Cardiac Enzymes: No results for input(s): CKTOTAL, CKMB, CKMBINDEX, TROPONINI in the last 168 hours. BNP: Invalid input(s): POCBNP CBG: Recent Labs  Lab 11/03/18 1119 11/03/18 1615 11/03/18 2141 11/04/18 0736 11/04/18 1115  GLUCAP 140* 124* 109* 104* 150*   HbA1C: No results for input(s): HGBA1C in the last 72 hours. Urine analysis:    Component Value Date/Time   COLORURINE AMBER (A) 10/25/2018 2100   APPEARANCEUR CLOUDY (A) 10/25/2018 2100   LABSPEC 1.013 10/25/2018 2100   PHURINE 5.0 10/25/2018 2100   GLUCOSEU NEGATIVE 10/25/2018 2100   HGBUR SMALL (A) 10/25/2018 2100   [place] NEGATIVE 10/25/2018 2100   [place] NEGATIVE 10/25/2018 2100   PROTEINUR NEGATIVE 10/25/2018 2100   NITRITE NEGATIVE 10/25/2018 2100   LEUKOCYTESUR SMALL (A) 10/25/2018 2100   Sepsis Labs: @LABRCNTIP (procalcitonin:4,lacticidven:4) ) Recent Results (from the past 240 hour(s))  SARS CORONAVIRUS 2 Nasal Swab Aptima Multi Swab     Status: None    Collection Time: 10/25/18  5:23 PM   Specimen: Aptima Multi Swab; Nasal Swab  Result Value Ref Range Status   SARS Coronavirus 2 NEGATIVE NEGATIVE Final    Comment: (NOTE) SARS-CoV-2 target nucleic acids are NOT DETECTED. The SARS-CoV-2 RNA is generally detectable in upper and lower respiratory specimens during the acute phase of infection. Negative results do not preclude SARS-CoV-2 infection, do not rule out co-infections with other pathogens, and should not be used as the sole basis for treatment or other patient management decisions. Negative results must be combined with clinical observations, patient history, and epidemiological information. The expected result is Negative. Fact Sheet for Patients: SugarRoll.be Fact Sheet for Healthcare Providers: https://www.woods-mathews.com/ This test is not yet approved or cleared by the [place] FDA and  has been authorized for detection and/or diagnosis of SARS-CoV-2 by FDA under an Emergency Use Authorization (EUA). This EUA will remain  in effect (meaning this test can be used) for the duration of the COVID-19 declaration under Section 56 4(b)(1) of the Act, 21 U.S.C. section 360bbb-3(b)(1), unless the authorization is terminated or revoked sooner. Performed at [facility], [place] [address], [place], [place] [address]   Urine culture     Status: None   Collection Time: 10/25/18  9:45 PM   Specimen: Urine, Catheterized  Result Value Ref Range Status   Specimen Description   Final    URINE, CATHETERIZED Performed at [facility], [address], [place], [place] [address]    Special Requests   Final    NONE Performed at [facility], [address], [place], [place] [address]    Culture   Final    NO GROWTH Performed at [facility], [place] [address], [place], [place] [address]    Report Status 10/27/2018 FINAL  Final  MRSA PCR Screening     Status: None   Collection Time:  10/25/18 11:17 PM   Specimen: Nasal Mucosa; Nasopharyngeal  Result Value Ref Range Status   MRSA by PCR NEGATIVE NEGATIVE Final    Comment:        The GeneXpert MRSA Assay (FDA approved for NASAL specimens only), is one component of a comprehensive MRSA colonization surveillance program. It is not intended to diagnose MRSA infection nor to guide or monitor treatment for MRSA infections. Performed at [facility], [address] [place], [place] [address]   Culture, body fluid-bottle     Status: None   Collection Time: 10/26/18  10:26 AM   Specimen: Ascitic  Result Value Ref Range Status   Specimen Description ASCITIC  Final   Special Requests BOTTLES DRAWN AEROBIC AND ANAEROBIC 10 CC EACH  Final   Culture   Final    NO GROWTH 5 DAYS Performed at [facility], [address] [place], [place] [address]    Report Status 10/31/2018 FINAL  Final  Gram stain     Status: None   Collection Time: 10/26/18 10:26 AM   Specimen: Ascitic  Result Value Ref Range Status   Specimen Description ASCITIC  Final   Special Requests NONE  Final   Gram Stain   Final    NO ORGANISMS SEEN WBC PRESENT, PREDOMINANTLY MONONUCLEAR CYTOSPIN SMEAR Performed at [facility], [address] [place], [place] [address]    Report Status 10/26/2018 FINAL  Final     Scheduled Meds:  aspirin EC  81 mg Oral QHS   atorvastatin  10 mg Oral q1800   diltiazem  30 mg Oral Q6H   enoxaparin (LOVENOX) injection  30 mg Subcutaneous Q24H   feeding supplement (ENSURE ENLIVE)  237 mL Oral BID BM   insulin aspart  0-15 Units Subcutaneous TID WC   insulin aspart  0-5 Units Subcutaneous QHS   levothyroxine  75 mcg Oral QAC breakfast   multivitamin with minerals  1 tablet Oral Daily   nystatin   Topical TID   potassium chloride  20 mEq Oral BID   [START ON 11/05/2018] potassium chloride  40 mEq Oral Daily   torsemide  60 mg Oral BID   Continuous Infusions:  albumin human 12.5 g (11/04/18 1024)    furosemide 120 mg (11/04/18 1247)    Procedures/Studies: Ct Abdomen Pelvis Wo Contrast  Result Date: 10/25/2018 CLINICAL DATA:  Acute renal injury, history of prior hysterectomy EXAM: CT ABDOMEN AND PELVIS WITHOUT CONTRAST TECHNIQUE: Multidetector CT imaging of the abdomen and pelvis was performed following the standard protocol without IV contrast. COMPARISON:  04/22/2018 FINDINGS: Lower chest: Large left pleural effusion is noted with underlying left basilar atelectasis. The right lung base is clear. Hepatobiliary: Shrunken liver with nodular contour consistent with underlying cirrhosis. No focal mass is noted. The gallbladder is well distended with dependent gallstones similar to that noted on the prior exam. Pancreas: Unremarkable. No pancreatic ductal dilatation or surrounding inflammatory changes. Spleen: Normal in size without focal abnormality. Adrenals/Urinary Tract: Adrenal glands are within normal limits. Small exophytic cyst is noted arising from the right kidney similar to that seen on prior exam. No renal calculi or obstructive changes are seen. The bladder is decompressed by Foley catheter. Stomach/Bowel: Appendix is not well visualized although no inflammatory changes are seen. No obstructive or inflammatory changes of large or small bowel are seen. The stomach is within normal limits. Vascular/Lymphatic: Aortic atherosclerosis. No enlarged abdominal or pelvic lymph nodes. Reproductive: Uterus is been surgically removed consistent with the given clinical history. Other: Significant ascites is noted new from the prior exam. This may be related to the underlying portal hypertension. Musculoskeletal: No acute or significant osseous findings. IMPRESSION: Changes of hepatic cirrhosis and significant ascites which is new from the prior exam. New moderate left-sided pleural effusion. Electronically Signed   By: Inez Catalina M.D.   On: 10/25/2018 21:34   Dg Chest 2 View  Result Date:  10/25/2018 CLINICAL DATA:  Shortness of breath EXAM: CHEST - 2 VIEW COMPARISON:  Chest CT April 22, 2018 FINDINGS: There is airspace consolidation in the left lower lobe  with small left pleural effusion. The lungs elsewhere are clear. Heart size and pulmonary vascularity are normal. No adenopathy. No bone lesions. IMPRESSION: Left lower lobe airspace consolidation, felt to represent a degree of pneumonia. Small left pleural effusion. Lungs elsewhere clear. Cardiac silhouette normal. No adenopathy. Followup PA and lateral chest radiographs recommended in 3-4 weeks following trial of antibiotic therapy to ensure resolution and exclude underlying malignancy. Electronically Signed   By: Lowella Grip III M.D.   On: 10/25/2018 17:31   US Renal  Result Date: 10/26/2018 CLINICAL DATA:  Acute kidney injury EXAM: RENAL / URINARY TRACT ULTRASOUND COMPLETE COMPARISON:  CT abdomen pelvis October 25, 2018 FINDINGS: Right Kidney: Renal measurements: 11.3 x 4.9 x 7.1 cm. = volume: 206 mL. Echogenicity is normal. There is a small anechoic cyst seen within the midpole measuring 0.9 x 1.0 x 0.9 cm. Left Kidney: Renal measurements: 11.8 x 5.2 x 5.8 cm = volume: 187 mL. Echogenicity within normal limits. No mass or hydronephrosis visualized. Bladder: Decompressed due to Foley catheter in place. Abdominopelvic ascites is seen. IMPRESSION: Small right midpole renal cyst.  Otherwise normal appearing kidneys. Electronically Signed   By: Prudencio Pair M.D.   On: 10/26/2018 11:49   US Paracentesis  Result Date: 10/26/2018 INDICATION: Cirrhosis, ascites EXAM: ULTRASOUND GUIDED DIAGNOSTIC AND THERAPEUTIC PARACENTESIS MEDICATIONS: None. COMPLICATIONS: None immediate. PROCEDURE: Procedure, benefits, and risks of procedure were discussed with patient. Written informed consent for procedure was obtained. Time out protocol followed. Adequate collection of ascites localized by ultrasound in RIGHT lower quadrant. Skin prepped and draped in  usual sterile fashion. Skin and soft tissues anesthetized with 10 mL of 1% lidocaine. 5 [place] Yueh catheter placed into peritoneal cavity. 2 L of amber colored ascitic fluid aspirated by vacuum bottle suction. Volume removed limited to 2 L at request of ordering physician. Procedure tolerated well by patient without immediate complication. FINDINGS: A total of approximately 2 L of ascitic fluid was removed. Samples were sent to the laboratory as requested by the clinical team. IMPRESSION: Successful ultrasound-guided paracentesis yielding 2 liters of peritoneal fluid. Electronically Signed   By: Lavonia Dana M.D.   On: 10/26/2018 11:29   Roxan Hockey, MD  [facility]  If 7PM-7AM, please contact night-coverage [email or web address]  11/04/2018, 3:24 PM   LOS: 10 days

## 2018-11-05 DIAGNOSIS — R0602 Shortness of breath: Secondary | ICD-10-CM

## 2018-11-05 LAB — RENAL FUNCTION PANEL
Albumin: 3.3 g/dL — ABNORMAL LOW (ref 3.5–5.0)
Anion gap: 10 (ref 5–15)
BUN: 44 mg/dL — ABNORMAL HIGH (ref 8–23)
CO2: 28 mmol/L (ref 22–32)
Calcium: 8.6 mg/dL — ABNORMAL LOW (ref 8.9–10.3)
Chloride: 99 mmol/L (ref 98–111)
Creatinine, Ser: 2.09 mg/dL — ABNORMAL HIGH (ref 0.44–1.00)
GFR calc Af Amer: 26 mL/min — ABNORMAL LOW (ref >60)
GFR calc non Af Amer: 23 mL/min — ABNORMAL LOW (ref >60)
Glucose, Bld: 158 mg/dL — ABNORMAL HIGH (ref 70–99)
Phosphorus: 2.6 mg/dL (ref 2.5–4.6)
Potassium: 4.1 mmol/L (ref 3.5–5.1)
Sodium: 137 mmol/L (ref 135–145)

## 2018-11-05 LAB — CBC
HCT: 28.5 % — ABNORMAL LOW (ref 36.0–46.0)
Hemoglobin: 9.1 g/dL — ABNORMAL LOW (ref 12.0–15.0)
MCH: 31.5 pg (ref 26.0–34.0)
MCHC: 31.9 g/dL (ref 30.0–36.0)
MCV: 98.6 fL (ref 80.0–100.0)
Platelets: 152 10*3/uL (ref 150–400)
RBC: 2.89 MIL/uL — ABNORMAL LOW (ref 3.87–5.11)
RDW: 15.1 % (ref 11.5–15.5)
WBC: 13.1 10*3/uL — ABNORMAL HIGH (ref 4.0–10.5)
nRBC: 0 % (ref 0.0–0.2)

## 2018-11-05 LAB — GLUCOSE, CAPILLARY
Glucose-Capillary: 137 mg/dL — ABNORMAL HIGH (ref 70–99)
Glucose-Capillary: 197 mg/dL — ABNORMAL HIGH (ref 70–99)

## 2018-11-05 LAB — MAGNESIUM: Magnesium: 1.8 mg/dL (ref 1.7–2.4)

## 2018-11-05 MED ORDER — ATORVASTATIN CALCIUM 10 MG PO TABS: 10.0000 mg | ORAL_TABLET | Freq: Every day | ORAL | 2 refills | Status: DC

## 2018-11-05 MED ORDER — TORSEMIDE 20 MG PO TABS: 60.0000 mg | ORAL_TABLET | Freq: Two times a day (BID) | ORAL | 2 refills | Status: DC

## 2018-11-05 MED ORDER — SPIRONOLACTONE 25 MG PO TABS: 25.0000 mg | ORAL_TABLET | Freq: Every day | ORAL | 6 refills | Status: DC

## 2018-11-05 MED ORDER — ADULT MULTIVITAMIN W/MINERALS CH: 1.0000 | ORAL_TABLET | Freq: Every day | ORAL | 2 refills | Status: AC

## 2018-11-05 MED ORDER — DILTIAZEM HCL ER COATED BEADS 120 MG PO CP24: 120.0000 mg | ORAL_CAPSULE | Freq: Every day | ORAL | 11 refills | Status: DC

## 2018-11-05 MED ORDER — ENSURE ENLIVE PO LIQD: 237.0000 mL | Freq: Two times a day (BID) | ORAL | 12 refills | Status: AC

## 2018-11-05 MED ORDER — ASPIRIN EC 81 MG PO TBEC: 81.0000 mg | DELAYED_RELEASE_TABLET | Freq: Every day | ORAL | 11 refills | Status: DC

## 2018-11-05 MED ORDER — NYSTATIN 100000 UNIT/GM EX POWD: Freq: Three times a day (TID) | CUTANEOUS | 0 refills | Status: DC

## 2018-11-05 NOTE — Plan of Care (Signed)

## 2018-11-05 NOTE — Progress Notes (Addendum)
Patient ID: Jordan Le, female   DOB: 02-Nov-1945, 73 y.o.   MRN: [id number]  [facility] Progress Note   Assessment/ Plan:   1.  Acute kidney injury:  In the setting of anasarca/ascites with intravascular volume depletion and previous ARB therapy likely culminating in ischemic ATN.  Renal function slightly worse this morning but overall GFR stable over the past 48 hours or so.  Decent urine output following conversion to oral torsemide from intravenous furosemide without weights charted today.  She continues to feel clinically better and once her tachycardia has been evaluated, may potentially be able to go home with outpatient follow-up. 2.  New onset cirrhosis: Suspected to be from hepatic steatosis with history of type 2 diabetes mellitus/obesity.  Negative testing for viral hepatitis and autoimmune hepatitis.  Anasarca persists without significant abdominal distention/ascites to prompt paracentesis.  When GFR rises to >30 mL/minute, begin spironolactone and discontinue potassium supplement. 3. Tachycardia: Initial EKG shows sinus tachycardia and this will be reevaluated again with follow-up EKG this morning as well as CBC to verify that she does not have acute hemoglobin drop.  Started yesterday on diltiazem for rate control.  Maintain low threshold for PE evaluation in the setting of hypoalbuminemia, relative immobility and no prophylactic anticoagulation. 4.  Anemia: Possibly secondary to chronic disease, iron stores replete and without indications for PRBC transfusion.  Subjective:   Reports to have slept in a recliner overnight and had excellent bowel movement.  Noted to be tachycardic overnight with mild relative hypotension (EKG shows sinus tachycardia).   Objective:   BP (!) 107/54 (BP Location: Left Arm)   Pulse (!) 123   Temp 98.8 F (37.1 C) (Oral)   Resp 18   Ht 5\' 3"  (1.6 m)   Wt 96.6 kg   SpO2 100%   BMI 37.72 kg/m   Intake/Output Summary (Last 24 hours) at  11/05/2018 0853 Last data filed at 11/04/2018 1700 Gross per 24 hour  Intake 820 ml  Output 1700 ml  Net -880 ml   Weight change:   Physical Exam: Gen: Comfortably sitting up in recliner, about to start breakfast CVS: Pulse regular tachycardia, S1 and S2 with ejection systolic murmur Resp: Diminished breath sounds over bases-poor inspiratory effort.  No anterior rales or rhonchi Abd: Soft, obese, nontender Ext: 3+ anasarca.  Lower extremities in ACE wraps.  Imaging: No results found.  Labs: BMET Recent Labs  Lab 10/30/18 0525 10/31/18 0551 11/01/18 0601 11/02/18 0515 11/03/18 0611 11/04/18 0448 11/05/18 0450  NA 138 140  140 135 138 139 136 137  K 4.3 3.9  3.9 3.5 3.8 3.8 3.6 4.1  CL 107 106  106 101 101 101 98 99  CO2 25 26  25 26 28 27 29 28   GLUCOSE 105* 90  92 95 100* 105* 102* 158*  BUN 58* 56*  56* 46* 46* 44* 41* 44*  CREATININE 2.47* 2.27*  2.24* 2.18* 1.95* 1.98* 1.86* 2.09*  CALCIUM 8.1* 8.3*  8.3* 8.0* 8.4* 8.6* 8.3* 8.6*  PHOS 3.3 3.1 3.1 3.1 2.8 2.8 2.6   CBC Recent Labs  Lab 10/31/18 0551 11/02/18 0515  WBC 6.8 8.7  HGB 8.8* 9.5*  HCT 27.4* 28.8*  MCV 100.4* 99.0  PLT 118* 128*    Medications:    . aspirin EC  81 mg Oral QHS  . atorvastatin  10 mg Oral q1800  . bisacodyl  10 mg Rectal QHS,MR X 1  . diltiazem  30 mg Oral Q6H  .  enoxaparin (LOVENOX) injection  30 mg Subcutaneous Q24H  . feeding supplement (ENSURE ENLIVE)  237 mL Oral BID BM  . insulin aspart  0-15 Units Subcutaneous TID WC  . insulin aspart  0-5 Units Subcutaneous QHS  . levothyroxine  75 mcg Oral QAC breakfast  . multivitamin with minerals  1 tablet Oral Daily  . nystatin   Topical TID  . potassium chloride  40 mEq Oral Daily  . torsemide  60 mg Oral BID   Elmarie Shiley, MD 11/05/2018, 8:53 AM

## 2018-11-05 NOTE — Discharge Summary (Signed)
Jordan Le, is a 73 y.o. female  DOB 04-10-1945  MRN [id number].  Admission date:  10/25/2018  Admitting Physician  Albertine Patricia, MD  Discharge Date:  11/05/2018   Primary MD  Quintin Alto, [name], MD  Recommendations for primary care physician for things to follow:  1)Very low-salt diet advised 2)Weigh yourself daily, call if you gain more than 3 pounds in 1 day or more than 5 pounds in 1 week as your diuretic medications may need to be adjusted 3)Limit your Fluid  intake to no more than 60 ounces (1.8 Liters) per day 4)Avoid ibuprofen/Advil/Aleve/Motrin/Goody Powders/Naproxen/BC powders/Meloxicam/Diclofenac/Indomethacin and other Nonsteroidal anti-inflammatory medications as these will make you more likely to bleed and can cause stomach ulcers, can also cause Kidney problems.  5) please note that there has been multiple medication changes to your regimen 6)Repeat BMP and CBC test within a week with a primary care physician advised 7)Follow-up with Dr. Gala Romney gastroenterologist within 1 to 2 weeks for recheck and reevaluation for Liver cirrhosis 8)Follow-up with Nephrologist at [facility] in [place] in 1 week for recheck 9)STOP Metformin 10)Stop Losartan/Cozaar 11) keep your feet elevated 12) apply TED compression stockings on every morning and take them off at bedtime in the evening   Admission Diagnosis  Anasarca [R60.1] Acute renal failure, unspecified acute renal failure type ([place]) [N17.9]   Discharge Diagnosis  Anasarca [R60.1] Acute renal failure, unspecified acute renal failure type ([place]) [N17.9]    Principal Problem:   Acute renal failure (ARF) ([place]) Active Problems:   Endometrial cancer ([place])   Diabetes mellitus without complication ([place])   Cirrhosis of liver with ascites ([place])   Essential hypertension   SOB (shortness of breath)   Acute lower UTI   AKI (acute kidney injury)  ([place])   Anasarca   Pleural effusion on left   SVT (supraventricular tachycardia) ([place])      Past Medical History:  Diagnosis Date   Arthritis    Cancer ([place])    endometrial cancer   Cirrhosis of liver ([place])    Diabetes mellitus without complication ([place])    type 2   High cholesterol    Hypertension    Mini stroke ([place])    2011   Pneumonia    Renal disorder     Past Surgical History:  Procedure Laterality Date   ABDOMINAL HYSTERECTOMY     BREAST BIOPSY Right    ~15 years ago-2005   OTHER SURGICAL HISTORY  2009   Uterine Polyp removal   polyps removed from the uterus     ROBOTIC ASSISTED TOTAL HYSTERECTOMY WITH BILATERAL SALPINGO OOPHERECTOMY Bilateral 05/07/2018   Procedure: XI ROBOTIC ASSISTED TOTAL HYSTERECTOMY WITH BILATERAL [place];  Surgeon: Everitt Amber, MD;  Location: [name];  Service: Gynecology;  Laterality: Bilateral;   SENTINEL NODE BIOPSY Bilateral 05/07/2018   Procedure: SENTINEL NODE BIOPSY;  Surgeon: Everitt Amber, MD;  Location: [name];  Service: Gynecology;  Laterality: Bilateral;       HPI  from the history  and physical done on the day of admission:    Chief Complaint: SOB  HPI: Jordan Le is a 73 y.o. female with a history of type 2 diabetes, hypertension, hyperlipidemia, cirrhosis of the liver, endometrial cancer status post hysterectomy in February.  Over the past couple of months, the patient has experienced increasing shortness of breath that is worse with exertion.  She is also had steadily increasing peripheral edema with increasing abdominal distention.  Shortness of breath is worse with ambulation and improved with rest.  Denies cough, wheezing, sputum production.  No fevers or chills.  She does have minimal urine output and urinates a small amount 2-3 times a day.  She notes diminished appetite and is not able to drink a lot of fluid because she feels full all the time.  Emergency Department  Course: COVID pending.  CBC normal.  BNP normal.  UA shows UTI.  CT of the abdomen shows significant ascites with new left sided pleural effusion.  Chest x-ray shows the small left pleural effusion and possible left lower lobe airspace consolidation.    Hospital Course:     Brief History:  73 year old female with a history of diabetes mellitus, hypertension, liver cirrhosis, endometrial carcinoma status post hysterectomy February 2020 presented with 69-month history of shortness of breath and dyspnea on exertion as well as decreased oral intake. She has also complained of increasing peripheral edema and increasing abdominal girth she states that she usually sleeps in a recliner, but is unable to tell me whether she truly has orthopnea or not because she states that she has never tried to lay flat in her life. She denied any fevers, chills, headache, chest pain, coughing, hemoptysis, nausea, vomiting, diarrhea, abdominal pain.  Notably, the patient states that she was started on furosemide 20 mg daily in early April 2020. Her dose of furosemide was subsequently increased to 40 mg daily in mid May 2020. Approximately 3 weeks prior to this admission, spironolactone was added. She denies any outpatient NSAID use. She continues on losartan. The patient states that she was told about 1 month prior to this admission that she had a problem with her kidneys, but this was not investigated further.  In the ED, the patient was afebrile hemodynamically stable saturating 94-96% room air. Serum creatinine was 3.94. WBC 9.5, hemoglobin 11.3, platelets 213,000. CT of the abdomen pelvis showed a large left pleural effusion with left basilar atelectasis. There was a shrunken nodular liver consistent with liver cirrhosis. There was no bowel wall thickening. There was significant ascites.  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 Sydell Axon the gastroenterologist as outpatient -Per nephrologist   -okay to discharge home on p.o. torsemide   Acute Kidney Injury -Likely due to hemodynamic changes in the setting of furosemide, spironolactone, losartan use -Urine sodium and creatinine <1 -Renal ultrasound negative for hydronephrosis -Nephrology consultation appreciated -urine protein/creatinine ratio--0.25 -Creatinine improved from a peak of  3.96 down to 2.0 Left pleural effusion --per Dr. Carmelina Dane overall small--defer Inocencio Homes for now -reassess for thora if respiratory status worsens  again -echo shows normal EF  SVT/Atrial tachycardia -personally reviewed EKG--SVT, nonspecific T wave change -Repeat EKG from 11/05/2018 sinus, okay to discharge on Cardizem -Echo shows normal EF -TSH 5.4  LLE Stasis Dermatitis -doubt cellulitis -present x 3 months -Completed ceftriaxone without change in skin color -Improving with diuresis --C/n [place] and ACE Wraps or TEDs daily  Diabetes mellitus type 2 -10/26/18 Hemoglobin A1c--4.7--putting her at risk for hypoglycemia --Discontinued metformin -  Hypothyroidism -TSH is 5.4 -Continue Synthroid  Endometrial Carcinoma -Status post hysterectomy 05/07/2018 -follow up Dr. Denman George  Hyperlipidemia -continue statin  Disposition --patient declines home health  services including RN and PT -No hypoxia post ambulation -Discharge home  Family Communication:Granddaughter updated peviously, patient is alert and oriented  Consultants:GI, renal  Code Status: FULL   DVT Prophylaxis: Patient was on [place] Lovenox during this hospital stay  Discharge Condition: stable  Follow UP--outpatient follow-up with GI, and nephrology  Follow-up Information    Corliss Parish, MD Follow up on 11/20/2018.   Specialty: Nephrology Why: Appointment at 10:30AM Contact information: [place] [address] [phone]           Diet and Activity recommendation:  As advised  Discharge Instructions    Discharge Instructions    Call MD for:  difficulty breathing, headache or visual disturbances   Complete by: As directed    Call MD for:  persistant dizziness or light-headedness   Complete by: As directed    Call MD for:  persistant nausea and vomiting   Complete by: As directed    Call MD for:  temperature >100.4   Complete by: As directed    Diet - low sodium heart healthy   Complete by: As directed    Discharge instructions   Complete by: As directed    1)Very low-salt diet advised 2)Weigh yourself daily, call if you gain more than 3 pounds in 1 day or more than 5 pounds in 1 week as your diuretic medications may need to be adjusted 3)Limit your Fluid  intake to no more than 60 ounces (1.8 Liters) per day 4)Avoid ibuprofen/Advil/Aleve/Motrin/Goody Powders/Naproxen/BC powders/Meloxicam/Diclofenac/Indomethacin and other Nonsteroidal anti-inflammatory medications as these will make you more likely to bleed and can cause stomach ulcers, can also cause Kidney problems.  5) please note that there has been multiple medication changes to your regimen 6)Repeat BMP and CBC test within a week with a primary care physician advised 7)Follow-up with Dr. Gala Romney gastroenterologist within 1 to 2 weeks for recheck and reevaluation for Liver  cirrhosis 8)Follow-up with Nephrologist at [facility] in [place] in 1 week for recheck 9)STOP Metformin 10)Stop Losartan/Cozaar 11) keep your feet elevated 12)Apply TED compression stockings on every morning and take them off at bedtime in the evening   Increase activity slowly   Complete by: As directed       Discharge Medications     Allergies as of 11/05/2018   No Known Allergies     Medication List    STOP taking these medications   ibuprofen 200 MG tablet Commonly known as: ADVIL   Lasix 40 MG tablet Generic drug: furosemide   losartan 25 MG tablet Commonly known as: COZAAR   metFORMIN 500 MG 24 hr tablet Commonly known as: GLUCOPHAGE-XR   simvastatin 20 MG tablet Commonly known as: ZOCOR     TAKE these medications   aspirin EC 81 MG tablet Take 1 tablet (81 mg total) by mouth daily with breakfast. What changed: when to take this  atorvastatin 10 MG tablet Commonly known as: LIPITOR Take 1 tablet (10 mg total) by mouth daily at 6 PM.   diltiazem 120 MG 24 hr capsule Commonly known as: Cardizem CD Take 1 capsule (120 mg total) by mouth daily.   feeding supplement (ENSURE ENLIVE) Liqd Take 237 mLs by mouth 2 (two) times daily between meals.   levothyroxine 75 MCG tablet Commonly known as: SYNTHROID Take 75 mcg by mouth daily before breakfast.   multivitamin with minerals Tabs tablet Take 1 tablet by mouth daily. Start taking on: November 06, 2018   nystatin powder Commonly known as: MYCOSTATIN/NYSTOP Apply topically 3 (three) times daily.   spironolactone 25 MG tablet Commonly known as: Aldactone Take 1 tablet (25 mg total) by mouth daily.   torsemide 20 MG tablet Commonly known as: DEMADEX Take 3 tablets (60 mg total) by mouth 2 (two) times daily.      Major procedures and Radiology Reports - PLEASE review detailed and final reports for all details, in brief -   Ct Abdomen Pelvis Wo Contrast  Result Date: 10/25/2018 CLINICAL DATA:   Acute renal injury, history of prior hysterectomy EXAM: CT ABDOMEN AND PELVIS WITHOUT CONTRAST TECHNIQUE: Multidetector CT imaging of the abdomen and pelvis was performed following the standard protocol without IV contrast. COMPARISON:  04/22/2018 FINDINGS: Lower chest: Large left pleural effusion is noted with underlying left basilar atelectasis. The right lung base is clear. Hepatobiliary: Shrunken liver with nodular contour consistent with underlying cirrhosis. No focal mass is noted. The gallbladder is well distended with dependent gallstones similar to that noted on the prior exam. Pancreas: Unremarkable. No pancreatic ductal dilatation or surrounding inflammatory changes. Spleen: Normal in size without focal abnormality. Adrenals/Urinary Tract: Adrenal glands are within normal limits. Small exophytic cyst is noted arising from the right kidney similar to that seen on prior exam. No renal calculi or obstructive changes are seen. The bladder is decompressed by Foley catheter. Stomach/Bowel: Appendix is not well visualized although no inflammatory changes are seen. No obstructive or inflammatory changes of large or small bowel are seen. The stomach is within normal limits. Vascular/Lymphatic: Aortic atherosclerosis. No enlarged abdominal or pelvic lymph nodes. Reproductive: Uterus is been surgically removed consistent with the given clinical history. Other: Significant ascites is noted new from the prior exam. This may be related to the underlying portal hypertension. Musculoskeletal: No acute or significant osseous findings. IMPRESSION: Changes of hepatic cirrhosis and significant ascites which is new from the prior exam. New moderate left-sided pleural effusion. Electronically Signed   By: Inez Catalina M.D.   On: 10/25/2018 21:34   Dg Chest 2 View  Result Date: 10/25/2018 CLINICAL DATA:  Shortness of breath EXAM: CHEST - 2 VIEW COMPARISON:  Chest CT April 22, 2018 FINDINGS: There is airspace consolidation  in the left lower lobe with small left pleural effusion. The lungs elsewhere are clear. Heart size and pulmonary vascularity are normal. No adenopathy. No bone lesions. IMPRESSION: Left lower lobe airspace consolidation, felt to represent a degree of pneumonia. Small left pleural effusion. Lungs elsewhere clear. Cardiac silhouette normal. No adenopathy. Followup PA and lateral chest radiographs recommended in 3-4 weeks following trial of antibiotic therapy to ensure resolution and exclude underlying malignancy. Electronically Signed   By: Lowella Grip III M.D.   On: 10/25/2018 17:31   US Renal  Result Date: 10/26/2018 CLINICAL DATA:  Acute kidney injury EXAM: RENAL / URINARY TRACT ULTRASOUND COMPLETE COMPARISON:  CT abdomen pelvis October 25, 2018 FINDINGS: Right Kidney:  Renal measurements: 11.3 x 4.9 x 7.1 cm. = volume: 206 mL. Echogenicity is normal. There is a small anechoic cyst seen within the midpole measuring 0.9 x 1.0 x 0.9 cm. Left Kidney: Renal measurements: 11.8 x 5.2 x 5.8 cm = volume: 187 mL. Echogenicity within normal limits. No mass or hydronephrosis visualized. Bladder: Decompressed due to Foley catheter in place. Abdominopelvic ascites is seen. IMPRESSION: Small right midpole renal cyst.  Otherwise normal appearing kidneys. Electronically Signed   By: Prudencio Pair M.D.   On: 10/26/2018 11:49   US Paracentesis  Result Date: 10/26/2018 INDICATION: Cirrhosis, ascites EXAM: ULTRASOUND GUIDED DIAGNOSTIC AND THERAPEUTIC PARACENTESIS MEDICATIONS: None. COMPLICATIONS: None immediate. PROCEDURE: Procedure, benefits, and risks of procedure were discussed with patient. Written informed consent for procedure was obtained. Time out protocol followed. Adequate collection of ascites localized by ultrasound in RIGHT lower quadrant. Skin prepped and draped in usual sterile fashion. Skin and soft tissues anesthetized with 10 mL of 1% lidocaine. 5 [place] Yueh catheter placed into peritoneal cavity. 2 L of  amber colored ascitic fluid aspirated by vacuum bottle suction. Volume removed limited to 2 L at request of ordering physician. Procedure tolerated well by patient without immediate complication. FINDINGS: A total of approximately 2 L of ascitic fluid was removed. Samples were sent to the laboratory as requested by the clinical team. IMPRESSION: Successful ultrasound-guided paracentesis yielding 2 liters of peritoneal fluid. Electronically Signed   By: Lavonia Dana M.D.   On: 10/26/2018 11:29   Micro Results   Today   Subjective    Patterson Hammersmith today has no new complaints       --Voiding well -Had BM --Ambulating without significant dyspnea on exertion or hypoxia or chest pains   Patient has been seen and examined prior to discharge   Objective   Blood pressure (!) 93/46, pulse (!) 110, temperature 98.4 F (36.9 C), temperature source Oral, resp. rate 18, height 5\' 3"  (1.6 m), weight 96.6 kg, SpO2 97 %.   Intake/Output Summary (Last 24 hours) at 11/05/2018 1642 Last data filed at 11/05/2018 0900 Gross per 24 hour  Intake 480 ml  Output 300 ml  Net 180 ml   Exam Gen:- Awake Alert, no acute distress , speaking in complete sentences HEENT:- [place].AT, No sclera icterus Neck-Supple Neck,No JVD,.  Lungs-improved air movement bilaterally, no wheezing  CV- S1, S2 normal, regular Abd-  +ve B.Sounds, Abd Soft, No tenderness, abdomen is less distended , ascites is less impressive  extremity/Skin:-Improved lower extremity edema, improved anasarca , chronic venous stasis changes in lower extremities bilaterally,  good pulses Psych-affect is appropriate, oriented x3 Neuro-no new focal deficits, no tremors    Data Review   CBC w Diff:  Lab Results  Component Value Date   WBC 13.1 (H) 11/05/2018   HGB 9.1 (L) 11/05/2018   HCT 28.5 (L) 11/05/2018   PLT 152 11/05/2018   LYMPHOPCT 19 10/25/2018   MONOPCT 19 10/25/2018   EOSPCT 5 10/25/2018   BASOPCT 1 10/25/2018    CMP:  Lab Results   Component Value Date   NA 137 11/05/2018   K 4.1 11/05/2018   CL 99 11/05/2018   CO2 28 11/05/2018   BUN 44 (H) 11/05/2018   CREATININE 2.09 (H) 11/05/2018   PROT 5.1 (L) 10/31/2018   ALBUMIN 3.3 (L) 11/05/2018   BILITOT 1.9 (H) 10/31/2018   ALKPHOS 31 (L) 10/31/2018   AST 19 10/31/2018   ALT 10 10/31/2018  .   Total Discharge time  is about 33 minutes  Roxan Hockey M.D on 11/05/2018 at 4:42 PM  Go to [email or web address] -  for contact info  [facility] - Office  [phone]

## 2018-11-05 NOTE — Discharge Instructions (Signed)
1)Very low-salt diet advised 2)Weigh yourself daily, call if you gain more than 3 pounds in 1 day or more than 5 pounds in 1 week as your diuretic medications may need to be adjusted 3)Limit your Fluid  intake to no more than 60 ounces (1.8 Liters) per day 4)Avoid ibuprofen/Advil/Aleve/Motrin/Goody Powders/Naproxen/BC powders/Meloxicam/Diclofenac/Indomethacin and other Nonsteroidal anti-inflammatory medications as these will make you more likely to bleed and can cause stomach ulcers, can also cause Kidney problems.  5) please note that there has been multiple medication changes to your regimen 6)Repeat BMP and CBC test within a week with a primary care physician advised 7)Follow-up with Dr. Gala Romney gastroenterologist within 1 to 2 weeks for recheck and reevaluation for Liver cirrhosis 8)Follow-up with Nephrologist at [facility] in [place] in 1 week for recheck 9)STOP Metformin 10)Stop Losartan/Cozaar 11) keep your feet elevated 12) apply TED compression stockings on every morning and take them off at bedtime in the evening

## 2018-11-06 LAB — HEMOCHROMATOSIS DNA-PCR(C282Y,H63D)

## 2018-11-16 ENCOUNTER — Encounter: Payer: Self-pay | Admitting: *Deleted

## 2018-11-16 ENCOUNTER — Other Ambulatory Visit: Payer: Self-pay | Admitting: *Deleted

## 2018-11-16 ENCOUNTER — Encounter: Payer: Self-pay | Admitting: Nurse Practitioner

## 2018-11-16 ENCOUNTER — Ambulatory Visit (INDEPENDENT_AMBULATORY_CARE_PROVIDER_SITE_OTHER): Payer: PPO | Admitting: Nurse Practitioner

## 2018-11-16 ENCOUNTER — Other Ambulatory Visit (HOSPITAL_COMMUNITY): Payer: Self-pay | Admitting: Nurse Practitioner

## 2018-11-16 ENCOUNTER — Other Ambulatory Visit: Payer: Self-pay

## 2018-11-16 VITALS — BP 119/70 | HR 131 | Temp 96.8°F | Ht 63.0 in | Wt 190.2 lb

## 2018-11-16 DIAGNOSIS — R601 Generalized edema: Secondary | ICD-10-CM

## 2018-11-16 DIAGNOSIS — Z2839 Other underimmunization status: Secondary | ICD-10-CM | POA: Insufficient documentation

## 2018-11-16 DIAGNOSIS — Z789 Other specified health status: Secondary | ICD-10-CM | POA: Diagnosis not present

## 2018-11-16 DIAGNOSIS — K7460 Unspecified cirrhosis of liver: Secondary | ICD-10-CM

## 2018-11-16 DIAGNOSIS — R188 Other ascites: Secondary | ICD-10-CM

## 2018-11-16 NOTE — Patient Instructions (Signed)
Your health issues we discussed today were:   Likely new cirrhosis: 1. Have your labs completed when you are able to 2. We will help schedule your ultrasound for you 3. Obtain a vaccine for hepatitis A and B.  We have printed the order.  You can call local pharmacies or your primary care provider to see who offers the vaccine 4. Follow a 2 g sodium diet (information below) 5. We will help schedule your upper endoscopy for you 6. Call us if you have any worsening or severe symptoms.  Overall I recommend:  1. Continue your other current medications 2. Return for follow-up in 6 weeks 3. Call us if you have any questions or concerns.   Because of recent events of COVID-19 ("Coronavirus"), follow CDC recommendations:  Wash your hand frequently Avoid touching your face Stay away from people who are sick If you have symptoms such as fever, cough, shortness of breath then call your healthcare provider for further guidance If you are sick, STAY AT HOME unless otherwise directed by your healthcare provider. Follow directions from state and national officials regarding staying safe   At [facility] we value your feedback. You may receive a survey about your visit today. Please share your experience as we strive to create trusting relationships with our patients to provide genuine, compassionate, quality care.  We appreciate your understanding and patience as we review any laboratory studies, imaging, and other diagnostic tests that are ordered as we care for you. Our office policy is 5 business days for review of these results, and any emergent or urgent results are addressed in a timely manner for your best interest. If you do not hear from our office in 1 week, please contact us.   We also encourage the use of MyChart, which contains your medical information for your review as well. If you are not enrolled in this feature, an access code is on this after visit summary for your  convenience. Thank you for allowing [place] to be involved in your care.  It was great to see you today!  I hope you have a great summer!!

## 2018-11-16 NOTE — Progress Notes (Signed)
REVIEWED-NO ADDITIONAL RECOMMENDATIONS.  Referring Provider: Manon Hilding, MD Primary Care Physician:  Manon Hilding, MD Primary GI:  Dr. Oneida Alar  Chief Complaint  Patient presents with   Cirrhosis    admitted 10/25/18   Constipation   no appetite    HPI:   Jordan Le is a 73 y.o. female who presents on referral from primary care for cirrhosis. Reviewed information provided with referral including ultrasound dated 10/13/2018 which found previous hysterectomy and bilateral nephrectomy without focal abnormality within the pelvis.  Abdominal ultrasound completed the same day found moderate gallbladder sludge with mild cholelithiasis, borderline wall thickening nonspecific in the setting of moderate ascites, small nodular liver compatible with known cirrhosis.  Care note dated 10/15/2018 when she presented with edema and bilateral lower extremities and the abdomen.  Noted tympanic abdomen on percussion with a fluid wave.  Known history of cirrhosis questionable due to fatty liver.  Labs dated 08/18/2018 which found creatinine is 0.6.,  Albumin 3.0 which is low.  AST/ALT normal at 36/18, total bilirubin elevated at 2.1, alkaline phosphatase normal at 85.  CBC found normal hemoglobin at 11.8 and normal WBC count of 5.7.  Platelet count normal at 170.  We did see the patient when she was admitted from the hospital.  She was admitted 10/25/2018 through 11/05/2018 for anasarca and acute renal failure.  At that time noted history of cirrhosis of the liver.  On admission she admitted steadily increasing peripheral edema and abdominal distention.  Son noted a 63-month history of shortness of breath and dyspnea on exertion and decreased oral intake.  Started on furosemide 20 mg in early April which was increased to 40 mg in mid May.  Spironolactone was added 3 weeks prior to admission.  Was told 1 month prior to admission she had kidney issues but this has not been investigated further as of yet.  On  admission her creatinine was 3.94, hemoglobin 11.3, platelets 213,000.  CT of the abdomen and pelvis with a large left pleural effusion and bibasilar atelectasis.  Also noted shrunken nodular liver consistent with cirrhosis and significant ascites.    We are consulted during this admission.  Her liver cirrhosis was felt likely secondary to [name].  She underwent paracentesis 10/26/2018 with 2 L removed.  Hepatitis B and C serologies were negative, autoimmune antibodies negative, alpha-1 antitrypsin level normal.  After her paracentesis albumin was given due to need to hold diuretics due to acute kidney injury.  She was given Rocephin for SBP prophylaxis.  Overall recommended need for EGD for variceal screening, outpatient follow-up for further cirrhosis care including hepatitis A/B vaccination, 2 g sodium diet, await INR and follow-up on fluid analysis.  CMP completed 10/31/2018 which found normal AST/ALT, normal alkaline phosphatase, bilirubin elevated at 1.9 which is an improvement from 2.3, two days prior.  Recent INR completed 10/29/2018 which was found to be elevated at 2.0.  10/28/2018 was normal at 6.6. MELD score based on most recent labs calculated at 26 (10/29/2018) and Child-Pugh: B-C.   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  There is a possibility of a rare affected individual with this mutation and recommended clinical correlation including ferritin.  Ferritin was completed 10/26/2018 which was 584, although this could be acute phase reactant given ongoing illness in the hospital.  Today she states she's doing ok overall. After hysterectomy in March was told she had fatty liver, but  didn't follow-up at that point. Needs to message her family because she thinks her grandfather died of liver problems although he doesn't drink. She doesn't drink ETOH at all. Denies abdominal pain. Does have abdominal swelling and  discomfort/bloating/full. LE edema much better than when she's been in the hospital. She's been wrapping up her ankles. Does have some LE edema today. Has never had an EGD done before. States she is currently eating almost no salt currently. Eats a lot of fruit right now. Denies N/V. No appetite because things don't taste good, has fullness/boating. Denies hematochezia, melena, fever, chills. Has been losing weight with diuretics and poor appetite. Has never had Hep A or B vaccines. Denies URI or flu-like symptoms. Denies loss of sense of taste or smell. Denies chest pain, dyspnea, dizziness, lightheadedness, syncope, near syncope. Denies any other upper or lower GI symptoms.  Has a history of metabolic syndrome: htn, hypercholesterolemia, T2DM.  Past Medical History:  Diagnosis Date   Arthritis    Cancer [name])    endometrial cancer   Cirrhosis of liver ([name])    Diabetes mellitus without complication ([place])    type 2   High cholesterol    Hypertension    Mini stroke ([name])    2011   Pneumonia    Renal disorder     Past Surgical History:  Procedure Laterality Date   ABDOMINAL HYSTERECTOMY     BREAST BIOPSY Right    ~15 years ago-2005   OTHER SURGICAL HISTORY  2009   Uterine Polyp removal   polyps removed from the uterus     ROBOTIC ASSISTED TOTAL HYSTERECTOMY WITH BILATERAL SALPINGO OOPHERECTOMY Bilateral 05/07/2018   Procedure: XI ROBOTIC [place];  Surgeon: Everitt Amber, MD;  Location: [name];  Service: Gynecology;  Laterality: Bilateral;   SENTINEL NODE BIOPSY Bilateral 05/07/2018   Procedure: SENTINEL NODE BIOPSY;  Surgeon: Everitt Amber, MD;  Location: [name];  Service: Gynecology;  Laterality: Bilateral;    Current Outpatient Medications  Medication Sig Dispense Refill   aspirin EC 81 MG tablet Take 1 tablet (81 mg total) by mouth daily with breakfast. 30 tablet 11   atorvastatin (LIPITOR)  10 MG tablet Take 1 tablet (10 mg total) by mouth daily at 6 PM. 30 tablet 2   diltiazem (CARDIZEM CD) 120 MG 24 hr capsule Take 1 capsule (120 mg total) by mouth daily. 30 capsule 11   feeding supplement, ENSURE ENLIVE, (ENSURE ENLIVE) LIQD Take 237 mLs by mouth 2 (two) times daily between meals. 237 mL 12   levothyroxine (SYNTHROID) 75 MCG tablet Take 75 mcg by mouth daily before breakfast.      Multiple Vitamin (MULTIVITAMIN WITH MINERALS) TABS tablet Take 1 tablet by mouth daily. 30 tablet 2   nystatin (MYCOSTATIN/NYSTOP) powder Apply topically 3 (three) times daily. 15 g 0   OVER THE COUNTER MEDICATION Uses suppository for constipation as needed     spironolactone (ALDACTONE) 25 MG tablet Take 1 tablet (25 mg total) by mouth daily. 30 tablet 6   torsemide (DEMADEX) 20 MG tablet Take 3 tablets (60 mg total) by mouth 2 (two) times daily. 180 tablet 2   No current facility-administered medications for this visit.     Allergies as of 11/16/2018   (No Known Allergies)    Family History  Problem Relation Age of Onset   Lung cancer Mother    Hypertension Father    Pancreatic cancer Brother    Liver disease  Neg Hx     Social History   Socioeconomic History   Marital status: Married    Spouse name: Not on file   Number of children: Not on file   Years of education: Not on file   Highest education level: Not on file  Occupational History   Not on file  Social Needs   Financial resource strain: Not on file   Food insecurity    Worry: Not on file    Inability: Not on file   Transportation needs    Medical: Not on file    Non-medical: Not on file  Tobacco Use   Smoking status: Never Smoker   Smokeless tobacco: Never Used  Substance and Sexual Activity   Alcohol use: Never    Frequency: Never   Drug use: Never   Sexual activity: Not Currently  Lifestyle   Physical activity    Days per week: Not on file    Minutes per session: Not on file    Stress: Not on file  Relationships   Social connections    Talks on phone: Not on file    Gets together: Not on file    Attends religious service: Not on file    Active member of club or organization: Not on file    Attends meetings of clubs or organizations: Not on file    Relationship status: Not on file  Other Topics Concern   Not on file  Social History Narrative   Not on file    Review of Systems: Complete ROS negative except as per HPI.   Physical Exam: BP 119/70    Pulse (!) 131    Temp (!) 96.8 F (36 C) (Temporal)    Ht 5\' 3"  (1.6 m)    Wt 190 lb 3.2 oz (86.3 kg)    BMI 33.69 kg/m  General:   Alert and oriented. Pleasant and cooperative. Well-nourished and well-developed.  Head:  Normocephalic and atraumatic. Eyes:  Without icterus, sclera clear and conjunctiva pink.  Ears:  Normal auditory acuity. Cardiovascular:  S1, S2 present without murmurs appreciated. Extremities without clubbing or edema. Respiratory:  Clear to auscultation bilaterally. No wheezes, rales, or rhonchi. No distress.  Gastrointestinal:  +BS, soft, non-tender and non-distended. No HSM noted. No guarding or rebound. No masses appreciated. Some mild persistent bilateral LE edema. Rectal:  Deferred  Musculoskalatal:  Symmetrical without gross deformities. Skin:  Intact without significant lesions or rashes. Neurologic:  Alert and oriented x4;  grossly normal neurologically. Psych:  Alert and cooperative. Normal mood and affect. Heme/Lymph/Immune: No excessive bruising noted.    11/16/2018 10:00 AM   Disclaimer: This note was dictated with voice recognition software. Similar sounding words can inadvertently be transcribed and may not be corrected upon review.

## 2018-11-20 ENCOUNTER — Ambulatory Visit (HOSPITAL_COMMUNITY)
Admission: RE | Admit: 2018-11-20 | Discharge: 2018-11-20 | Disposition: A | Discharge status: Home / Self Care | Payer: PPO | Source: Ambulatory Visit | Attending: Nurse Practitioner | Admitting: Nurse Practitioner

## 2018-11-20 ENCOUNTER — Ambulatory Visit (HOSPITAL_COMMUNITY): Payer: PPO

## 2018-11-20 ENCOUNTER — Other Ambulatory Visit (HOSPITAL_COMMUNITY)
Admission: RE | Admit: 2018-11-20 | Discharge: 2018-11-20 | Disposition: A | Discharge status: Home / Self Care | Payer: PPO | Source: Ambulatory Visit | Attending: Gastroenterology | Admitting: Gastroenterology

## 2018-11-20 ENCOUNTER — Telehealth: Payer: Self-pay | Admitting: *Deleted

## 2018-11-20 ENCOUNTER — Other Ambulatory Visit: Payer: Self-pay

## 2018-11-20 DIAGNOSIS — R188 Other ascites: Secondary | ICD-10-CM | POA: Insufficient documentation

## 2018-11-20 DIAGNOSIS — Z20828 Contact with and (suspected) exposure to other viral communicable diseases: Secondary | ICD-10-CM | POA: Diagnosis not present

## 2018-11-20 DIAGNOSIS — Z01818 Encounter for other preprocedural examination: Secondary | ICD-10-CM | POA: Insufficient documentation

## 2018-11-20 DIAGNOSIS — I129 Hypertensive chronic kidney disease with stage 1 through stage 4 chronic kidney disease, or unspecified chronic kidney disease: Secondary | ICD-10-CM | POA: Diagnosis not present

## 2018-11-20 DIAGNOSIS — K7460 Unspecified cirrhosis of liver: Secondary | ICD-10-CM

## 2018-11-20 DIAGNOSIS — K8020 Calculus of gallbladder without cholecystitis without obstruction: Secondary | ICD-10-CM | POA: Insufficient documentation

## 2018-11-20 DIAGNOSIS — N179 Acute kidney failure, unspecified: Secondary | ICD-10-CM | POA: Diagnosis not present

## 2018-11-20 DIAGNOSIS — Z789 Other specified health status: Secondary | ICD-10-CM | POA: Diagnosis not present

## 2018-11-20 DIAGNOSIS — R601 Generalized edema: Secondary | ICD-10-CM | POA: Diagnosis not present

## 2018-11-20 DIAGNOSIS — D631 Anemia in chronic kidney disease: Secondary | ICD-10-CM | POA: Diagnosis not present

## 2018-11-20 DIAGNOSIS — N184 Chronic kidney disease, stage 4 (severe): Secondary | ICD-10-CM | POA: Diagnosis not present

## 2018-11-20 LAB — SARS CORONAVIRUS 2 (TAT 6-24 HRS): SARS Coronavirus 2: NEGATIVE

## 2018-11-20 NOTE — Telephone Encounter (Signed)
Melanie from [facility] called and wanted to speak to you.  It is about a pt scheduled for Monday.  Can you call her back when you get a chance?  [phone]

## 2018-11-20 NOTE — Telephone Encounter (Signed)
Called Jordan Le. Confirming patient is conscious sedation

## 2018-11-23 ENCOUNTER — Other Ambulatory Visit: Payer: Self-pay

## 2018-11-23 ENCOUNTER — Telehealth: Payer: Self-pay | Admitting: Gastroenterology

## 2018-11-23 ENCOUNTER — Encounter (HOSPITAL_COMMUNITY): Payer: Self-pay | Admitting: *Deleted

## 2018-11-23 ENCOUNTER — Ambulatory Visit (HOSPITAL_COMMUNITY)
Admission: RE | Admit: 2018-11-23 | Discharge: 2018-11-23 | Disposition: A | Discharge status: Home / Self Care | Payer: PPO | Attending: Gastroenterology | Admitting: Gastroenterology

## 2018-11-23 ENCOUNTER — Encounter (HOSPITAL_COMMUNITY)
Admission: RE | Disposition: A | Discharge status: Home / Self Care | Payer: Self-pay | Source: Home / Self Care | Attending: Gastroenterology

## 2018-11-23 DIAGNOSIS — K2980 Duodenitis without bleeding: Secondary | ICD-10-CM | POA: Diagnosis not present

## 2018-11-23 DIAGNOSIS — K7460 Unspecified cirrhosis of liver: Secondary | ICD-10-CM

## 2018-11-23 DIAGNOSIS — K766 Portal hypertension: Secondary | ICD-10-CM | POA: Insufficient documentation

## 2018-11-23 DIAGNOSIS — Z7982 Long term (current) use of aspirin: Secondary | ICD-10-CM | POA: Diagnosis not present

## 2018-11-23 DIAGNOSIS — Z79899 Other long term (current) drug therapy: Secondary | ICD-10-CM | POA: Diagnosis not present

## 2018-11-23 DIAGNOSIS — I10 Essential (primary) hypertension: Secondary | ICD-10-CM | POA: Diagnosis not present

## 2018-11-23 DIAGNOSIS — R188 Other ascites: Secondary | ICD-10-CM

## 2018-11-23 DIAGNOSIS — I8510 Secondary esophageal varices without bleeding: Secondary | ICD-10-CM

## 2018-11-23 DIAGNOSIS — K2970 Gastritis, unspecified, without bleeding: Secondary | ICD-10-CM

## 2018-11-23 DIAGNOSIS — Z8249 Family history of ischemic heart disease and other diseases of the circulatory system: Secondary | ICD-10-CM | POA: Insufficient documentation

## 2018-11-23 DIAGNOSIS — E7800 Pure hypercholesterolemia, unspecified: Secondary | ICD-10-CM | POA: Diagnosis not present

## 2018-11-23 DIAGNOSIS — K2950 Unspecified chronic gastritis without bleeding: Secondary | ICD-10-CM | POA: Insufficient documentation

## 2018-11-23 DIAGNOSIS — Z8673 Personal history of transient ischemic attack (TIA), and cerebral infarction without residual deficits: Secondary | ICD-10-CM | POA: Diagnosis not present

## 2018-11-23 DIAGNOSIS — Z13810 Encounter for screening for upper gastrointestinal disorder: Secondary | ICD-10-CM | POA: Insufficient documentation

## 2018-11-23 DIAGNOSIS — E119 Type 2 diabetes mellitus without complications: Secondary | ICD-10-CM | POA: Insufficient documentation

## 2018-11-23 DIAGNOSIS — Z8542 Personal history of malignant neoplasm of other parts of uterus: Secondary | ICD-10-CM | POA: Insufficient documentation

## 2018-11-23 DIAGNOSIS — M1990 Unspecified osteoarthritis, unspecified site: Secondary | ICD-10-CM | POA: Insufficient documentation

## 2018-11-23 DIAGNOSIS — K2900 Acute gastritis without bleeding: Secondary | ICD-10-CM | POA: Diagnosis not present

## 2018-11-23 DIAGNOSIS — K3189 Other diseases of stomach and duodenum: Secondary | ICD-10-CM | POA: Diagnosis not present

## 2018-11-23 HISTORY — PX: BIOPSY: SHX5522

## 2018-11-23 HISTORY — PX: ESOPHAGOGASTRODUODENOSCOPY: SHX5428

## 2018-11-23 LAB — COMPREHENSIVE METABOLIC PANEL
AG Ratio: 0.8 (calc) — ABNORMAL LOW (ref 1.0–2.5)
ALT: 26 U/L (ref 6–29)
AST: 63 U/L — ABNORMAL HIGH (ref 10–35)
Albumin: 3 g/dL — ABNORMAL LOW (ref 3.6–5.1)
Alkaline phosphatase (APISO): 82 U/L (ref 37–153)
BUN/Creatinine Ratio: 24 (calc) — ABNORMAL HIGH (ref 6–22)
BUN: 36 mg/dL — ABNORMAL HIGH (ref 7–25)
CO2: 31 mmol/L (ref 20–32)
Calcium: 8.7 mg/dL (ref 8.6–10.4)
Chloride: 94 mmol/L — ABNORMAL LOW (ref 98–110)
Creat: 1.53 mg/dL — ABNORMAL HIGH (ref 0.60–0.93)
Globulin: 3.8 g/dL (calc) — ABNORMAL HIGH (ref 1.9–3.7)
Glucose, Bld: 109 mg/dL — ABNORMAL HIGH (ref 65–99)
Potassium: 3.7 mmol/L (ref 3.5–5.3)
Sodium: 137 mmol/L (ref 135–146)
Total Bilirubin: 3.8 mg/dL — ABNORMAL HIGH (ref 0.2–1.2)
Total Protein: 6.8 g/dL (ref 6.1–8.1)

## 2018-11-23 LAB — CBC WITH DIFFERENTIAL/PLATELET
Absolute Monocytes: 1775 cells/uL — ABNORMAL HIGH (ref 200–950)
Basophils Absolute: 191 cells/uL (ref 0–200)
Basophils Relative: 2.2 %
Eosinophils Absolute: 505 cells/uL — ABNORMAL HIGH (ref 15–500)
Eosinophils Relative: 5.8 %
HCT: 29.8 % — ABNORMAL LOW (ref 35.0–45.0)
Hemoglobin: 10.2 g/dL — ABNORMAL LOW (ref 11.7–15.5)
Lymphs Abs: 1462 cells/uL (ref 850–3900)
MCH: 32.1 pg (ref 27.0–33.0)
MCHC: 34.2 g/dL (ref 32.0–36.0)
MCV: 93.7 fL (ref 80.0–100.0)
MPV: 11.2 fL (ref 7.5–12.5)
Monocytes Relative: 20.4 %
Neutro Abs: 4768 cells/uL (ref 1500–7800)
Neutrophils Relative %: 54.8 %
Platelets: 234 10*3/uL (ref 140–400)
RBC: 3.18 10*6/uL — ABNORMAL LOW (ref 3.80–5.10)
RDW: 13.3 % (ref 11.0–15.0)
Total Lymphocyte: 16.8 %
WBC: 8.7 10*3/uL (ref 3.8–10.8)

## 2018-11-23 LAB — AFP TUMOR MARKER: AFP-Tumor Marker: 14.4 ng/mL — ABNORMAL HIGH

## 2018-11-23 LAB — IRON: Iron: 86 ug/dL (ref 45–160)

## 2018-11-23 LAB — FERRITIN: Ferritin: 1263 ng/mL — ABNORMAL HIGH (ref 16–288)

## 2018-11-23 LAB — PROTIME-INR
INR: 1.5 — ABNORMAL HIGH
Prothrombin Time: 15.3 s — ABNORMAL HIGH (ref 9.0–11.5)

## 2018-11-23 SURGERY — EGD (ESOPHAGOGASTRODUODENOSCOPY)
Anesthesia: Moderate Sedation

## 2018-11-23 MED ORDER — OMEPRAZOLE 20 MG PO CPDR: DELAYED_RELEASE_CAPSULE | ORAL | 3 refills | Status: DC

## 2018-11-23 MED ORDER — MEPERIDINE HCL 100 MG/ML IJ SOLN
INTRAMUSCULAR | Status: AC
  Filled 2018-11-23: qty 1

## 2018-11-23 MED ORDER — MIDAZOLAM HCL 5 MG/5ML IJ SOLN
INTRAMUSCULAR | Status: DC | PRN
  Administered 2018-11-23: 2 mg via INTRAVENOUS
  Administered 2018-11-23: 1 mg via INTRAVENOUS

## 2018-11-23 MED ORDER — LIDOCAINE VISCOUS HCL 2 % MT SOLN
OROMUCOSAL | Status: DC | PRN
  Administered 2018-11-23: 1 via OROMUCOSAL

## 2018-11-23 MED ORDER — LIDOCAINE VISCOUS HCL 2 % MT SOLN
OROMUCOSAL | Status: AC
  Filled 2018-11-23: qty 15

## 2018-11-23 MED ORDER — SODIUM CHLORIDE 0.9 % IV SOLN
INTRAVENOUS | Status: DC
  Administered 2018-11-23: 13:00:00 via INTRAVENOUS

## 2018-11-23 MED ORDER — MIDAZOLAM HCL 5 MG/5ML IJ SOLN
INTRAMUSCULAR | Status: AC
  Filled 2018-11-23: qty 5

## 2018-11-23 MED ORDER — MEPERIDINE HCL 100 MG/ML IJ SOLN
INTRAMUSCULAR | Status: DC | PRN
  Administered 2018-11-23 (×2): 25 mg

## 2018-11-23 NOTE — Discharge Instructions (Signed)
YOU DID NOT HAVE BULGING VEINS IN YOUR ESOPHAGUS. 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   TO PREVENT ULCERS AND GASTRITIS DUE TO DAILY ASPIRIN USE,  START OMEPRAZOLE.  TAKE 30 MINUTES PRIOR TO YOUR FIRST MEAL.   COMPLETE paracentesis ASAP. HOLD YOUR DIURETICS ON THE DAY OF THE PARACENTESIS. STRICTLY FOLLOW A LOW SALT DIET.  YOUR BIOPSY RESULTS WILL BE BACK IN 5 BUSINESS DAYS.  I WILL REFER YOU TO [facility] IN [place] FOR LIVER TRANSPLANT EVALUATION.  FOLLOW UP OFFICE VISIT IN FEB 2021.  REPEAT EGD IN 2 YEARS.    UPPER ENDOSCOPY AFTER CARE Read the instructions outlined below and refer to this sheet in the next week. These discharge instructions provide you with general information on caring for yourself after you leave the hospital. While your treatment has been planned according to the most current medical practices available, unavoidable complications occasionally occur. If you have any problems or questions after discharge, call DR. Hollyanne Schloesser, [phone].  ACTIVITY  You may resume your regular activity, but move at a slower pace for the next 24 hours.   Take frequent rest periods for the next 24 hours.   Walking will help get rid of the air and reduce the bloated feeling in your belly (abdomen).   No driving for 24 hours (because of the medicine (anesthesia) used during the test).   You may shower.   Do not sign any important legal documents or operate any machinery for 24 hours (because of the anesthesia used during the test).    NUTRITION  Drink plenty of fluids.   You may resume your normal diet as instructed by your doctor.   Begin with a light meal and progress to your normal diet. Heavy or fried foods are harder to digest and may make you feel sick to your stomach (nauseated).   Avoid alcoholic beverages for 24 hours or as instructed.    MEDICATIONS  You may resume your normal medications.   WHAT YOU CAN EXPECT  TODAY  Some feelings of bloating in the abdomen.   Passage of more gas than usual.    IF YOU HAD A BIOPSY TAKEN DURING THE UPPER ENDOSCOPY:  Eat a soft diet IF YOU HAVE NAUSEA, BLOATING, ABDOMINAL PAIN, OR VOMITING.    FINDING OUT THE RESULTS OF YOUR TEST Not all test results are available during your visit. DR. Oneida Alar WILL CALL YOU WITHIN 7 DAYS OF YOUR PROCEDUE WITH YOUR RESULTS. Do not assume everything is normal if you have not heard from DR. Yeng Perz IN ONE WEEK, CALL HER OFFICE AT [phone].  SEEK IMMEDIATE MEDICAL ATTENTION AND CALL THE OFFICE: [phone] IF:  You have more than a spotting of blood in your stool.   Your belly is swollen (abdominal distention).   You are nauseated or vomiting.   You have a temperature over 101F.   You have abdominal pain or discomfort that is severe or gets worse throughout the day.  Gastritis  Gastritis is an inflammation (the body's way of reacting to injury and/or infection) of the stomach. It is often caused by bacterial (germ) infections. It can also be caused BY ASPIRIN, BC/GOODY POWDER'S, (IBUPROFEN) MOTRIN, OR ALEVE (NAPROXEN), chemicals (including alcohol), SPICY FOODS, and medications. This illness may be associated with generalized malaise (feeling tired, not well), UPPER ABDOMINAL STOMACH cramps, and fever. One common bacterial cause of gastritis is an organism known as H. Pylori. This can be treated with antibiotics.

## 2018-11-23 NOTE — OR Nursing (Signed)
Dr. Oneida Alar notified of patient's heart rate 126-130-Sinus tachycardia. Order for NS 564ml bolus given. Orders carried out.

## 2018-11-23 NOTE — Progress Notes (Signed)
CC'D TO PCP °

## 2018-11-23 NOTE — Op Note (Signed)
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 Estimated Blood Loss:     Estimated blood loss was minimal. Procedure:                Pre-Anesthesia Assessment:                           - Prior to the procedure, a History and Physical                            was performed, and patient medications and                            allergies were reviewed. The patient's tolerance of                            previous anesthesia was also reviewed. The risks                            and benefits of the procedure and the sedation                            options and risks were discussed with the patient.                            All questions were answered, and informed consent                            was obtained. Prior Anticoagulants: The patient has                            taken no previous anticoagulant or antiplatelet                            agents except for aspirin. ASA Grade Assessment: II                            - A patient with mild systemic disease. After  reviewing the risks and benefits, the patient was                            deemed in satisfactory condition to undergo the                            procedure. After obtaining informed consent, the                            endoscope was passed under direct vision.              Throughout the procedure, the patient's blood                            pressure, pulse, and oxygen saturations were                            monitored continuously. The GIF-H190 ([id number]) was                            introduced through the mouth, and advanced to the                            second part of duodenum. The upper GI endoscopy was                            accomplished without difficulty. The patient                            tolerated the procedure well. Scope In: 1:43:07 PM Scope Out: 1:56:26 PM Total Procedure Duration: 0 hours 13 minutes 19 seconds  Findings:      The examined esophagus was normal.      Mild portal hypertensive gastropathy was found in the cardia and in the       gastric fundus.      Diffuse moderate inflammation characterized by congestion (edema),       erosions and erythema was found in the entire examined stomach.       Biopsies(2:BODY, 1:INCISURA, 2:ANTRUM) were taken with a cold forceps       for Helicobacter pylori testing.      Localized mild inflammation characterized by congestion (edema),       erosions and erythema was found in the duodenal bulb.      The second portion of the duodenum was normal. Impression:               - MILD Portal hypertensive gastropathy.                           - MODERATE Gastritis/MILD Duodenitis DUE TO ASA. Moderate Sedation:      Moderate (conscious) sedation was administered by the endoscopy nurse       and supervised by the endoscopist. The following parameters were       monitored: oxygen saturation, heart rate, blood pressure, and response       to care. Total physician intraservice time was 24 minutes. Recommendation:           -  Patient has a contact number available for                            emergencies. The signs and symptoms of potential                            delayed complications were discussed with the                            patient. Return to normal activities  tomorrow.                            Written discharge instructions were provided to the                            patient.                           - High fiber diet.                           - Continue present medications. ADD OMEPRAZOLE 30                            MINS PRIOR TO BREAKFAST                           - Await pathology results.                           - Return to my office in 6 months.                           - Repeat upper endoscopy 2-3 YEARS FOR VARICEAL                            SCREENING for surveillance. Procedure Code(s):        --- Professional ---                           [phone], Esophagogastroduodenoscopy, flexible,                            transoral; with biopsy, single or multiple                           99153, Moderate sedation; each additional 15                            minutes intraservice time                           G0500, Moderate sedation services provided by the                            same physician or other qualified health care  professional performing a gastrointestinal                            endoscopic service that sedation supports,                            requiring the presence of an independent trained                            observer to assist in the monitoring of the                            patient's level of consciousness and physiological                            status; initial 15 minutes of intra-service time;                            patient age 76 years or older (additional time may                            be reported with [phone], as appropriate) Diagnosis Code(s):        --- Professional ---                           K76.6, Portal hypertension                           K31.89, Other diseases of stomach and duodenum                           K29.70, Gastritis, unspecified, without bleeding                           K29.80, Duodenitis without bleeding                            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 All rights reserved. The codes documented in this report are preliminary and upon coder review may  be revised to meet current compliance requirements. Barney Drain, MD Barney Drain MD, MD 11/23/2018 2:20:04 PM This report has been signed electronically. Number of Addenda: 0

## 2018-11-23 NOTE — Assessment & Plan Note (Signed)
No documented immunity to hepatitis A or B given cirrhosis likely due to Jordan Le.  We will provide her with a prescription for Twinrix vaccination of hepatitis A and B.  Follow-up in 6 weeks.

## 2018-11-23 NOTE — Assessment & Plan Note (Signed)
Previously admitted with anasarca.  Lower extremity edema is much improved.  She continues to wrap her ankles and lower extremities.  Some mild persistent lower extremity edema is apparent but much improved.  Follow-up labs and imaging as per above.  Recommended again 2 g sodium diet, which the patient states she is currently following.  Ankles and keep feet elevated extremity edema.  Follow-up in 6 weeks.

## 2018-11-23 NOTE — Telephone Encounter (Signed)
Para scheduled for 11/25/18 at 2:15pm, arrive at 2:00pm. Tried to call pt, she was asleep. Granddaughter was with her following her procedure. Appt given to granddaughter. Informed her to hold diuretics day of para and low salt diet.

## 2018-11-23 NOTE — Assessment & Plan Note (Signed)
History of known cirrhosis based on sequela and imaging suggestive of nodular liver.  After her hysterectomy in March she was told she only had fatty liver.  Query possibility of liver disease in her family.  No alcohol.  Some abdominal swelling and discomfort/bloating.  Lower extremity edema intermittent and much better than when in the hospital.  No EGD previously.  Eats minimal to no salt.  No appetite because of bloating and things not tasting good.  Has never had hepatitis A or B vaccines.  No other GI complaints.  At this point we will follow-up with CBC, CMP, INR, ferritin, iron.  Will check abdominal ultrasound and elastography to confirm cirrhosis.  EGD for variceal screening.  2 g sodium diet.  We will give her prescription for hepatitis A/B vaccine.  Given her meld score we will consider possible need for hepatology referral based on follow-up labs.  Follow-up in 6 weeks.

## 2018-11-23 NOTE — Telephone Encounter (Signed)
COMPLETE paracentesis WITH ALBUMIN PROTOCOL THIS WEEK. PT SHOULD HOLD DIURETICS ON THE DAY OF THE PARACENTESIS. STRICTLY FOLLOW A LOW SALT DIET.  REFER TO [facility] IN [place] FOR LIVER TRANSPLANT EVALUATION, NA:TFTD CIRRHOSIS.  FOLLOW UP OFFICE VISIT IN FEB 2021.  REPEAT EGD IN 2 YEARS.

## 2018-11-23 NOTE — Addendum Note (Signed)
Addended by: Hassan Rowan on: 11/23/2018 03:42 PM   Modules accepted: Orders

## 2018-11-23 NOTE — Telephone Encounter (Signed)
Referral faxed to [facility].

## 2018-11-23 NOTE — H&P (Signed)
Primary Care Physician:  Manon Hilding, MD Primary Gastroenterologist:  Dr. Oneida Alar  Pre-Procedure History & Physical: HPI:  Jordan Le is a 73 y.o. female here for VARICEAL SCREENING.  Past Medical History:  Diagnosis Date  . Arthritis   . Cancer [facility])    endometrial cancer  . Cirrhosis of liver ([place])   . Diabetes mellitus without complication ([place])    type 2  . High cholesterol   . Hypertension   . Mini stroke ([place])    2011  . Pneumonia   . Renal disorder     Past Surgical History:  Procedure Laterality Date  . ABDOMINAL HYSTERECTOMY    . BREAST BIOPSY Right    ~15 years ago-2005  . OTHER SURGICAL HISTORY  2009   Uterine Polyp removal  . polyps removed from the uterus    . ROBOTIC ASSISTED TOTAL HYSTERECTOMY WITH BILATERAL SALPINGO OOPHERECTOMY Bilateral 05/07/2018   Procedure: XI ROBOTIC ASSISTED TOTAL HYSTERECTOMY WITH BILATERAL SALPINGO OOPHORECTOMY PELVIC LYMPHANECTOMY;  Surgeon: Everitt Amber, MD;  Location: [name];  Service: Gynecology;  Laterality: Bilateral;  . SENTINEL NODE BIOPSY Bilateral 05/07/2018   Procedure: SENTINEL NODE BIOPSY;  Surgeon: Everitt Amber, MD;  Location: [name];  Service: Gynecology;  Laterality: Bilateral;    Prior to Admission medications   Medication Sig Start Date End Date Taking? Authorizing Provider  aspirin EC 81 MG tablet Take 1 tablet (81 mg total) by mouth daily with breakfast. Patient taking differently: Take 81 mg by mouth every evening.  11/05/18  Yes Emokpae, Courage, MD  atorvastatin (LIPITOR) 10 MG tablet Take 1 tablet (10 mg total) by mouth daily at 6 PM. 11/05/18  Yes Emokpae, Courage, MD  diltiazem (CARDIZEM CD) 120 MG 24 hr capsule Take 1 capsule (120 mg total) by mouth daily. 11/05/18 11/05/19 Yes Emokpae, Courage, MD  feeding supplement, ENSURE ENLIVE, (ENSURE ENLIVE) LIQD Take 237 mLs by mouth 2 (two) times daily between meals. Patient taking differently: Take 237 mLs by mouth daily at 2 PM.  11/05/18  Yes Emokpae,  Courage, MD  glycerin adult 2 g suppository Place 1 suppository rectally as needed for constipation.   Yes [provider]  levothyroxine (SYNTHROID) 75 MCG tablet Take 75 mcg by mouth daily before breakfast.  10/05/18 10/30/19 Yes [provider]  Multiple Vitamin (MULTIVITAMIN WITH MINERALS) TABS tablet Take 1 tablet by mouth daily. 11/06/18  Yes Emokpae, Courage, MD  nystatin (MYCOSTATIN/NYSTOP) powder Apply topically 3 (three) times daily. Patient taking differently: Apply 1 g topically daily at 2 PM.  11/05/18  Yes Emokpae, Courage, MD  spironolactone (ALDACTONE) 25 MG tablet Take 1 tablet (25 mg total) by mouth daily. 11/05/18 06/03/19 Yes Emokpae, Courage, MD  torsemide (DEMADEX) 20 MG tablet Take 3 tablets (60 mg total) by mouth 2 (two) times daily. 11/05/18  Yes Roxan Hockey, MD    Allergies as of 11/16/2018  . (No Known Allergies)    Family History  Problem Relation Age of Onset  . Lung cancer Mother   . Hypertension Father   . Pancreatic cancer Brother   . Liver disease Neg Hx     Social History   Socioeconomic History  . Marital status: Married    Spouse name: Not on file  . Number of children: Not on file  . Years of education: Not on file  . Highest education level: Not on file  Occupational History  . Not on file  Social Needs  . Financial resource strain: Not on  file  . Food insecurity    Worry: Not on file    Inability: Not on file  . Transportation needs    Medical: Not on file    Non-medical: Not on file  Tobacco Use  . Smoking status: Never Smoker  . Smokeless tobacco: Never Used  Substance and Sexual Activity  . Alcohol use: Never    Frequency: Never  . Drug use: Never  . Sexual activity: Not Currently  Lifestyle  . Physical activity    Days per week: Not on file    Minutes per session: Not on file  . Stress: Not on file  Relationships  . Social Herbalist on phone: Not on file    Gets together: Not on file     Attends religious service: Not on file    Active member of club or organization: Not on file    Attends meetings of clubs or organizations: Not on file    Relationship status: Not on file  . Intimate partner violence    Fear of current or ex partner: Not on file    Emotionally abused: Not on file    Physically abused: Not on file    Forced sexual activity: Not on file  Other Topics Concern  . Not on file  Social History Narrative  . Not on file    Review of Systems: See HPI, otherwise negative ROS   Physical Exam: BP 132/66   Pulse (!) 116   Temp 98.1 F (36.7 C) (Oral)   Resp (!) 26   Ht 5\' 3"  (1.6 m)   Wt 83.9 kg   SpO2 96%   BMI 32.77 kg/m  General:   Alert,  pleasant and cooperative in NAD Head:  Normocephalic and atraumatic. Neck:  Supple; Lungs:  Clear throughout to auscultation.    Heart:  Regular rate and rhythm. Abdomen:  Soft, nontender and nondistended. Normal bowel sounds, without guarding, and without rebound.   Neurologic:  Alert and  oriented x4;  grossly normal neurologically.  Impression/Plan:     VARICES  PLAN: SCREENING EGD, ? POSSIBLE BANDING.  DISCUSSED PROCEDURE, BENEFITS, & RISKS: < 1% chance of medication reaction, bleeding, perforation, or ASPIRATION.

## 2018-11-24 NOTE — Telephone Encounter (Signed)
ON RECALL AND SCHEDULED FOR FOLLOW UP  °

## 2018-11-25 ENCOUNTER — Encounter (HOSPITAL_COMMUNITY): Payer: Self-pay

## 2018-11-25 ENCOUNTER — Other Ambulatory Visit: Payer: Self-pay

## 2018-11-25 ENCOUNTER — Ambulatory Visit (HOSPITAL_COMMUNITY)
Admission: RE | Admit: 2018-11-25 | Discharge: 2018-11-25 | Disposition: A | Discharge status: Home / Self Care | Payer: PPO | Source: Ambulatory Visit | Attending: Gastroenterology | Admitting: Gastroenterology

## 2018-11-25 ENCOUNTER — Other Ambulatory Visit (HOSPITAL_COMMUNITY): Payer: Self-pay | Admitting: Family Medicine

## 2018-11-25 DIAGNOSIS — K7460 Unspecified cirrhosis of liver: Secondary | ICD-10-CM | POA: Diagnosis not present

## 2018-11-25 DIAGNOSIS — R188 Other ascites: Secondary | ICD-10-CM | POA: Diagnosis not present

## 2018-11-25 NOTE — Procedures (Signed)
PreOperative Dx: Cirrhosis, ascites °Postoperative Dx: Cirrhosis, ascites °Procedure:   US guided paracentesis °Radiologist:  Dijuan Sleeth °Anesthesia:  10 ml of1% lidocaine °Specimen:  4 L of yellow ascitic fluid °EBL:   < 1 ml °Complications: None   °

## 2018-11-25 NOTE — Progress Notes (Signed)
Paracentesis complete no signs of distress.  

## 2018-11-26 ENCOUNTER — Encounter (HOSPITAL_COMMUNITY): Payer: Self-pay | Admitting: Gastroenterology

## 2018-11-26 NOTE — Progress Notes (Signed)
CC'D TO PCP °

## 2018-11-27 ENCOUNTER — Other Ambulatory Visit: Payer: Self-pay | Admitting: Nurse Practitioner

## 2018-11-27 ENCOUNTER — Telehealth: Payer: Self-pay | Admitting: *Deleted

## 2018-11-27 DIAGNOSIS — K760 Fatty (change of) liver, not elsewhere classified: Secondary | ICD-10-CM

## 2018-11-27 DIAGNOSIS — K7460 Unspecified cirrhosis of liver: Secondary | ICD-10-CM

## 2018-11-27 NOTE — Telephone Encounter (Signed)
Attempted to call the patient back to schedule an appt. No answer

## 2018-12-01 NOTE — Progress Notes (Signed)
Pt is aware of the results and plan. I am mailing the lab orders for the liver test in one week.  Also, forwarding to [facility] to schedule the MRI.

## 2018-12-02 ENCOUNTER — Ambulatory Visit: Payer: PPO | Admitting: Gastroenterology

## 2018-12-03 ENCOUNTER — Telehealth: Payer: Self-pay | Admitting: Gastroenterology

## 2018-12-03 ENCOUNTER — Other Ambulatory Visit: Payer: Self-pay

## 2018-12-03 ENCOUNTER — Inpatient Hospital Stay (HOSPITAL_COMMUNITY)
Admission: EM | Admit: 2018-12-03 | Discharge: 2018-12-08 | DRG: 442 | Disposition: A | Discharge status: Home Health | Payer: PPO | Attending: Internal Medicine | Admitting: Internal Medicine

## 2018-12-03 ENCOUNTER — Emergency Department (HOSPITAL_COMMUNITY): Payer: PPO

## 2018-12-03 ENCOUNTER — Encounter (HOSPITAL_COMMUNITY): Payer: Self-pay | Admitting: Emergency Medicine

## 2018-12-03 DIAGNOSIS — K5900 Constipation, unspecified: Secondary | ICD-10-CM | POA: Diagnosis present

## 2018-12-03 DIAGNOSIS — E785 Hyperlipidemia, unspecified: Secondary | ICD-10-CM | POA: Diagnosis not present

## 2018-12-03 DIAGNOSIS — R000 Tachycardia, unspecified: Secondary | ICD-10-CM | POA: Diagnosis not present

## 2018-12-03 DIAGNOSIS — I10 Essential (primary) hypertension: Secondary | ICD-10-CM | POA: Diagnosis not present

## 2018-12-03 DIAGNOSIS — I471 Supraventricular tachycardia: Secondary | ICD-10-CM | POA: Diagnosis not present

## 2018-12-03 DIAGNOSIS — K7200 Acute and subacute hepatic failure without coma: Secondary | ICD-10-CM | POA: Diagnosis not present

## 2018-12-03 DIAGNOSIS — E860 Dehydration: Secondary | ICD-10-CM | POA: Diagnosis not present

## 2018-12-03 DIAGNOSIS — Z7982 Long term (current) use of aspirin: Secondary | ICD-10-CM | POA: Diagnosis not present

## 2018-12-03 DIAGNOSIS — K766 Portal hypertension: Secondary | ICD-10-CM | POA: Diagnosis not present

## 2018-12-03 DIAGNOSIS — E7801 Familial hypercholesterolemia: Secondary | ICD-10-CM | POA: Diagnosis not present

## 2018-12-03 DIAGNOSIS — E1129 Type 2 diabetes mellitus with other diabetic kidney complication: Secondary | ICD-10-CM | POA: Diagnosis not present

## 2018-12-03 DIAGNOSIS — E1122 Type 2 diabetes mellitus with diabetic chronic kidney disease: Secondary | ICD-10-CM | POA: Diagnosis present

## 2018-12-03 DIAGNOSIS — E8809 Other disorders of plasma-protein metabolism, not elsewhere classified: Secondary | ICD-10-CM | POA: Diagnosis present

## 2018-12-03 DIAGNOSIS — D689 Coagulation defect, unspecified: Secondary | ICD-10-CM | POA: Diagnosis not present

## 2018-12-03 DIAGNOSIS — E1151 Type 2 diabetes mellitus with diabetic peripheral angiopathy without gangrene: Secondary | ICD-10-CM | POA: Diagnosis not present

## 2018-12-03 DIAGNOSIS — Z23 Encounter for immunization: Secondary | ICD-10-CM

## 2018-12-03 DIAGNOSIS — R188 Other ascites: Secondary | ICD-10-CM | POA: Diagnosis present

## 2018-12-03 DIAGNOSIS — K7581 Nonalcoholic steatohepatitis (NASH): Secondary | ICD-10-CM | POA: Diagnosis not present

## 2018-12-03 DIAGNOSIS — E1165 Type 2 diabetes mellitus with hyperglycemia: Secondary | ICD-10-CM | POA: Diagnosis present

## 2018-12-03 DIAGNOSIS — Z90710 Acquired absence of both cervix and uterus: Secondary | ICD-10-CM

## 2018-12-03 DIAGNOSIS — R159 Full incontinence of feces: Secondary | ICD-10-CM | POA: Diagnosis present

## 2018-12-03 DIAGNOSIS — R896 Abnormal cytological findings in specimens from other organs, systems and tissues: Secondary | ICD-10-CM | POA: Diagnosis not present

## 2018-12-03 DIAGNOSIS — R5383 Other fatigue: Secondary | ICD-10-CM | POA: Diagnosis not present

## 2018-12-03 DIAGNOSIS — E872 Acidosis, unspecified: Secondary | ICD-10-CM

## 2018-12-03 DIAGNOSIS — Z8673 Personal history of transient ischemic attack (TIA), and cerebral infarction without residual deficits: Secondary | ICD-10-CM | POA: Diagnosis not present

## 2018-12-03 DIAGNOSIS — I129 Hypertensive chronic kidney disease with stage 1 through stage 4 chronic kidney disease, or unspecified chronic kidney disease: Secondary | ICD-10-CM | POA: Diagnosis present

## 2018-12-03 DIAGNOSIS — I872 Venous insufficiency (chronic) (peripheral): Secondary | ICD-10-CM | POA: Diagnosis present

## 2018-12-03 DIAGNOSIS — K2980 Duodenitis without bleeding: Secondary | ICD-10-CM | POA: Diagnosis not present

## 2018-12-03 DIAGNOSIS — K2970 Gastritis, unspecified, without bleeding: Secondary | ICD-10-CM | POA: Diagnosis not present

## 2018-12-03 DIAGNOSIS — Z79899 Other long term (current) drug therapy: Secondary | ICD-10-CM | POA: Diagnosis not present

## 2018-12-03 DIAGNOSIS — K7460 Unspecified cirrhosis of liver: Secondary | ICD-10-CM | POA: Diagnosis not present

## 2018-12-03 DIAGNOSIS — E039 Hypothyroidism, unspecified: Secondary | ICD-10-CM | POA: Diagnosis present

## 2018-12-03 DIAGNOSIS — R531 Weakness: Secondary | ICD-10-CM | POA: Diagnosis not present

## 2018-12-03 DIAGNOSIS — T502X5A Adverse effect of carbonic-anhydrase inhibitors, benzothiadiazides and other diuretics, initial encounter: Secondary | ICD-10-CM | POA: Diagnosis present

## 2018-12-03 DIAGNOSIS — K219 Gastro-esophageal reflux disease without esophagitis: Secondary | ICD-10-CM | POA: Diagnosis not present

## 2018-12-03 DIAGNOSIS — Z79890 Hormone replacement therapy (postmenopausal): Secondary | ICD-10-CM | POA: Diagnosis not present

## 2018-12-03 DIAGNOSIS — K7682 Hepatic encephalopathy: Secondary | ICD-10-CM | POA: Diagnosis present

## 2018-12-03 DIAGNOSIS — Z8542 Personal history of malignant neoplasm of other parts of uterus: Secondary | ICD-10-CM | POA: Diagnosis not present

## 2018-12-03 DIAGNOSIS — N183 Chronic kidney disease, stage 3 (moderate): Secondary | ICD-10-CM | POA: Diagnosis present

## 2018-12-03 DIAGNOSIS — R4182 Altered mental status, unspecified: Secondary | ICD-10-CM | POA: Diagnosis not present

## 2018-12-03 DIAGNOSIS — D638 Anemia in other chronic diseases classified elsewhere: Secondary | ICD-10-CM | POA: Diagnosis present

## 2018-12-03 DIAGNOSIS — K3189 Other diseases of stomach and duodenum: Secondary | ICD-10-CM | POA: Diagnosis not present

## 2018-12-03 DIAGNOSIS — K7290 Hepatic failure, unspecified without coma: Secondary | ICD-10-CM

## 2018-12-03 DIAGNOSIS — R945 Abnormal results of liver function studies: Secondary | ICD-10-CM | POA: Diagnosis not present

## 2018-12-03 DIAGNOSIS — Z20828 Contact with and (suspected) exposure to other viral communicable diseases: Secondary | ICD-10-CM | POA: Diagnosis not present

## 2018-12-03 DIAGNOSIS — E7800 Pure hypercholesterolemia, unspecified: Secondary | ICD-10-CM | POA: Diagnosis present

## 2018-12-03 DIAGNOSIS — R4020 Unspecified coma: Secondary | ICD-10-CM | POA: Diagnosis not present

## 2018-12-03 LAB — URINALYSIS, ROUTINE W REFLEX MICROSCOPIC
Bilirubin Urine: NEGATIVE
Glucose, UA: NEGATIVE mg/dL
Ketones, ur: NEGATIVE mg/dL
Leukocytes,Ua: NEGATIVE
Nitrite: NEGATIVE
Protein, ur: NEGATIVE mg/dL
Specific Gravity, Urine: 1.011 (ref 1.005–1.030)
pH: 5 (ref 5.0–8.0)

## 2018-12-03 LAB — LIPASE, BLOOD: Lipase: 39 U/L (ref 11–51)

## 2018-12-03 LAB — COMPREHENSIVE METABOLIC PANEL
ALT: 37 U/L (ref 0–44)
AST: 77 U/L — ABNORMAL HIGH (ref 15–41)
Albumin: 3 g/dL — ABNORMAL LOW (ref 3.5–5.0)
Alkaline Phosphatase: 84 U/L (ref 38–126)
Anion gap: 12 (ref 5–15)
BUN: 35 mg/dL — ABNORMAL HIGH (ref 8–23)
CO2: 25 mmol/L (ref 22–32)
Calcium: 8.8 mg/dL — ABNORMAL LOW (ref 8.9–10.3)
Chloride: 94 mmol/L — ABNORMAL LOW (ref 98–111)
Creatinine, Ser: 1.73 mg/dL — ABNORMAL HIGH (ref 0.44–1.00)
GFR calc Af Amer: 33 mL/min — ABNORMAL LOW (ref >60)
GFR calc non Af Amer: 29 mL/min — ABNORMAL LOW (ref >60)
Glucose, Bld: 160 mg/dL — ABNORMAL HIGH (ref 70–99)
Potassium: 4 mmol/L (ref 3.5–5.1)
Sodium: 131 mmol/L — ABNORMAL LOW (ref 135–145)
Total Bilirubin: 4.6 mg/dL — ABNORMAL HIGH (ref 0.3–1.2)
Total Protein: 7.3 g/dL (ref 6.5–8.1)

## 2018-12-03 LAB — CBC
HCT: 34.2 % — ABNORMAL LOW (ref 36.0–46.0)
Hemoglobin: 11.1 g/dL — ABNORMAL LOW (ref 12.0–15.0)
MCH: 31.4 pg (ref 26.0–34.0)
MCHC: 32.5 g/dL (ref 30.0–36.0)
MCV: 96.6 fL (ref 80.0–100.0)
Platelets: 201 10*3/uL (ref 150–400)
RBC: 3.54 MIL/uL — ABNORMAL LOW (ref 3.87–5.11)
RDW: 15.9 % — ABNORMAL HIGH (ref 11.5–15.5)
WBC: 14.3 10*3/uL — ABNORMAL HIGH (ref 4.0–10.5)
nRBC: 0 % (ref 0.0–0.2)

## 2018-12-03 LAB — SARS CORONAVIRUS 2 BY RT PCR (HOSPITAL ORDER, PERFORMED IN ~~LOC~~ HOSPITAL LAB): SARS Coronavirus 2: NEGATIVE

## 2018-12-03 LAB — LACTIC ACID, PLASMA
Lactic Acid, Venous: 4.1 mmol/L (ref 0.5–1.9)
Lactic Acid, Venous: 2.8 mmol/L (ref 0.5–1.9)

## 2018-12-03 LAB — TROPONIN I (HIGH SENSITIVITY)
Troponin I (High Sensitivity): 9 ng/L (ref <18)
Troponin I (High Sensitivity): 9 ng/L (ref <18)

## 2018-12-03 LAB — CBG MONITORING, ED: Glucose-Capillary: 157 mg/dL — ABNORMAL HIGH (ref 70–99)

## 2018-12-03 LAB — AMMONIA: Ammonia: 95 umol/L — ABNORMAL HIGH (ref 9–35)

## 2018-12-03 MED ORDER — LEVOTHYROXINE SODIUM 75 MCG PO TABS
75.0000 ug | ORAL_TABLET | Freq: Every day | ORAL | Status: DC
  Administered 2018-12-04 – 2018-12-08 (×5): 75 ug via ORAL
  Filled 2018-12-03: qty 1.5
  Filled 2018-12-03 (×4): qty 1
  Filled 2018-12-03: qty 3

## 2018-12-03 MED ORDER — VANCOMYCIN HCL IN DEXTROSE 1-5 GM/200ML-% IV SOLN
1000.0000 mg | Freq: Once | INTRAVENOUS | Status: AC
  Administered 2018-12-03: 14:00:00 1000 mg via INTRAVENOUS

## 2018-12-03 MED ORDER — SODIUM CHLORIDE 0.9 % IV SOLN: Freq: Once | INTRAVENOUS | Status: DC

## 2018-12-03 MED ORDER — VANCOMYCIN HCL IN DEXTROSE 1-5 GM/200ML-% IV SOLN: 1000.0000 mg | INTRAVENOUS | Status: DC

## 2018-12-03 MED ORDER — VANCOMYCIN HCL IN DEXTROSE 1-5 GM/200ML-% IV SOLN
1000.0000 mg | Freq: Once | INTRAVENOUS | Status: DC
  Filled 2018-12-03: qty 200

## 2018-12-03 MED ORDER — SODIUM CHLORIDE 0.9 % IV BOLUS
1000.0000 mL | Freq: Once | INTRAVENOUS | Status: AC
  Administered 2018-12-03: 16:00:00 1000 mL via INTRAVENOUS

## 2018-12-03 MED ORDER — HYDROCODONE-ACETAMINOPHEN 5-325 MG PO TABS
1.0000 | ORAL_TABLET | Freq: Four times a day (QID) | ORAL | Status: DC | PRN
  Administered 2018-12-03 – 2018-12-04 (×2): 2 via ORAL
  Filled 2018-12-03 (×2): qty 2

## 2018-12-03 MED ORDER — SODIUM CHLORIDE 0.9% FLUSH: 3.0000 mL | Freq: Once | INTRAVENOUS | Status: DC

## 2018-12-03 MED ORDER — DILTIAZEM HCL ER COATED BEADS 120 MG PO CP24
120.0000 mg | ORAL_CAPSULE | Freq: Every day | ORAL | Status: DC
  Administered 2018-12-03 – 2018-12-08 (×6): 120 mg via ORAL
  Filled 2018-12-03 (×7): qty 1

## 2018-12-03 MED ORDER — CHLORHEXIDINE GLUCONATE CLOTH 2 % EX PADS
6.0000 | MEDICATED_PAD | Freq: Every day | CUTANEOUS | Status: DC
  Administered 2018-12-03 – 2018-12-08 (×6): 6 via TOPICAL

## 2018-12-03 MED ORDER — ONDANSETRON HCL 4 MG PO TABS
4.0000 mg | ORAL_TABLET | Freq: Four times a day (QID) | ORAL | Status: DC | PRN
  Administered 2018-12-04: 10:00:00 4 mg via ORAL
  Filled 2018-12-03: qty 1

## 2018-12-03 MED ORDER — SODIUM CHLORIDE 0.9 % IV SOLN
2.0000 g | Freq: Once | INTRAVENOUS | Status: AC
  Administered 2018-12-03: 13:00:00 2 g via INTRAVENOUS
  Filled 2018-12-03: qty 2

## 2018-12-03 MED ORDER — ADULT MULTIVITAMIN W/MINERALS CH
1.0000 | ORAL_TABLET | Freq: Every day | ORAL | Status: DC
  Administered 2018-12-04 – 2018-12-08 (×5): 1 via ORAL
  Filled 2018-12-03 (×5): qty 1

## 2018-12-03 MED ORDER — LACTULOSE 10 GM/15ML PO SOLN
10.0000 g | Freq: Three times a day (TID) | ORAL | Status: DC
  Administered 2018-12-03 – 2018-12-06 (×9): 10 g via ORAL
  Filled 2018-12-03 (×9): qty 30

## 2018-12-03 MED ORDER — NYSTATIN 100000 UNIT/GM EX POWD
Freq: Three times a day (TID) | CUTANEOUS | Status: DC
  Administered 2018-12-03 – 2018-12-08 (×14): via TOPICAL
  Filled 2018-12-03 (×2): qty 15

## 2018-12-03 MED ORDER — LACTULOSE ENEMA
300.0000 mL | Freq: Once | ORAL | Status: AC
  Administered 2018-12-03: 300 mL via RECTAL
  Filled 2018-12-03: qty 300

## 2018-12-03 MED ORDER — PANTOPRAZOLE SODIUM 40 MG PO TBEC
40.0000 mg | DELAYED_RELEASE_TABLET | Freq: Every day | ORAL | Status: DC
  Administered 2018-12-04 – 2018-12-08 (×5): 40 mg via ORAL
  Filled 2018-12-03 (×5): qty 1

## 2018-12-03 MED ORDER — ENSURE ENLIVE PO LIQD
237.0000 mL | Freq: Every day | ORAL | Status: DC
  Administered 2018-12-04 – 2018-12-07 (×4): 237 mL via ORAL

## 2018-12-03 MED ORDER — HEPARIN SODIUM (PORCINE) 5000 UNIT/ML IJ SOLN
5000.0000 [IU] | Freq: Three times a day (TID) | INTRAMUSCULAR | Status: DC
  Administered 2018-12-03 – 2018-12-04 (×2): 5000 [IU] via SUBCUTANEOUS
  Filled 2018-12-03 (×2): qty 1

## 2018-12-03 MED ORDER — SODIUM CHLORIDE 0.9 % IV BOLUS
1000.0000 mL | Freq: Once | INTRAVENOUS | Status: AC
  Administered 2018-12-03: 1000 mL via INTRAVENOUS

## 2018-12-03 MED ORDER — SODIUM CHLORIDE 0.9 % IV SOLN
INTRAVENOUS | Status: AC
  Administered 2018-12-03: 17:00:00 via INTRAVENOUS

## 2018-12-03 MED ORDER — INFLUENZA VAC A&B SA ADJ QUAD 0.5 ML IM PRSY
0.5000 mL | PREFILLED_SYRINGE | INTRAMUSCULAR | Status: AC
  Administered 2018-12-04: 10:00:00 0.5 mL via INTRAMUSCULAR
  Filled 2018-12-03: qty 0.5

## 2018-12-03 MED ORDER — SODIUM CHLORIDE 0.9 % IV SOLN
1.0000 g | INTRAVENOUS | Status: DC
  Administered 2018-12-04 – 2018-12-05 (×2): 1 g via INTRAVENOUS
  Filled 2018-12-03 (×4): qty 1

## 2018-12-03 MED ORDER — METRONIDAZOLE IN NACL 5-0.79 MG/ML-% IV SOLN
500.0000 mg | Freq: Once | INTRAVENOUS | Status: AC
  Administered 2018-12-03: 500 mg via INTRAVENOUS
  Filled 2018-12-03: qty 100

## 2018-12-03 MED ORDER — VANCOMYCIN HCL IN DEXTROSE 1-5 GM/200ML-% IV SOLN: 1000.0000 mg | Freq: Once | INTRAVENOUS | Status: DC

## 2018-12-03 MED ORDER — ATORVASTATIN CALCIUM 10 MG PO TABS
10.0000 mg | ORAL_TABLET | Freq: Every day | ORAL | Status: DC
  Administered 2018-12-03 – 2018-12-07 (×5): 10 mg via ORAL
  Filled 2018-12-03 (×5): qty 1

## 2018-12-03 MED ORDER — ONDANSETRON HCL 4 MG/2ML IJ SOLN
4.0000 mg | Freq: Four times a day (QID) | INTRAMUSCULAR | Status: DC | PRN
  Administered 2018-12-04: 4 mg via INTRAVENOUS
  Filled 2018-12-03: qty 2

## 2018-12-03 MED ORDER — SODIUM CHLORIDE 0.9 % IV BOLUS
1000.0000 mL | Freq: Once | INTRAVENOUS | Status: AC
  Administered 2018-12-03: 12:00:00 1000 mL via INTRAVENOUS

## 2018-12-03 MED ORDER — ASPIRIN EC 81 MG PO TBEC
81.0000 mg | DELAYED_RELEASE_TABLET | Freq: Every evening | ORAL | Status: DC
  Administered 2018-12-03 – 2018-12-06 (×4): 81 mg via ORAL
  Filled 2018-12-03 (×3): qty 1

## 2018-12-03 NOTE — Progress Notes (Signed)
Pharmacy Antibiotic Note  Jordan Le is a 73 y.o. female admitted on 12/03/2018 with unknown source of infection.  Pharmacy has been consulted for Vancomycin and Cefepime dosing.  Plan:  Vancomycin 2000 mg IV x 1 dose. Vancomycin 1000 mg IV every 24 hours.  Goal trough 15-20 mcg/mL. Cefepime 1000 mg IV every 24 hours. Monitor labs, c/s, and vanco level as indicated.   Temp (24hrs), Avg:97.9 F (36.6 C), Min:97.9 F (36.6 C), Max:97.9 F (36.6 C)  Recent Labs  Lab 12/03/18 1054  WBC 14.3*  CREATININE 1.73*  LATICACIDVEN 4.1*    Estimated Creatinine Clearance: 29.3 mL/min (A) (by C-G formula based on SCr of 1.73 mg/dL (H)).    No Known Allergies  Antimicrobials this admission: Vanco 9/10 >>  Cefepime 9/10 >>   Dose adjustments this admission: Vanco/Cefepime  Microbiology results: 9/10 BCx: pending   Thank you for allowing pharmacy to be a part of this patient's care.  Ramond Craver 12/03/2018 12:27 PM

## 2018-12-03 NOTE — ED Provider Notes (Signed)
[facility] EMERGENCY DEPARTMENT Provider Note   CSN: [id number] Arrival date & time: 12/03/18  1029     History   Chief Complaint Chief Complaint  Patient presents with   Altered Mental Status    HPI Jordan Le is a 73 y.o. female.     HPI   Jordan Le is a 73 y.o. female with past medical history of diabetes, hyper lipidemia, TIAs, hypertension, and recently diagnosed liver cirrhosis.  she presents to the Emergency Department with her daughter who noticed mental changes with her mother this morning.  Last known normal was 830 last evening.  Daughter states that she came to her mother's house this morning to take her to her doctor appointment and noticed that her mother was confused and disoriented.  She states that she had been incontinent of stool and could not answer her questions appropriately.  She has been recently diagnosed with liver cirrhosis and was noted to have elevated bilirubin at the end of August.  She was admitted here for anasarca in August.  Daughter denies new medications, recent illness, fever or vomiting.    Past Medical History:  Diagnosis Date   Arthritis    Cancer [facility])    endometrial cancer   Cirrhosis of liver ([place])    Diabetes mellitus without complication ([place])    type 2   High cholesterol    Hypertension    Mini stroke ([place])    2011   Pneumonia    Renal disorder     Patient Active Problem List   Diagnosis Date Noted   Hepatitis B vaccination not up to date 11/16/2018   SVT (supraventricular tachycardia) ([place]) 10/27/2018   AKI (acute kidney injury) ([place])    Anasarca    Pleural effusion on left    Acute renal failure (ARF) ([place]) 10/25/2018   Essential hypertension 10/25/2018   SOB (shortness of breath) 10/25/2018   Acute lower UTI 10/25/2018   Diabetes mellitus without complication ([place])    Cirrhosis of liver with ascites ([place])    High cholesterol    Mini stroke ([place])    Endometrial cancer ([place])  05/07/2018   Urinary tract infection 05/07/2018    Past Surgical History:  Procedure Laterality Date   ABDOMINAL HYSTERECTOMY     BIOPSY  11/23/2018   Procedure: BIOPSY;  Surgeon: Danie Binder, MD;  Location: [facility];  Service: Endoscopy;;   BREAST BIOPSY Right    ~15 years ago-2005   ESOPHAGOGASTRODUODENOSCOPY N/A 11/23/2018   Procedure: ESOPHAGOGASTRODUODENOSCOPY (EGD);  Surgeon: Danie Binder, MD;  Location: [facility];  Service: Endoscopy;  Laterality: N/A;  1:00pm   OTHER SURGICAL HISTORY  2009   Uterine Polyp removal   polyps removed from the uterus     ROBOTIC ASSISTED TOTAL HYSTERECTOMY WITH BILATERAL SALPINGO OOPHERECTOMY Bilateral 05/07/2018   Procedure: XI ROBOTIC ASSISTED TOTAL HYSTERECTOMY WITH BILATERAL SALPINGO OOPHORECTOMY PELVIC LYMPHANECTOMY;  Surgeon: Everitt Amber, MD;  Location: [name];  Service: Gynecology;  Laterality: Bilateral;   SENTINEL NODE BIOPSY Bilateral 05/07/2018   Procedure: SENTINEL NODE BIOPSY;  Surgeon: Everitt Amber, MD;  Location: [name];  Service: Gynecology;  Laterality: Bilateral;     OB History    Gravida  2   Para  2   Term  2   Preterm      AB      Living        SAB      TAB      Ectopic  Multiple      Live Births               Home Medications    Prior to Admission medications   Medication Sig Start Date End Date Taking? Authorizing Provider  aspirin EC 81 MG tablet Take 1 tablet (81 mg total) by mouth daily with breakfast. Patient taking differently: Take 81 mg by mouth every evening.  11/05/18   Roxan Hockey, MD  atorvastatin (LIPITOR) 10 MG tablet Take 1 tablet (10 mg total) by mouth daily at 6 PM. 11/05/18   Emokpae, Courage, MD  diltiazem (CARDIZEM CD) 120 MG 24 hr capsule Take 1 capsule (120 mg total) by mouth daily. 11/05/18 11/05/19  Roxan Hockey, MD  feeding supplement, ENSURE ENLIVE, (ENSURE ENLIVE) LIQD Take 237 mLs by mouth 2 (two) times daily between meals. Patient taking  differently: Take 237 mLs by mouth daily at 2 PM.  11/05/18   Emokpae, Courage, MD  glycerin adult 2 g suppository Place 1 suppository rectally as needed for constipation.    [provider]  levothyroxine (SYNTHROID) 75 MCG tablet Take 75 mcg by mouth daily before breakfast.  10/05/18 10/30/19  [provider]  Multiple Vitamin (MULTIVITAMIN WITH MINERALS) TABS tablet Take 1 tablet by mouth daily. 11/06/18   Roxan Hockey, MD  nystatin (MYCOSTATIN/NYSTOP) powder Apply topically 3 (three) times daily. Patient taking differently: Apply 1 g topically daily at 2 PM.  11/05/18   Emokpae, Courage, MD  omeprazole (PRILOSEC) 20 MG capsule 1 PO 30 MINS PRIOR TO BREAKFAST. 11/23/18   Fields, Marga Melnick, MD  spironolactone (ALDACTONE) 25 MG tablet Take 1 tablet (25 mg total) by mouth daily. 11/05/18 06/03/19  Roxan Hockey, MD  torsemide (DEMADEX) 20 MG tablet Take 3 tablets (60 mg total) by mouth 2 (two) times daily. 11/05/18   Roxan Hockey, MD    Family History Family History  Problem Relation Age of Onset   Lung cancer Mother    Hypertension Father    Pancreatic cancer Brother    Liver disease Neg Hx     Social History Social History   Tobacco Use   Smoking status: Never Smoker   Smokeless tobacco: Never Used  Substance Use Topics   Alcohol use: Never    Frequency: Never   Drug use: Never     Allergies   Patient has no known allergies.   Review of Systems Review of Systems  Constitutional: Negative for appetite change, chills and fever.  HENT: Negative for sore throat.   Respiratory: Negative for cough, chest tightness and shortness of breath.   Cardiovascular: Negative for chest pain and leg swelling.  Gastrointestinal: Positive for diarrhea. Negative for abdominal pain, nausea and vomiting.  Genitourinary: Negative for difficulty urinating, dysuria and flank pain.  Musculoskeletal: Negative for arthralgias and myalgias.  Skin: Negative for rash.   Neurological: Positive for weakness. Negative for dizziness and headaches.  Psychiatric/Behavioral: Positive for confusion.     Physical Exam Updated Vital Signs BP (!) 111/56    Pulse (!) 134    Temp 97.9 F (36.6 C) (Oral)    Resp (!) 21    SpO2 95%   Physical Exam Vitals signs and nursing note reviewed.  Constitutional:      Appearance: She is ill-appearing.  HENT:     Head: Atraumatic.     Mouth/Throat:     Mouth: Mucous membranes are dry.  Eyes:     Pupils: Pupils are equal, round, and reactive to light.  Neck:     Musculoskeletal: Normal range of motion. No neck rigidity.  Cardiovascular:     Rate and Rhythm: Regular rhythm. Tachycardia present.     Pulses: Normal pulses.  Pulmonary:     Effort: Pulmonary effort is normal. No respiratory distress.     Breath sounds: Normal breath sounds. No wheezing.  Abdominal:     General: There is distension.     Palpations: Abdomen is soft. There is no mass.     Tenderness: There is no abdominal tenderness. There is no guarding.  Musculoskeletal:        General: No tenderness.  Lymphadenopathy:     Cervical: No cervical adenopathy.  Skin:    General: Skin is warm.     Capillary Refill: Capillary refill takes less than 2 seconds.     Findings: No erythema or rash.  Neurological:     General: No focal deficit present.     Mental Status: She is alert.     Sensory: Sensation is intact.     Motor: No abnormal muscle tone.     Comments: CN II-III grossly intact.  Speech slow but clear.  No dysphasia.  Follows commands appropriately.  No unilateral weakness.       ED Treatments / Results  Labs (all labs ordered are listed, but only abnormal results are displayed) Labs Reviewed  COMPREHENSIVE METABOLIC PANEL - Abnormal; Notable for the following components:      Result Value   Sodium 131 (*)    Chloride 94 (*)    Glucose, Bld 160 (*)    BUN 35 (*)    Creatinine, Ser 1.73 (*)    Calcium 8.8 (*)    Albumin 3.0 (*)     AST 77 (*)    Total Bilirubin 4.6 (*)    GFR calc non Af Amer 29 (*)    GFR calc Af Amer 33 (*)    All other components within normal limits  CBC - Abnormal; Notable for the following components:   WBC 14.3 (*)    RBC 3.54 (*)    Hemoglobin 11.1 (*)    HCT 34.2 (*)    RDW 15.9 (*)    All other components within normal limits  AMMONIA - Abnormal; Notable for the following components:   Ammonia 95 (*)    All other components within normal limits  LACTIC ACID, PLASMA - Abnormal; Notable for the following components:   Lactic Acid, Venous 4.1 (*)    All other components within normal limits  CBG MONITORING, ED - Abnormal; Notable for the following components:   Glucose-Capillary 157 (*)    All other components within normal limits  CULTURE, BLOOD (ROUTINE X 2)  CULTURE, BLOOD (ROUTINE X 2)  SARS CORONAVIRUS 2 (HOSPITAL ORDER, [facility])  LIPASE, BLOOD  URINALYSIS, ROUTINE W REFLEX MICROSCOPIC  LACTIC ACID, PLASMA  TROPONIN I (HIGH SENSITIVITY)    EKG EKG Interpretation  Date/Time:  Thursday December 03 2018 10:51:37 EDT Ventricular Rate:  99 PR Interval:    QRS Duration: 104 QT Interval:  367 QTC Calculation: 471 R Axis:   113 Text Interpretation:  Sinus rhythm Lateral infarct, age indeterminate Probable anteroseptal infarct, old No STEMI  Confirmed by Nanda Quinton [id number]) on 12/03/2018 11:46:25 AM   Radiology Ct Head Wo Contrast  Result Date: 12/03/2018 CLINICAL DATA:  Altered level of consciousness. EXAM: CT HEAD WITHOUT CONTRAST TECHNIQUE: Contiguous axial images were obtained from the base of the skull  through the vertex without intravenous contrast. COMPARISON:  None. FINDINGS: Brain: No evidence of acute infarction, hemorrhage, hydrocephalus, extra-axial collection or mass lesion/mass effect. Mild brain parenchymal volume loss and deep white matter microangiopathy. Vascular: Calcific atherosclerotic disease of the intra cavernous carotid  arteries, mild. Skull: Normal. Negative for fracture or focal lesion. Sinuses/Orbits: No acute finding. Other: None. IMPRESSION: No acute intracranial abnormality. Electronically Signed   By: Fidela Salisbury M.D.   On: 12/03/2018 14:11   Dg Chest Portable 1 View  Result Date: 12/03/2018 CLINICAL DATA:  Acute change in mental status. EXAM: PORTABLE CHEST 1 VIEW COMPARISON:  October 25, 2018 FINDINGS: Cardiomediastinal silhouette is normal. Mediastinal contours appear intact. Calcific atherosclerotic disease of the aorta. There is no evidence of focal airspace consolidation, pleural effusion or pneumothorax. Low lung volumes. Osseous structures are without acute abnormality. Soft tissues are grossly normal. IMPRESSION: 1. No active disease. 2. Low lung volumes. Electronically Signed   By: Fidela Salisbury M.D.   On: 12/03/2018 12:11    Procedures Procedures (including critical care time)  Medications Ordered in ED Medications  sodium chloride flush (NS) 0.9 % injection 3 mL (has no administration in time range)  lactulose (CHRONULAC) enema 200 gm (has no administration in time range)  sodium chloride 0.9 % bolus 1,000 mL (has no administration in time range)  ceFEPIme (MAXIPIME) 2 g in sodium chloride 0.9 % 100 mL IVPB (has no administration in time range)  metroNIDAZOLE (FLAGYL) IVPB 500 mg (has no administration in time range)  vancomycin (VANCOCIN) IVPB 1000 mg/200 mL premix (has no administration in time range)  ceFEPIme (MAXIPIME) 1 g in sodium chloride 0.9 % 100 mL IVPB (has no administration in time range)     Initial Impression / Assessment and Plan / ED Course  I have reviewed the triage vital signs and the nursing notes.  Pertinent labs & imaging results that were available during my care of the patient were reviewed by me and considered in my medical decision making (see chart for details).        Pt with recently diagnosed liver cirrhosis.  Confused this morning per  daughter and was incontinent of stool.  Pt is somnolent here, follows commands.  No focal neuro deficit.  She is oriented to person and time only.  Abdomen is somewhat distended but nontender.  No definite ascites.  Patient discussed with Dr. Laverta Baltimore.  Ammonia level is elevated, no previous for comparison.  Confusion likely related to hepatic encephalopathy.  Will order lactulose.  Patient does not pass swallow screen so will order the lactulose to be administered rectally.  Initial lactic acid is elevated, will initiate antibiotics although this is felt to be related to dehydration.  No obvious source of infection on work-up today.  Second lactic pending,.  Patient is somewhat more responsive after fluids and lactulose.  Will consult for admission.  Consulted hospitalist, Dr. Manuella Ghazi who agrees to admit.      CRITICAL CARE Performed by: Lurlene Ronda Total critical care time: 30 minutes Critical care time was exclusive of separately billable procedures and treating other patients. Critical care was necessary to treat or prevent imminent or life-threatening deterioration. Critical care was time spent personally by me on the following activities: development of treatment plan with patient and/or surrogate as well as nursing, discussions with consultants, evaluation of patient's response to treatment, examination of patient, obtaining history from patient or surrogate, ordering and performing treatments and interventions, ordering and review of laboratory studies, ordering and  review of radiographic studies, pulse oximetry and re-evaluation of patient's condition.   Final Clinical Impressions(s) / ED Diagnoses   Final diagnoses:  Hepatic encephalopathy [facility])    ED Discharge Orders    None       Kem Parkinson, PA-C 12/03/18 1508    Margette Fast, MD 12/04/18 0745

## 2018-12-03 NOTE — ED Triage Notes (Signed)
Family reports acute change in mental status today. LKW last night. Family came to pick up patient for doctor's appt this am. Confused and lethargic. Patient has hx of cirrhosis.

## 2018-12-03 NOTE — H&P (Addendum)
History and Physical    Jordan Le PFX:[id number] DOB: [birth year] DOA: 12/03/2018  PCP: Manon Hilding, MD   Patient coming from: Home  Chief Complaint: AMS  HPI: Jordan Le is a 73 y.o. 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  Her last known normal was approximately 830 yesterday evening.  She was at her doctor's appointment for routine lab work this morning and was told to come to the emergency department on account of her confusion.  She is also noted to be incontinent of stool and usually struggles with severe constipation and uses suppositories on a regular basis.  She sees Dr. Oneida Alar as her gastroenterologist who recently diagnosed her with liver cirrhosis and was also concerned about elevated bilirubin at the end of August.   ED Course: Vital signs demonstrate ongoing sinus tachycardia with rates in the 120 to 130 bpm range and soft blood pressure readings of 329 systolic and 60 diastolic.  Lactic acid is noted to be 4.1, WBC count 14,300, sodium 131, hemoglobin 11.1, and creatinine 1.73.  CT of the head with no acute findings, 1 view chest x-ray with no acute findings.  Urine analysis with no signs of UTI noted.  EKG and 99 bpm with normal sinus rhythm.  She was given 1 L fluid bolus and started on Rocephin, Flagyl, and vancomycin empirically for sepsis coverage.  COVID testing negative.  I have instructed ED provider to please give 30 cc/kg fluid bolus due to concern for sepsis.  She was also given a lactulose enema due to ammonia level noted to be 95 and she appears to be having improvement in her mentation according to daughter at  bedside.  Review of Systems: All others reviewed and otherwise negative.  Past Medical History:  Diagnosis Date  . Arthritis   . Cancer [facility])    endometrial cancer  . Cirrhosis of liver ([place])   . Diabetes mellitus without complication ([place])    type 2  . High cholesterol   . Hypertension   . Mini stroke ([place])    2011  . Pneumonia   . Renal disorder     Past Surgical History:  Procedure Laterality Date  . ABDOMINAL HYSTERECTOMY    . BIOPSY  11/23/2018   Procedure: BIOPSY;  Surgeon: Danie Binder, MD;  Location: [facility];  Service: Endoscopy;;  . BREAST BIOPSY Right    ~15 years ago-2005  . ESOPHAGOGASTRODUODENOSCOPY N/A 11/23/2018   Procedure: ESOPHAGOGASTRODUODENOSCOPY (EGD);  Surgeon: Danie Binder, MD;  Location: [facility];  Service: Endoscopy;  Laterality: N/A;  1:00pm  . OTHER SURGICAL HISTORY  2009   Uterine Polyp removal  . polyps removed from the uterus    . ROBOTIC ASSISTED TOTAL HYSTERECTOMY WITH BILATERAL SALPINGO OOPHERECTOMY Bilateral 05/07/2018   Procedure: XI ROBOTIC ASSISTED TOTAL HYSTERECTOMY WITH BILATERAL SALPINGO OOPHORECTOMY PELVIC LYMPHANECTOMY;  Surgeon: Everitt Amber, MD;  Location: [name];  Service: Gynecology;  Laterality: Bilateral;  . SENTINEL NODE BIOPSY Bilateral 05/07/2018   Procedure: SENTINEL NODE BIOPSY;  Surgeon: Everitt Amber, MD;  Location: [name];  Service: Gynecology;  Laterality: Bilateral;     reports that she has never smoked. She has never used smokeless tobacco. She reports  that she does not drink alcohol or use drugs.  No Known Allergies  Family History  Problem Relation Age of Onset  . Lung cancer Mother   . Hypertension Father   . Pancreatic cancer Brother   . Liver disease Neg Hx     Prior to Admission medications   Medication Sig Start Date End Date Taking? Authorizing Provider  aspirin EC 81 MG tablet Take 1 tablet (81 mg total) by mouth daily with breakfast. Patient taking differently: Take 81 mg by mouth every  evening.  11/05/18   Roxan Hockey, MD  atorvastatin (LIPITOR) 10 MG tablet Take 1 tablet (10 mg total) by mouth daily at 6 PM. 11/05/18   Emokpae, Courage, MD  diltiazem (CARDIZEM CD) 120 MG 24 hr capsule Take 1 capsule (120 mg total) by mouth daily. 11/05/18 11/05/19  Roxan Hockey, MD  feeding supplement, ENSURE ENLIVE, (ENSURE ENLIVE) LIQD Take 237 mLs by mouth 2 (two) times daily between meals. Patient taking differently: Take 237 mLs by mouth daily at 2 PM.  11/05/18   Emokpae, Courage, MD  glycerin adult 2 g suppository Place 1 suppository rectally as needed for constipation.    [provider]  levothyroxine (SYNTHROID) 75 MCG tablet Take 75 mcg by mouth daily before breakfast.  10/05/18 10/30/19  [provider]  Multiple Vitamin (MULTIVITAMIN WITH MINERALS) TABS tablet Take 1 tablet by mouth daily. 11/06/18   Roxan Hockey, MD  nystatin (MYCOSTATIN/NYSTOP) powder Apply topically 3 (three) times daily. Patient taking differently: Apply 1 g topically daily at 2 PM.  11/05/18   Emokpae, Courage, MD  omeprazole (PRILOSEC) 20 MG capsule 1 PO 30 MINS PRIOR TO BREAKFAST. 11/23/18   Fields, Marga Melnick, MD  spironolactone (ALDACTONE) 25 MG tablet Take 1 tablet (25 mg total) by mouth daily. 11/05/18 06/03/19  Roxan Hockey, MD  torsemide (DEMADEX) 20 MG tablet Take 3 tablets (60 mg total) by mouth 2 (two) times daily. 11/05/18   Roxan Hockey, MD    Physical Exam: Vitals:   12/03/18 1100 12/03/18 1115 12/03/18 1130 12/03/18 1430  BP: (!) 118/45  (!) 111/56 107/62  Pulse: 97 95 (!) 134   Resp: (!) 27 (!) 21 (!) 21 (!) 26  Temp:      TempSrc:      SpO2: 100% 97% 95%     Constitutional: NAD, alert and responding to questioning Vitals:   12/03/18 1100 12/03/18 1115 12/03/18 1130 12/03/18 1430  BP: (!) 118/45  (!) 111/56 107/62  Pulse: 97 95 (!) 134   Resp: (!) 27 (!) 21 (!) 21 (!) 26  Temp:      TempSrc:      SpO2: 100% 97% 95%    Eyes: lids and conjunctivae normal  ENMT: Mucous membranes are moist.  Neck: normal, supple Respiratory: clear to auscultation bilaterally. Normal respiratory effort. No accessory muscle use.  Cardiovascular: Regular rate and rhythm-tachycardic, no murmurs. No extremity edema. Abdomen: no tenderness, no distention. Bowel sounds positive.  Ascites noted. Musculoskeletal:  No joint deformity upper and lower extremities.   Skin: no rashes, lesions, ulcers.  Left lower extremity stasis dermatitis changes noted. Psychiatric: Cannot be assessed given patient condition.  Labs on Admission: I have personally reviewed following labs and imaging studies  CBC: Recent Labs  Lab 12/03/18 1054  WBC 14.3*  HGB 11.1*  HCT 34.2*  MCV 96.6  PLT 989   Basic Metabolic Panel: Recent Labs  Lab 12/03/18 1054  NA 131*  K 4.0  CL 94*  CO2 25  GLUCOSE 160*  BUN 35*  CREATININE 1.73*  CALCIUM 8.8*   GFR: Estimated Creatinine Clearance: 29.3 mL/min (A) (by C-G formula based on SCr of 1.73 mg/dL (H)). Liver Function Tests: Recent Labs  Lab 12/03/18 1054  AST 77*  ALT 37  ALKPHOS 84  BILITOT 4.6*  PROT 7.3  ALBUMIN 3.0*   Recent Labs  Lab 12/03/18 1054  LIPASE 39   Recent Labs  Lab 12/03/18 1055  AMMONIA 95*   Coagulation Profile: No results for input(s): INR, PROTIME in the last 168 hours. Cardiac Enzymes: No results for input(s): CKTOTAL, CKMB, CKMBINDEX, TROPONINI in the last 168 hours. BNP (last 3 results) No results for input(s): PROBNP in the last 8760 hours. HbA1C: No results for input(s): HGBA1C in the last 72 hours. CBG: Recent Labs  Lab 12/03/18 1108  GLUCAP 157*   Lipid Profile: No results for input(s): CHOL, HDL, LDLCALC, TRIG, CHOLHDL, LDLDIRECT in the last 72 hours. Thyroid Function Tests: No results for input(s): TSH, T4TOTAL, FREET4, T3FREE, THYROIDAB in the last 72 hours. Anemia Panel: No results for input(s): VITAMINB12, FOLATE, FERRITIN, TIBC, IRON, RETICCTPCT in the last 72 hours.  Urine analysis:    Component Value Date/Time   COLORURINE YELLOW 12/03/2018 1202   APPEARANCEUR HAZY (A) 12/03/2018 1202   LABSPEC 1.011 12/03/2018 1202   PHURINE 5.0 12/03/2018 1202   GLUCOSEU NEGATIVE 12/03/2018 1202   HGBUR MODERATE (A) 12/03/2018 1202   BILIRUBINUR NEGATIVE 12/03/2018 1202   KETONESUR NEGATIVE 12/03/2018 1202   PROTEINUR NEGATIVE 12/03/2018 1202   NITRITE NEGATIVE 12/03/2018 1202   LEUKOCYTESUR NEGATIVE 12/03/2018 1202    Radiological Exams on Admission: Ct Head Wo Contrast  Result Date: 12/03/2018 CLINICAL DATA:  Altered level of consciousness. EXAM: CT HEAD WITHOUT CONTRAST TECHNIQUE: Contiguous axial images were obtained from the base of the skull through the vertex without intravenous contrast. COMPARISON:  None. FINDINGS: Brain: No evidence of acute infarction, hemorrhage, hydrocephalus, extra-axial collection or mass lesion/mass effect. Mild brain parenchymal volume loss and deep white matter microangiopathy. Vascular: Calcific atherosclerotic disease of the intra cavernous carotid arteries, mild. Skull: Normal. Negative for fracture or focal lesion. Sinuses/Orbits: No acute finding. Other: None. IMPRESSION: No acute intracranial abnormality. Electronically Signed   By: Fidela Salisbury M.D.   On: 12/03/2018 14:11   Dg Chest Portable 1 View  Result Date: 12/03/2018 CLINICAL DATA:  Acute change in mental status. EXAM: PORTABLE CHEST 1 VIEW COMPARISON:  October 25, 2018 FINDINGS: Cardiomediastinal silhouette is normal. Mediastinal contours appear intact. Calcific atherosclerotic disease of the aorta. There is no evidence of focal airspace consolidation, pleural effusion or pneumothorax. Low lung volumes. Osseous structures are without acute abnormality. Soft tissues are grossly normal. IMPRESSION: 1. No active disease. 2. Low lung volumes. Electronically Signed   By: Fidela Salisbury M.D.   On: 12/03/2018 12:11    EKG: Independently reviewed.  99 bpm with  normal sinus rhythm.  Telemetry with sinus tachycardia.  Assessment/Plan Active Problems:   Acute hepatic encephalopathy    Acute hepatic encephalopathy in the setting of suspected NASH liver cirrhosis -Start lactulose treatments as ordered -Recheck ammonia level in a.m. -Consultation to GI to assist with management -Continue monitor closely with neurochecks  NASH liver cirrhosis with hyperbilirubinemia and ascites/anasarca -No abdominal pain, nausea or vomiting noted currently -Continue to monitor repeat labs -Appreciate GI evaluation -Hold diuretics for now -Check INR -Monitor daily weights -Patient noted to be at 212 pounds on 11/02/2018  Lactic acidosis -Appreciate 30 cc/kg bolus -  Question if related to sepsis and will follow blood cultures.  Continue empiric vancomycin and cefepime for now. -Continue on IV fluid overnight -Repeat labs in a.m. -No source of infection currently identified chest x-ray clear, UA with no signs of UTI  Questionable AKI versus developing CKD stage III-4 related to intravascular volume depletion and ischemic ATN -Creatinine currently noted to be 1.73 with prior noted to be 2.09 -Continue to monitor strict I's and O's and avoid nephrotoxic agents -Monitor repeat labs in a.m.  Sinus tachycardia in the setting of SVT/atrial tachycardia -Likely related to dehydration with lactic acidosis -Monitor closely and consider d-dimer to rule out PE given immobility -Recent TSH 5.4 and echo with normal EF  Anemia of chronic disease -Currently stable with no overt bleeding noted -Repeat CBC in a.m.  Type 2 diabetes with mild hyperglycemia -Recent hemoglobin A1c 4.7% with metformin discontinued -Monitor closely on carb modified diet  Left lower extremity stasis dermatitis -Continue daily wound care  Hypothyroidism -Continue levothyroxine with recent TSH 5.4  Dyslipidemia -Continue statin   DVT prophylaxis: Heparin Code Status: Full Family  Communication: Daughter, [name] at bedside Disposition Plan: Admit for treatment of acute hepatic encephalopathy Consults called: GI Admission status: Inpatient, stepdown unit   Phoua Hoadley Darleen Crocker DO [facility] Pager [phone]  If 7PM-7AM, please contact night-coverage [email or web address] Password [email or web address]  12/03/2018, 2:59 PM

## 2018-12-03 NOTE — Telephone Encounter (Signed)
I spoke to the pt's daughter, Jordan Le. She said she went to take her mom to an appointment this morning for blood work. When she got there, her mom was not dressed, and was very disoriented. Her husband lives there also, but he has Hospice care and also one of [name]'s cousin's lives there but she is handicapped. [name] lives across the street from her. She said this change was sudden and she just wanted to let us know. She had called PCP and they said go to the ED. I told her that is what she should do, she needs to be assessed right away. She said she will take her to [facility].  Forwarding FYI to Walden Field, NP who saw her in office last.

## 2018-12-03 NOTE — Telephone Encounter (Signed)
Pt's daughter, Angelica Pou, called with concerns about the patient being confused. She would like for the nurse to call her back. [phone]

## 2018-12-03 NOTE — ED Notes (Signed)
CRITICAL VALUE ALERT  Critical Value:  Lactic 4.1  Date & Time Notied: 2449 12/03/18 Provider Notified: long  Orders Received/Actions taken:

## 2018-12-03 NOTE — Telephone Encounter (Signed)
Noted, no further recommendations. If consulted, we can see her in the hospital.

## 2018-12-04 ENCOUNTER — Inpatient Hospital Stay (HOSPITAL_COMMUNITY): Payer: PPO

## 2018-12-04 DIAGNOSIS — R188 Other ascites: Secondary | ICD-10-CM

## 2018-12-04 DIAGNOSIS — K7682 Hepatic encephalopathy: Secondary | ICD-10-CM

## 2018-12-04 DIAGNOSIS — E872 Acidosis, unspecified: Secondary | ICD-10-CM

## 2018-12-04 DIAGNOSIS — E860 Dehydration: Secondary | ICD-10-CM

## 2018-12-04 DIAGNOSIS — K7290 Hepatic failure, unspecified without coma: Secondary | ICD-10-CM

## 2018-12-04 DIAGNOSIS — K7200 Acute and subacute hepatic failure without coma: Principal | ICD-10-CM

## 2018-12-04 DIAGNOSIS — K7460 Unspecified cirrhosis of liver: Secondary | ICD-10-CM

## 2018-12-04 LAB — CBC
HCT: 28 % — ABNORMAL LOW (ref 36.0–46.0)
Hemoglobin: 9.1 g/dL — ABNORMAL LOW (ref 12.0–15.0)
MCH: 31.7 pg (ref 26.0–34.0)
MCHC: 32.5 g/dL (ref 30.0–36.0)
MCV: 97.6 fL (ref 80.0–100.0)
Platelets: 143 10*3/uL — ABNORMAL LOW (ref 150–400)
RBC: 2.87 MIL/uL — ABNORMAL LOW (ref 3.87–5.11)
RDW: 16.4 % — ABNORMAL HIGH (ref 11.5–15.5)
WBC: 11 10*3/uL — ABNORMAL HIGH (ref 4.0–10.5)
nRBC: 0 % (ref 0.0–0.2)

## 2018-12-04 LAB — COMPREHENSIVE METABOLIC PANEL
ALT: 26 U/L (ref 0–44)
AST: 52 U/L — ABNORMAL HIGH (ref 15–41)
Albumin: 2.2 g/dL — ABNORMAL LOW (ref 3.5–5.0)
Alkaline Phosphatase: 61 U/L (ref 38–126)
Anion gap: 8 (ref 5–15)
BUN: 29 mg/dL — ABNORMAL HIGH (ref 8–23)
CO2: 24 mmol/L (ref 22–32)
Calcium: 8.3 mg/dL — ABNORMAL LOW (ref 8.9–10.3)
Chloride: 104 mmol/L (ref 98–111)
Creatinine, Ser: 1.28 mg/dL — ABNORMAL HIGH (ref 0.44–1.00)
GFR calc Af Amer: 48 mL/min — ABNORMAL LOW (ref >60)
GFR calc non Af Amer: 41 mL/min — ABNORMAL LOW (ref >60)
Glucose, Bld: 95 mg/dL (ref 70–99)
Potassium: 3.5 mmol/L (ref 3.5–5.1)
Sodium: 136 mmol/L (ref 135–145)
Total Bilirubin: 3.5 mg/dL — ABNORMAL HIGH (ref 0.3–1.2)
Total Protein: 5.8 g/dL — ABNORMAL LOW (ref 6.5–8.1)

## 2018-12-04 LAB — LACTIC ACID, PLASMA: Lactic Acid, Venous: 1.3 mmol/L (ref 0.5–1.9)

## 2018-12-04 LAB — MAGNESIUM: Magnesium: 2.1 mg/dL (ref 1.7–2.4)

## 2018-12-04 LAB — PROTIME-INR
INR: 2.2 — ABNORMAL HIGH (ref 0.8–1.2)
Prothrombin Time: 23.8 seconds — ABNORMAL HIGH (ref 11.4–15.2)

## 2018-12-04 LAB — AMMONIA: Ammonia: 64 umol/L — ABNORMAL HIGH (ref 9–35)

## 2018-12-04 LAB — MRSA PCR SCREENING: MRSA by PCR: NEGATIVE

## 2018-12-04 MED ORDER — RIFAXIMIN 550 MG PO TABS
550.0000 mg | ORAL_TABLET | Freq: Two times a day (BID) | ORAL | Status: DC
  Administered 2018-12-04 – 2018-12-08 (×8): 550 mg via ORAL
  Filled 2018-12-04 (×8): qty 1

## 2018-12-04 MED ORDER — GADOBUTROL 1 MMOL/ML IV SOLN
9.0000 mL | Freq: Once | INTRAVENOUS | Status: AC | PRN
  Administered 2018-12-04: 14:00:00 9 mL via INTRAVENOUS

## 2018-12-04 NOTE — Progress Notes (Signed)
PROGRESS NOTE    Jordan Le  XBJ:[id number] DOB: 16-Dec-1945 DOA: 12/03/2018 PCP: Manon Hilding, MD   Brief Narrative:  Per HPI: Jordan Le is a 73 y.o. 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  Her last known normal was approximately 830 yesterday evening.  She was at her doctor's appointment for routine lab work this morning and was told to come to the emergency department on account of her confusion.  She is also noted to be incontinent of stool and usually struggles with severe constipation and uses suppositories on a regular basis.  She sees Dr. Oneida Alar as her gastroenterologist who recently diagnosed her with liver cirrhosis and was also concerned about elevated bilirubin at the end of August.  9/11: Patient appears improved this morning with lower lactic acid levels.  Will discontinue vancomycin as MRSA nares is negative.  Blood cultures negative thus far.  Continue cefepime empirically for now.  GI consultation pending.  Ammonia levels have decreased and mentation has improved.  Continue gentle IV fluid as patient is not quite tolerating diet and avoid diuretics for now.  Assessment & Plan:   Active Problems:   Acute hepatic encephalopathy   Acute hepatic encephalopathy in the setting of suspected NASH liver cirrhosis-improving -Continue lactulose as ordered -Ammonia levels downtrending with improvement in mentation noted -Consultation to GI to assist with management -Continue monitor closely with neurochecks  NASH liver cirrhosis with hyperbilirubinemia and ascites/anasarca -No abdominal pain, but some nausea and  vomiting noted last night -LFTs and bilirubin levels downtrending -Continue to monitor repeat labs -Appreciate GI evaluation -Hold diuretics for now -INR noted to be 2.2 -Monitor daily weights noted to be 188 pounds -Patient noted to be at 212 pounds on 11/02/2018  Lactic acidosis-resolved -Continue gentle fluid and repeat in a.m. -Appears to be related to dehydration -Continue cefepime empirically for now and discontinue vancomycin -Follow blood cultures  Questionable AKI versus developing CKD stage III-4 related to intravascular volume depletion and ischemic ATN-improved -Creatinine currently noted to be 1.28 with prior noted to be 1.73 -Continue to monitor strict I's and O's and avoid nephrotoxic agents -Currently positive fluid balance of 718 with urine output of 650 -Weight stable at 188 pounds -Monitor repeat labs in a.m.  Sinus tachycardia in the setting of SVT/atrial tachycardia- improving -Likely related to dehydration with lactic acidosis -Avoid d-dimer at the moment as patient is not hypoxemic -Recent TSH 5.4 and echo with normal EF  Anemia of chronic disease-downtrending -No overt bleeding identified, likely secondary to hemoconcentration -Repeat CBC in a.m.  Type 2 diabetes with mild hyperglycemia -Recent hemoglobin A1c 4.7% with metformin discontinued -Monitor closely on carb modified diet  Left lower extremity stasis dermatitis -Appears resolved at the moment  Hypothyroidism -Continue levothyroxine with recent TSH 5.4  Dyslipidemia -Continue statin   DVT prophylaxis: Heparin to SCDs Code Status: Full Family Communication: Discussed with daughter in ED Disposition Plan: GI evaluation today.  Continue gentle IV fluid and close monitoring.  Continue on lactulose and monitor ammonia levels.   Consultants:   GI  Procedures:   None  Antimicrobials:  Anti-infectives (From admission, onward)   Start     Dose/Rate Route Frequency Ordered Stop  12/04/18 1400  vancomycin (VANCOCIN) IVPB 1000 mg/200 mL premix  Status:  Discontinued     1,000 mg 200 mL/hr over 60 Minutes Intravenous Every 24 hours 12/03/18 1224 12/04/18 0646   12/04/18 1300  ceFEPIme (MAXIPIME) 1 g in sodium chloride 0.9 % 100 mL IVPB     1 g 200 mL/hr over 30 Minutes Intravenous Every 24 hours 12/03/18 1220     12/03/18 1400  vancomycin (VANCOCIN) IVPB 1000 mg/200 mL premix     1,000 mg 200 mL/hr over 60 Minutes Intravenous  Once 12/03/18 1222     12/03/18 1300  vancomycin (VANCOCIN) IVPB 1000 mg/200 mL premix     1,000 mg 200 mL/hr over 60 Minutes Intravenous  Once 12/03/18 1222 12/03/18 1542   12/03/18 1215  ceFEPIme (MAXIPIME) 2 g in sodium chloride 0.9 % 100 mL IVPB     2 g 200 mL/hr over 30 Minutes Intravenous  Once 12/03/18 1214 12/03/18 1342   12/03/18 1215  metroNIDAZOLE (FLAGYL) IVPB 500 mg     500 mg 100 mL/hr over 60 Minutes Intravenous  Once 12/03/18 1214 12/03/18 1542   12/03/18 1215  vancomycin (VANCOCIN) IVPB 1000 mg/200 mL premix  Status:  Discontinued     1,000 mg 200 mL/hr over 60 Minutes Intravenous  Once 12/03/18 1214 12/03/18 1222       Subjective: Patient seen and evaluated today with no new acute complaints or concerns. No acute concerns or events noted overnight.  Her heart rate seems improved as well as her lab work.  She is much less confused and more alert.  She did not tolerate her diet last night and had some nausea with vomiting.  Objective: Vitals:   12/04/18 0300 12/04/18 0400 12/04/18 0500 12/04/18 0600  BP: (!) 103/50 (!) 104/47 (!) 110/44 (!) 92/48  Pulse: 85 (!) 118 (!) 117 (!) 114  Resp: (!) 22 (!) 24 (!) 22 18  Temp:  98.9 F (37.2 C)    TempSrc:  Oral    SpO2: 96% 97% 96% 93%  Weight:   85.7 kg   Height:        Intake/Output Summary (Last 24 hours) at 12/04/2018 0655 Last data filed at 12/04/2018 0500 Gross per 24 hour  Intake 1368.07 ml  Output 650 ml  Net 718.07 ml   Filed Weights   12/03/18 1625  12/04/18 0500  Weight: 85.7 kg 85.7 kg    Examination:  General exam: Appears calm and comfortable  Respiratory system: Clear to auscultation. Respiratory effort normal.  Currently on room air. Cardiovascular system: S1 & S2 heard, RRR. No JVD, murmurs, rubs, gallops or clicks. No pedal edema. Gastrointestinal system: Abdomen is mildly distended, soft and nontender. No organomegaly or masses felt. Normal bowel sounds heard. Central nervous system: Alert and oriented. No focal neurological deficits. Extremities: Symmetric 5 x 5 power. Skin: No rashes, lesions or ulcers Psychiatry: Judgement and insight appear normal. Mood & affect appropriate.     Data Reviewed: I have personally reviewed following labs and imaging studies  CBC: Recent Labs  Lab 12/03/18 1054 12/04/18 0511  WBC 14.3* 11.0*  HGB 11.1* 9.1*  HCT 34.2* 28.0*  MCV 96.6 97.6  PLT 201 169*   Basic Metabolic Panel: Recent Labs  Lab 12/03/18 1054 12/04/18 0511  NA 131* 136  K 4.0 3.5  CL 94* 104  CO2 25 24  GLUCOSE 160* 95  BUN 35* 29*  CREATININE 1.73* 1.28*  CALCIUM 8.8* 8.3*  MG  --  2.1   GFR: Estimated Creatinine Clearance: 40 mL/min (A) (by C-G formula based on SCr of 1.28 mg/dL (H)). Liver Function Tests: Recent Labs  Lab 12/03/18 1054 12/04/18 0511  AST 77* 52*  ALT 37 26  ALKPHOS 84 61  BILITOT 4.6* 3.5*  PROT 7.3 5.8*  ALBUMIN 3.0* 2.2*   Recent Labs  Lab 12/03/18 1054  LIPASE 39   Recent Labs  Lab 12/03/18 1055 12/04/18 0511  AMMONIA 95* 64*   Coagulation Profile: Recent Labs  Lab 12/04/18 0511  INR 2.2*   Cardiac Enzymes: No results for input(s): CKTOTAL, CKMB, CKMBINDEX, TROPONINI in the last 168 hours. BNP (last 3 results) No results for input(s): PROBNP in the last 8760 hours. HbA1C: No results for input(s): HGBA1C in the last 72 hours. CBG: Recent Labs  Lab 12/03/18 1108  GLUCAP 157*   Lipid Profile: No results for input(s): CHOL, HDL, LDLCALC, TRIG,  CHOLHDL, LDLDIRECT in the last 72 hours. Thyroid Function Tests: No results for input(s): TSH, T4TOTAL, FREET4, T3FREE, THYROIDAB in the last 72 hours. Anemia Panel: No results for input(s): VITAMINB12, FOLATE, FERRITIN, TIBC, IRON, RETICCTPCT in the last 72 hours. Sepsis Labs: Recent Labs  Lab 12/03/18 1054 12/03/18 1249 12/04/18 0511  LATICACIDVEN 4.1* 2.8* 1.3    Recent Results (from the past 240 hour(s))  Culture, blood (routine x 2)     Status: None (Preliminary result)   Collection Time: 12/03/18 10:54 AM   Specimen: BLOOD LEFT ARM  Result Value Ref Range Status   Specimen Description BLOOD LEFT ARM DRAWN BY RN  Final   Special Requests   Final    BOTTLES DRAWN AEROBIC AND ANAEROBIC Blood Culture results may not be optimal due to an excessive volume of blood received in culture bottles   Culture   Final    NO GROWTH < 24 HOURS Performed at [facility], [address] [place], [place] [address]    Report Status PENDING  Incomplete  SARS Coronavirus 2 [facility] order, Performed in [place] hospital lab) Nasopharyngeal Nasopharyngeal Swab     Status: None   Collection Time: 12/03/18 11:32 AM   Specimen: Nasopharyngeal Swab  Result Value Ref Range Status   SARS Coronavirus 2 NEGATIVE NEGATIVE Final    Comment: (NOTE) If result is NEGATIVE SARS-CoV-2 target nucleic acids are NOT DETECTED. The SARS-CoV-2 RNA is generally detectable in upper and lower  respiratory specimens during the acute phase of infection. The lowest  concentration of SARS-CoV-2 viral copies this assay can detect is 250  copies / mL. A negative result does not preclude SARS-CoV-2 infection  and should not be used as the sole basis for treatment or other  patient management decisions.  A negative result may occur with  improper specimen collection / handling, submission of specimen other  than nasopharyngeal swab, presence of viral mutation(s) within the  areas targeted by this assay, and inadequate  number of viral copies  (<250 copies / mL). A negative result must be combined with clinical  observations, patient history, and epidemiological information. If result is POSITIVE SARS-CoV-2 target nucleic acids are DETECTED. The SARS-CoV-2 RNA is generally detectable in upper and lower  respiratory specimens dur ing the acute phase of infection.  Positive  results are indicative of active infection with SARS-CoV-2.  Clinical  correlation with patient history and other diagnostic information is  necessary to determine patient infection status.  Positive results do  not rule out bacterial infection or co-infection with other viruses. If result  is PRESUMPTIVE POSTIVE SARS-CoV-2 nucleic acids MAY BE PRESENT.   A presumptive positive result was obtained on the submitted specimen  and confirmed on repeat testing.  While 2019 novel coronavirus  (SARS-CoV-2) nucleic acids may be present in the submitted sample  additional confirmatory testing may be necessary for epidemiological  and / or clinical management purposes  to differentiate between  SARS-CoV-2 and other Sarbecovirus currently known to infect humans.  If clinically indicated additional testing with an alternate test  methodology [phone]) is advised. The SARS-CoV-2 RNA is generally  detectable in upper and lower respiratory sp ecimens during the acute  phase of infection. The expected result is Negative. Fact Sheet for Patients:  StrictlyIdeas.no Fact Sheet for Healthcare Providers: BankingDealers.co.za This test is not yet approved or cleared by the [place] FDA and has been authorized for detection and/or diagnosis of SARS-CoV-2 by FDA under an Emergency Use Authorization (EUA).  This EUA will remain in effect (meaning this test can be used) for the duration of the COVID-19 declaration under Section 564(b)(1) of the Act, 21 U.S.C. section 360bbb-3(b)(1), unless the  authorization is terminated or revoked sooner. Performed at [facility], [address], [place], [place] [address]   Culture, blood (routine x 2)     Status: None (Preliminary result)   Collection Time: 12/03/18 12:49 PM   Specimen: BLOOD RIGHT HAND  Result Value Ref Range Status   Specimen Description BLOOD RIGHT HAND  Final   Special Requests   Final    BOTTLES DRAWN AEROBIC AND ANAEROBIC Blood Culture adequate volume   Culture   Final    NO GROWTH < 24 HOURS Performed at [facility], [address], [place], [place] [address]    Report Status PENDING  Incomplete  MRSA PCR Screening     Status: None   Collection Time: 12/03/18  4:25 PM   Specimen: Nasopharyngeal  Result Value Ref Range Status   MRSA by PCR NEGATIVE NEGATIVE Final    Comment:        The GeneXpert MRSA Assay (FDA approved for NASAL specimens only), is one component of a comprehensive MRSA colonization surveillance program. It is not intended to diagnose MRSA infection nor to guide or monitor treatment for MRSA infections. Performed at [facility], [address], [place], [place] [address]          Radiology Studies: Ct Head Wo Contrast  Result Date: 12/03/2018 CLINICAL DATA:  Altered level of consciousness. EXAM: CT HEAD WITHOUT CONTRAST TECHNIQUE: Contiguous axial images were obtained from the base of the skull through the vertex without intravenous contrast. COMPARISON:  None. FINDINGS: Brain: No evidence of acute infarction, hemorrhage, hydrocephalus, extra-axial collection or mass lesion/mass effect. Mild brain parenchymal volume loss and deep white matter microangiopathy. Vascular: Calcific atherosclerotic disease of the intra cavernous carotid arteries, mild. Skull: Normal. Negative for fracture or focal lesion. Sinuses/Orbits: No acute finding. Other: None. IMPRESSION: No acute intracranial abnormality. Electronically Signed   By: Fidela Salisbury M.D.   On: 12/03/2018 14:11   Dg Chest Portable  1 View  Result Date: 12/03/2018 CLINICAL DATA:  Acute change in mental status. EXAM: PORTABLE CHEST 1 VIEW COMPARISON:  October 25, 2018 FINDINGS: Cardiomediastinal silhouette is normal. Mediastinal contours appear intact. Calcific atherosclerotic disease of the aorta. There is no evidence of focal airspace consolidation, pleural effusion or pneumothorax. Low lung volumes. Osseous structures are without acute abnormality. Soft tissues are grossly normal. IMPRESSION: 1. No active disease. 2. Low lung volumes. Electronically Signed  By: Fidela Salisbury M.D.   On: 12/03/2018 12:11        Scheduled Meds:  aspirin EC  81 mg Oral QPM   atorvastatin  10 mg Oral q1800   Chlorhexidine Gluconate Cloth  6 each Topical Daily   diltiazem  120 mg Oral Daily   feeding supplement (ENSURE ENLIVE)  237 mL Oral Q1400   heparin  5,000 Units Subcutaneous Q8H   influenza vaccine adjuvanted  0.5 mL Intramuscular Tomorrow-1000   lactulose  10 g Oral TID   levothyroxine  75 mcg Oral QAC breakfast   multivitamin with minerals  1 tablet Oral Daily   nystatin   Topical TID   pantoprazole  40 mg Oral Daily   sodium chloride flush  3 mL Intravenous Once   Continuous Infusions:  ceFEPime (MAXIPIME) IV     vancomycin       LOS: 1 day    Time spent: 30 minutes    Jordan Hornig Darleen Crocker, DO [facility] Pager [phone]  If 7PM-7AM, please contact night-coverage [email or web address] Password [facility] 12/04/2018, 6:55 AM

## 2018-12-04 NOTE — Care Management Important Message (Signed)
Important Message  Patient Details  Name: Jordan Le MRN: [id number] Date of Birth: [birth year]   Medicare Important Message Given:  Yes     [name] 12/04/2018, 1:17 PM

## 2018-12-04 NOTE — TOC Initial Note (Signed)
Transition of Care [facility]) - Initial/Assessment Note    Patient Details  Name: Jordan Le MRN: [id number] Date of Birth: 1946-03-04  [facility] CM/SW Contact:    Jordan Lucks, RN Phone Number: 12/04/2018, 3:27 PM  Clinical Narrative:       Patient admitted with Acute Hepatic encephalopathy. Patient is high risk for readmission. Spoke with Daughter- Jordan Le. 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  Her husband is on hospice care, they have DME's needed in the home. Jordan Le is concern for long term planning. Requesting Home Health. Choices Given.  Spoke with Jordan Le at [facility], she took the referral  For Aide/SW/PT.  Patient expected to discharge on Sunday.             Expected Discharge Plan: [place] Barriers to Discharge: Continued Medical Work up   Patient Goals and CMS Choice Patient states their goals for this hospitalization and ongoing recovery are:: to go home. CMS Medicare.gov Compare Post Acute Care list provided to:: Patient Represenative (must comment) Choice offered to / list presented to : Adult Children  Expected Discharge Plan and Services Expected Discharge Plan: [place] Choice: [place] arrangements for the past 2 months: [place]: Social Work, Nurse's Aide, PT [place] Date [place]: 12/04/18 Time [place]: 61 Representative spoke with at [place]: Jordan Le  Prior Living Arrangements/Services Living arrangements for the past 2 months: [place] with:: Spouse Patient language and need for interpreter reviewed:: Yes Do you feel safe going back to the place where you live?: Yes      Need for Family Participation in Patient Care: Yes (Comment) Care giver support system in place?: Yes (comment) Current home  services: DME Criminal Activity/Legal Involvement Pertinent to Current Situation/Hospitalization: No - Comment as needed  Activities of Daily Living Home Assistive Devices/Equipment: Eyeglasses ADL Screening (condition at time of admission) Patient's cognitive ability adequate to safely complete daily activities?: Yes Is the patient deaf or have difficulty hearing?: No Does the patient have difficulty seeing, even when wearing glasses/contacts?: No Does the patient have difficulty concentrating, remembering, or making decisions?: No Patient able to express need for assistance with ADLs?: Yes Does the patient have difficulty dressing or bathing?: No Independently performs ADLs?: Yes (appropriate for developmental age) Does the patient have difficulty walking or climbing stairs?: Yes Weakness of Legs: Both Weakness of Arms/Hands: None  Permission Sought/Granted            Permission granted to share info w Relationship: Jordan Le - daugher     Emotional Assessment         Alcohol / Substance Use: Not Applicable Psych Involvement: No (comment)  Admission diagnosis:  Hepatic encephalopathy ([place]) [K72.90] Patient Active Problem List   Diagnosis Date Noted  . Hepatic encephalopathy ([place])   . Lactic acidosis   . Dehydration   . Acute hepatic encephalopathy 12/03/2018  . Hepatitis B vaccination not up to date 11/16/2018  . SVT (supraventricular tachycardia) ([place]) 10/27/2018  . AKI (acute kidney injury) ([place])   . Anasarca   . Pleural effusion on left   . Acute renal failure (ARF) ([place]) 10/25/2018  . Essential hypertension  10/25/2018  . SOB (shortness of breath) 10/25/2018  . Acute lower UTI 10/25/2018  . Diabetes mellitus without complication ([place])   . Cirrhosis of liver with ascites ([place])   . High cholesterol   . Mini stroke ([place])   . Endometrial cancer ([place]) 05/07/2018  . Urinary tract infection 05/07/2018   PCP:  Jordan Hilding, MD Pharmacy:   [facility],  [place] [address] [place] [address] Phone: [phone] Fax: [phone]        Readmission Risk Interventions Readmission Risk Prevention Plan 12/04/2018  Transportation Screening Complete  HRI or [place] Complete  Social Work Consult for [place] Planning/Counseling Complete  Palliative Care Screening Not Complete  Medication Review Press photographer) Complete  Some recent data might be hidden

## 2018-12-04 NOTE — Progress Notes (Signed)
Nursing Home Choices:  [facility] [phone]  [place] my Favorites Quality of Patient Care Rating 3 out of 5 stars Patient Survey Summary Rating 4 out of [place] [phone]  [place] my Favorites Quality of Patient Care Rating 3  out of 5 stars Patient Survey Summary Rating 4 out of [place] [phone]  [place] my Favorites Quality of Patient Care Rating 4 out of 5 stars Patient Survey Summary Rating 4 out of [place] [phone]  [place] my Favorites Quality of Patient Care Rating 3 out of 5 stars Patient Survey Summary Rating 5 out of [place] [phone]) [phone]  Add [facility] my Favorites Quality of Patient Care Rating 4  out of 5 stars Patient Survey Summary Rating 3 out of 5 stars [place] [phone]  Add [facility] my Favorites Quality of Patient Care Rating 4 out of 5 stars Patient Survey Summary Rating 4 out of 5 stars [place] [phone]  [place] my Favorites Quality of Patient Care Rating 4 out of 5 stars Patient Survey Summary Rating 4 out of 5 stars [place] [phone]  [place] my Favorites Quality of Patient Care Rating 4 out of 5 stars Patient Survey Summary Rating 4 out of 5 stars [place] AGE [phone]  [place] my Favorites Quality of Patient Care Rating 3 out of 5 stars Patient Survey Summary Rating 3 out of 5 stars [facility] [place] [phone]  Add [facility] [place] my Favorites Quality of Patient Care Rating 3  out of 5 stars Patient Survey Summary Rating 4 out of 5 stars [place] [phone]  [place] my Favorites Quality of Patient Care Rating 3 out of 5 stars Patient Survey Summary Rating 4 out of 5 stars [facility] ([phone]) [phone]  Add [facility] my Favorites Quality of Patient Care Rating 3  out of 5 stars Patient Survey Summary Rating 3 out of 5 stars [place] [phone]  Add [facility] my Favorites Quality of Patient Care Rating 3  out of 5 stars Not [place] [phone]  [place] my Favorites Quality of Patient Care Rating 4  out of 5 stars Patient Survey Summary Rating 3 out of 5 stars

## 2018-12-04 NOTE — Consult Note (Signed)
Referring Provider: [facility] Primary Care Physician:  Manon Hilding, MD Primary Gastroenterologist:  Dr. Oneida Alar  Date of Admission: 12/03/2018 Date of Consultation: 12/04/2018  Reason for Consultation:  Hepatic Encephalopathy  HPI:  Jordan Le is a 73 y.o. female with a past medical history of endometrial cancer, type 2 diabetes, hypercholesterolemia, hypertension, TIA in 2011, renal disorder.  She has recently been diagnosed with cirrhosis.  She was referred to our office initially at the end of August by primary care.  Abdominal ultrasound dated 10/13/2018 found gallbladder sludge, moderate ascites, small nodular liver compatible with cirrhosis.  Etiology likely due to [place].  Recent admission 10/25/2018 through 11/05/2018 for anasarca and acute renal failure she was started on furosemide in April which was increased to 40 mg in May with Spironolactone added 3 weeks prior to admission.  Her creatinine on admission was 3.94 and platelets 213,000.  CT of the abdomen again showed a shrunken nodular liver consistent with cirrhosis and significant ascites.  She underwent paracentesis 10/26/2018 with 2 L removed.  Viral hepatitis serologies negative, autoimmune antibodies negative, alpha-1 antitrypsin level normal.  Albumin status post paracentesis due to need to hold diuretics as a result of acute kidney injury.  She was given Rocephin for SBP prophylaxis at that time as well.  Overall recommended need for EGD for variceal screening and outpatient follow-up including hepatitis a and B vaccination, 2 g sodium diet.  On 10/31/2018 she had normal AST/ALT, normal alkaline phosphatase, bilirubin 1.9 which was an improvement from 2.3 a couple days prior.  INR elevated 2.0.  Meld score was calculated at 26 with child Pugh class B-C.  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  Rare chance of affected individual and ferritin was elevated at  584 although this was felt to be an acute phase reactant.  At her last visit she noted she was told after her hysterectomy that she had fatty liver but she did not follow-up.  She thinks her grandfather died of liver problems although he does not drink.  She does not drink alcohol at all.  Her swelling had improved due to wrapping her ankles although still some residual lower extremity edema.  Admits compliance with low-salt diet.  Had never been vaccinated for hepatitis A or B.  At that time we recommended that she obtain vaccination, schedule ultrasound, schedule upper endoscopy.  Her EGD was completed 11/23/2018 which showed mild portal hypertensive gastropathy and moderate gastritis/mild duodenitis due to aspirin.  Due for repeat screening in 2 to 3 years.  It was recommended she be referred to the liver center for transplant evaluation.  She was scheduled for another paracentesis that occurred on 11/25/2018.  Yesterday the patient's family called stating the patient was confused and disoriented as well as incontinent of stool and unable to answer questions appropriately.  They were concerned and advised to bring the patient to the emergency department.  There she was found to have no major neurological deficit but only oriented to person and time.  Some abdominal distention but no definite ascites.  Ammonia was elevated but no previous for comparison and likely suffering from hepatic encephalopathy.  A lactulose enema was ordered.  Initial lactic acid elevated and she was initiated on antibiotics and rehydration.  The patient is somewhat more responsive after fluids and lactulose.  She was subsequently admitted for further care.  Labs today show stable creatinine 1.28, mildly elevated AST at 52, normal ALT,  normal alkaline phosphatase, elevated but improving bilirubin at 3.5 (4.6 yesterday).  Mild anemia although her hemoglobin of 9.1 this morning appears within her baseline.  Platelet count mildly  suppressed now at 143.  She initially had a white count of 14.3 but this declined to 11.0.  Query hydration effect.  INR elevated at 2.2.  Ammonia improved to 64.  Lactic acid improved from 4.1 in the ED with a decline to 2.8 yesterday and 1.3 this morning.  Of note she recently had a significantly elevated AFP at 14.4 and recommended MRI which is scheduled for a few days from today.  Today she appears much improved. She is able to recognize me and remembers my name. She is eating oatmeal this morning. States she's keeping down her breakfast ok. States she doesn't remember anything about yesterday morning. Is now oriented x 4. Denies abdominal pain, N/V, hematochezia, melena. States her abdomen feels a little bit "swollen." Denies any other GI complaints.  Past Medical History:  Diagnosis Date  . Arthritis   . Cancer [facility])    endometrial cancer  . Cirrhosis of liver ([place])   . Diabetes mellitus without complication ([place])    type 2  . High cholesterol   . Hypertension   . Mini stroke ([place])    2011  . Pneumonia   . Renal disorder     Past Surgical History:  Procedure Laterality Date  . ABDOMINAL HYSTERECTOMY    . BIOPSY  11/23/2018   Procedure: BIOPSY;  Surgeon: Danie Binder, MD;  Location: [facility];  Service: Endoscopy;;  . BREAST BIOPSY Right    ~15 years ago-2005  . ESOPHAGOGASTRODUODENOSCOPY N/A 11/23/2018   Procedure: ESOPHAGOGASTRODUODENOSCOPY (EGD);  Surgeon: Danie Binder, MD;  Location: [facility];  Service: Endoscopy;  Laterality: N/A;  1:00pm  . OTHER SURGICAL HISTORY  2009   Uterine Polyp removal  . polyps removed from the uterus    . ROBOTIC ASSISTED TOTAL HYSTERECTOMY WITH BILATERAL SALPINGO OOPHERECTOMY Bilateral 05/07/2018   Procedure: XI ROBOTIC ASSISTED TOTAL HYSTERECTOMY WITH BILATERAL SALPINGO OOPHORECTOMY PELVIC LYMPHANECTOMY;  Surgeon: Everitt Amber, MD;  Location: [name];  Service: Gynecology;  Laterality: Bilateral;  . SENTINEL NODE BIOPSY Bilateral  05/07/2018   Procedure: SENTINEL NODE BIOPSY;  Surgeon: Everitt Amber, MD;  Location: [name];  Service: Gynecology;  Laterality: Bilateral;    Prior to Admission medications   Medication Sig Start Date End Date Taking? Authorizing Provider  atorvastatin (LIPITOR) 10 MG tablet Take 1 tablet (10 mg total) by mouth daily at 6 PM. 11/05/18  Yes Emokpae, Courage, MD  diltiazem (CARDIZEM CD) 120 MG 24 hr capsule Take 1 capsule (120 mg total) by mouth daily. 11/05/18 11/05/19 Yes Emokpae, Courage, MD  feeding supplement, ENSURE ENLIVE, (ENSURE ENLIVE) LIQD Take 237 mLs by mouth 2 (two) times daily between meals. Patient taking differently: Take 237 mLs by mouth daily at 2 PM.  11/05/18  Yes Emokpae, Courage, MD  glycerin adult 2 g suppository Place 1 suppository rectally as needed for constipation.   Yes [provider]  levothyroxine (SYNTHROID) 75 MCG tablet Take 75 mcg by mouth daily before breakfast.  10/05/18 10/30/19 Yes [provider]  Multiple Vitamin (MULTIVITAMIN WITH MINERALS) TABS tablet Take 1 tablet by mouth daily. 11/06/18  Yes Emokpae, Courage, MD  nystatin (MYCOSTATIN/NYSTOP) powder Apply topically 3 (three) times daily. Patient taking differently: Apply 1 g topically daily at 2 PM.  11/05/18  Yes Emokpae, Courage, MD  omeprazole (PRILOSEC) 20 MG  capsule 1 PO 30 MINS PRIOR TO BREAKFAST. Patient taking differently: Take 20 mg by mouth daily before breakfast. 1 PO 30 MINS PRIOR TO BREAKFAST. 11/23/18  Yes Fields, Sandi L, MD  Potassium 99 MG TABS Take 1 tablet by mouth daily.   Yes [provider]  spironolactone (ALDACTONE) 25 MG tablet Take 1 tablet (25 mg total) by mouth daily. 11/05/18 06/03/19 Yes Emokpae, Courage, MD  torsemide (DEMADEX) 20 MG tablet Take 3 tablets (60 mg total) by mouth 2 (two) times daily. Patient taking differently: Take 40 mg by mouth 2 (two) times daily.  11/05/18  Yes Roxan Hockey, MD    Current Facility-Administered Medications  Medication  Dose Route Frequency Provider Last Rate Last Dose  . aspirin EC tablet 81 mg  81 mg Oral QPM Shah, Pratik D, DO   81 mg at 12/03/18 1744  . atorvastatin (LIPITOR) tablet 10 mg  10 mg Oral q1800 Manuella Ghazi, Pratik D, DO   10 mg at 12/03/18 1711  . ceFEPIme (MAXIPIME) 1 g in sodium chloride 0.9 % 100 mL IVPB  1 g Intravenous Q24H Manuella Ghazi, Pratik D, DO      . Chlorhexidine Gluconate Cloth 2 % PADS 6 each  6 each Topical Daily Heath Lark D, DO   6 each at 12/03/18 1625  . diltiazem (CARDIZEM CD) 24 hr capsule 120 mg  120 mg Oral Daily Manuella Ghazi, Pratik D, DO   120 mg at 12/03/18 1605  . feeding supplement (ENSURE ENLIVE) (ENSURE ENLIVE) liquid 237 mL  237 mL Oral Q1400 Manuella Ghazi, Pratik D, DO      . HYDROcodone-acetaminophen (NORCO/VICODIN) 5-325 MG per tablet 1-2 tablet  1-2 tablet Oral Q6H PRN Schorr, Rhetta Mura, NP   2 tablet at 12/04/18 0252  . influenza vaccine adjuvanted (FLUAD) injection 0.5 mL  0.5 mL Intramuscular Tomorrow-1000 Manuella Ghazi, Pratik D, DO      . lactulose (CHRONULAC) 10 GM/15ML solution 10 g  10 g Oral TID Manuella Ghazi, Pratik D, DO   10 g at 12/03/18 2120  . levothyroxine (SYNTHROID) tablet 75 mcg  75 mcg Oral QAC breakfast Heath Lark D, DO   75 mcg at 12/04/18 0533  . multivitamin with minerals tablet 1 tablet  1 tablet Oral Daily Manuella Ghazi, Pratik D, DO      . nystatin (MYCOSTATIN/NYSTOP) topical powder   Topical TID Manuella Ghazi, Pratik D, DO      . ondansetron (ZOFRAN) tablet 4 mg  4 mg Oral Q6H PRN Manuella Ghazi, Pratik D, DO       Or  . ondansetron (ZOFRAN) injection 4 mg  4 mg Intravenous Q6H PRN Manuella Ghazi, Pratik D, DO      . pantoprazole (PROTONIX) EC tablet 40 mg  40 mg Oral Daily Shah, Pratik D, DO      . sodium chloride flush (NS) 0.9 % injection 3 mL  3 mL Intravenous Once Manuella Ghazi, Pratik D, DO      . vancomycin (VANCOCIN) IVPB 1000 mg/200 mL premix  1,000 mg Intravenous Once Manuella Ghazi, Pratik D, DO        Allergies as of 12/03/2018  . (No Known Allergies)    Family History  Problem Relation Age of Onset  . Lung cancer  Mother   . Hypertension Father   . Pancreatic cancer Brother   . Liver disease Neg Hx     Social History   Socioeconomic History  . Marital status: Married    Spouse name: Not on file  . Number of children: Not on  file  . Years of education: Not on file  . Highest education level: Not on file  Occupational History  . Not on file  Social Needs  . Financial resource strain: Not on file  . Food insecurity    Worry: Not on file    Inability: Not on file  . Transportation needs    Medical: Not on file    Non-medical: Not on file  Tobacco Use  . Smoking status: Never Smoker  . Smokeless tobacco: Never Used  Substance and Sexual Activity  . Alcohol use: Never    Frequency: Never  . Drug use: Never  . Sexual activity: Not Currently  Lifestyle  . Physical activity    Days per week: Not on file    Minutes per session: Not on file  . Stress: Not on file  Relationships  . Social Herbalist on phone: Not on file    Gets together: Not on file    Attends religious service: Not on file    Active member of club or organization: Not on file    Attends meetings of clubs or organizations: Not on file    Relationship status: Not on file  . Intimate partner violence    Fear of current or ex partner: Not on file    Emotionally abused: Not on file    Physically abused: Not on file    Forced sexual activity: Not on file  Other Topics Concern  . Not on file  Social History Narrative  . Not on file    Review of Systems: General: Negative for anorexia, weight loss, fever, chills, fatigue, weakness. ENT: Negative for hoarseness, difficulty swallowing. CV: Negative for chest pain, angina, palpitations, peripheral edema.  Respiratory: Negative for dyspnea at rest, cough, sputum, wheezing.  GI: See history of present illness. MS: Negative for joint pain, low back pain.  Derm: Negative for rash or itching.  Endo: Negative for unusual weight change.  Heme: Negative for  bruising or bleeding. Allergy: Negative for rash or hives.  Physical Exam: Vital signs in last 24 hours: Temp:  [97.9 F (36.6 C)-99.1 F (37.3 C)] 98.9 F (37.2 C) (09/11 0400) Pulse Rate:  [84-134] 114 (09/11 0600) Resp:  [17-28] 18 (09/11 0600) BP: (91-132)/(42-94) 92/48 (09/11 0600) SpO2:  [93 %-100 %] 93 % (09/11 0600) Weight:  [85.7 kg] 85.7 kg (09/11 0500) Last BM Date: 12/03/18 General:   Alert,  Well-developed, well-nourished, pleasant and cooperative in NAD Head:  Normocephalic and atraumatic. Eyes:  Sclera clear, no icterus. Conjunctiva pink. Ears:  Normal auditory acuity. Neck:  Supple; no masses or thyromegaly. Lungs:  Clear throughout to auscultation. No wheezes, crackles, or rhonchi. No acute distress. Heart:  Regular rate and rhythm; no murmurs, clicks, rubs,  or gallops. Tachycardic rate apparent with auscultation and confirmed by telemetry monitor (HR 125) Abdomen:  Soft, nontender and nondistended. No masses, hepatosplenomegaly or hernias noted. Normal bowel sounds, without guarding, and without rebound.   Rectal:  Deferred.   Msk:  Symmetrical without gross deformities. Pulses:  Normal bilateral DP pulses noted. Extremities:  Without clubbing or edema. Neurologic:  Alert and  oriented x4;  grossly normal neurologically. Psych:  Alert and cooperative. Normal mood and affect.  Intake/Output from previous day: 09/10 0701 - 09/11 0700 In: 1368.1 [I.V.:0.1; IV Piggyback:1368] Out: 650 [Urine:650] Intake/Output this shift: No intake/output data recorded.  Lab Results: Recent Labs    12/03/18 1054 12/04/18 0511  WBC 14.3* 11.0*  HGB 11.1*  9.1*  HCT 34.2* 28.0*  PLT 201 143*   BMET Recent Labs    12/03/18 1054 12/04/18 0511  NA 131* 136  K 4.0 3.5  CL 94* 104  CO2 25 24  GLUCOSE 160* 95  BUN 35* 29*  CREATININE 1.73* 1.28*  CALCIUM 8.8* 8.3*   LFT Recent Labs    12/03/18 1054 12/04/18 0511  PROT 7.3 5.8*  ALBUMIN 3.0* 2.2*  AST 77* 52*   ALT 37 26  ALKPHOS 84 61  BILITOT 4.6* 3.5*   PT/INR Recent Labs    12/04/18 0511  LABPROT 23.8*  INR 2.2*   Hepatitis Panel No results for input(s): HEPBSAG, HCVAB, HEPAIGM, HEPBIGM in the last 72 hours. C-Diff No results for input(s): CDIFFTOX in the last 72 hours.  Studies/Results: Ct Head Wo Contrast  Result Date: 12/03/2018 CLINICAL DATA:  Altered level of consciousness. EXAM: CT HEAD WITHOUT CONTRAST TECHNIQUE: Contiguous axial images were obtained from the base of the skull through the vertex without intravenous contrast. COMPARISON:  None. FINDINGS: Brain: No evidence of acute infarction, hemorrhage, hydrocephalus, extra-axial collection or mass lesion/mass effect. Mild brain parenchymal volume loss and deep white matter microangiopathy. Vascular: Calcific atherosclerotic disease of the intra cavernous carotid arteries, mild. Skull: Normal. Negative for fracture or focal lesion. Sinuses/Orbits: No acute finding. Other: None. IMPRESSION: No acute intracranial abnormality. Electronically Signed   By: Fidela Salisbury M.D.   On: 12/03/2018 14:11   Dg Chest Portable 1 View  Result Date: 12/03/2018 CLINICAL DATA:  Acute change in mental status. EXAM: PORTABLE CHEST 1 VIEW COMPARISON:  October 25, 2018 FINDINGS: Cardiomediastinal silhouette is normal. Mediastinal contours appear intact. Calcific atherosclerotic disease of the aorta. There is no evidence of focal airspace consolidation, pleural effusion or pneumothorax. Low lung volumes. Osseous structures are without acute abnormality. Soft tissues are grossly normal. IMPRESSION: 1. No active disease. 2. Low lung volumes. Electronically Signed   By: Fidela Salisbury M.D.   On: 12/03/2018 12:11    Impression: Very pleasant 73 year old female with recently newly diagnosed cirrhosis and decompensation is apparent by paracentesis x2.  She presented with acute confusion with an elevated lactic acid and dehydration.  She was oriented x2  yesterday in the emergency department.  She was admitted on antibiotics, fluids and given a lactulose enema in the emergency department and continues with p.o. lactulose while admitted.  Her mental status is improved dramatically.  We discussed hepatic encephalopathy as a complication of newly diagnosed cirrhosis.  She does have a consult in with the liver clinic in [place] and her appointment is approximately 12/13/2018.  Due to elevated AFP she has an MRI scheduled for 12/08/2018 but we will attempt to accomplish this while she is here.  She states that she feels she could do an MRI while here and would appreciate the effort to prevent another trip.  She feels that she could lay still and follow directions as required for MRI.  She is eating breakfast today, tolerating foods.  No overt GI complaints.  Feels her abdomen is a bit swollen but there is no sign of tense ascites necessitating paracentesis at this time.  No other gross hepatic or GI concerning signs or symptoms.  Plan: 1. Continue lactulose, titrate for 3-4 soft bowel movements a day 2. Monitor LFTs, next check tomorrow 3. Supportive care 4. We will attempt MRI abdomen for elevated AFP while she is admitted inpatient 5. Further recommendations to follow 6. Keep outpatient appointment with the liver clinic in [facility]  Thank you for allowing [place] to participate in the care of Virgina Evener, DNP, AGNP-C Adult & Gerontological Nurse Practitioner [facility]    LOS: 1 day     12/04/2018, 8:07 AM

## 2018-12-05 DIAGNOSIS — K2970 Gastritis, unspecified, without bleeding: Secondary | ICD-10-CM

## 2018-12-05 DIAGNOSIS — K7200 Acute and subacute hepatic failure without coma: Secondary | ICD-10-CM

## 2018-12-05 DIAGNOSIS — K2980 Duodenitis without bleeding: Secondary | ICD-10-CM

## 2018-12-05 DIAGNOSIS — R188 Other ascites: Secondary | ICD-10-CM

## 2018-12-05 DIAGNOSIS — K7460 Unspecified cirrhosis of liver: Secondary | ICD-10-CM

## 2018-12-05 DIAGNOSIS — K766 Portal hypertension: Secondary | ICD-10-CM

## 2018-12-05 DIAGNOSIS — K3189 Other diseases of stomach and duodenum: Secondary | ICD-10-CM

## 2018-12-05 LAB — COMPREHENSIVE METABOLIC PANEL WITH GFR
ALT: 27 U/L (ref 0–44)
AST: 54 U/L — ABNORMAL HIGH (ref 15–41)
Albumin: 2.2 g/dL — ABNORMAL LOW (ref 3.5–5.0)
Alkaline Phosphatase: 80 U/L (ref 38–126)
Anion gap: 7 (ref 5–15)
BUN: 29 mg/dL — ABNORMAL HIGH (ref 8–23)
CO2: 25 mmol/L (ref 22–32)
Calcium: 8.4 mg/dL — ABNORMAL LOW (ref 8.9–10.3)
Chloride: 104 mmol/L (ref 98–111)
Creatinine, Ser: 1.36 mg/dL — ABNORMAL HIGH (ref 0.44–1.00)
GFR calc Af Amer: 44 mL/min — ABNORMAL LOW
GFR calc non Af Amer: 38 mL/min — ABNORMAL LOW
Glucose, Bld: 117 mg/dL — ABNORMAL HIGH (ref 70–99)
Potassium: 4 mmol/L (ref 3.5–5.1)
Sodium: 136 mmol/L (ref 135–145)
Total Bilirubin: 2.9 mg/dL — ABNORMAL HIGH (ref 0.3–1.2)
Total Protein: 5.9 g/dL — ABNORMAL LOW (ref 6.5–8.1)

## 2018-12-05 LAB — CBC
HCT: 28.8 % — ABNORMAL LOW (ref 36.0–46.0)
Hemoglobin: 9.3 g/dL — ABNORMAL LOW (ref 12.0–15.0)
MCH: 32 pg (ref 26.0–34.0)
MCHC: 32.3 g/dL (ref 30.0–36.0)
MCV: 99 fL (ref 80.0–100.0)
Platelets: 138 10*3/uL — ABNORMAL LOW (ref 150–400)
RBC: 2.91 MIL/uL — ABNORMAL LOW (ref 3.87–5.11)
RDW: 16.2 % — ABNORMAL HIGH (ref 11.5–15.5)
WBC: 10 10*3/uL (ref 4.0–10.5)
nRBC: 0 % (ref 0.0–0.2)

## 2018-12-05 LAB — PROTIME-INR
INR: 2.1 — ABNORMAL HIGH (ref 0.8–1.2)
Prothrombin Time: 22.9 seconds — ABNORMAL HIGH (ref 11.4–15.2)

## 2018-12-05 LAB — AMMONIA: Ammonia: 87 umol/L — ABNORMAL HIGH (ref 9–35)

## 2018-12-05 LAB — LACTIC ACID, PLASMA: Lactic Acid, Venous: 1.3 mmol/L (ref 0.5–1.9)

## 2018-12-05 MED ORDER — VITAMIN K1 10 MG/ML IJ SOLN
5.0000 mg | Freq: Once | INTRAMUSCULAR | Status: AC
  Administered 2018-12-05: 13:00:00 5 mg via SUBCUTANEOUS
  Filled 2018-12-05: qty 1

## 2018-12-05 MED ORDER — ALBUMIN HUMAN 25 % IV SOLN
25.0000 g | Freq: Two times a day (BID) | INTRAVENOUS | Status: DC
  Administered 2018-12-05 – 2018-12-08 (×7): 25 g via INTRAVENOUS
  Filled 2018-12-05: qty 100
  Filled 2018-12-05: qty 50
  Filled 2018-12-05 (×3): qty 100
  Filled 2018-12-05: qty 50
  Filled 2018-12-05 (×2): qty 100

## 2018-12-05 MED ORDER — SODIUM CHLORIDE 0.9% FLUSH
3.0000 mL | Freq: Two times a day (BID) | INTRAVENOUS | Status: DC
  Administered 2018-12-05 – 2018-12-08 (×8): 3 mL via INTRAVENOUS

## 2018-12-05 NOTE — Plan of Care (Signed)
  Problem: Acute Rehab PT Goals(only PT should resolve) Goal: Pt Will Go Supine/Side To Sit Outcome: Progressing Flowsheets (Taken 12/05/2018 1229) Pt will go Supine/Side to Sit: with modified independence Goal: Patient Will Transfer Sit To/From Stand Outcome: Progressing Flowsheets (Taken 12/05/2018 1229) Patient will transfer sit to/from stand:  with modified independence  with supervision Goal: Pt Will Transfer Bed To Chair/Chair To Bed Outcome: Progressing Flowsheets (Taken 12/05/2018 1229) Pt will Transfer Bed to Chair/Chair to Bed:  with modified independence  with supervision Goal: Pt Will Ambulate Outcome: Progressing Flowsheets (Taken 12/05/2018 1229) Pt will Ambulate:  > 125 feet  with modified independence  with supervision   12:29 PM, 12/05/18 Lonell Grandchild, MPT Physical Therapist with [facility] [phone] office [phone] mobile phone

## 2018-12-05 NOTE — Progress Notes (Signed)
Subjective:  Patient feels better.  She says her appetite is fair.  She ate some before breakfast.  She denies nausea or vomiting.  She has not had a bowel movement this morning but she is passing flatus.  She does not feel her abdomen is more distended when she came in.  She denies shortness of breath.  Her daughter Colletta [place] who is at bedside says she has not been confused this morning.  Objective: Blood pressure (!) 103/50, pulse (!) 117, temperature 98.4 F (36.9 C), temperature source Oral, resp. rate (!) 28, height '5\' 3"'  (1.6 m), weight 86 kg, SpO2 98 %. Patient is alert and responds appropriately to questions.  She is oriented x3. She does not have asterixis. Sclera does not appear to be icteric. Cardiac exam with regular rhythm normal S1 and S2.  No murmur or gallop noted. Auscultation of lungs reveal vesicular breath sounds bilaterally. Abdomen is distended but not tense or tender. Trace edema involving her ankles and both feet.  Labs/studies Results:  CBC Latest Ref Rng & Units 12/05/2018 12/04/2018 12/03/2018  WBC 4.0 - 10.5 K/uL 10.0 11.0(H) 14.3(H)  Hemoglobin 12.0 - 15.0 g/dL 9.3(L) 9.1(L) 11.1(L)  Hematocrit 36.0 - 46.0 % 28.8(L) 28.0(L) 34.2(L)  Platelets 150 - 400 K/uL 138(L) 143(L) 201    CMP Latest Ref Rng & Units 12/05/2018 12/04/2018 12/03/2018  Glucose 70 - 99 mg/dL 117(H) 95 160(H)  BUN 8 - 23 mg/dL 29(H) 29(H) 35(H)  Creatinine 0.44 - 1.00 mg/dL 1.36(H) 1.28(H) 1.73(H)  Sodium 135 - 145 mmol/L 136 136 131(L)  Potassium 3.5 - 5.1 mmol/L 4.0 3.5 4.0  Chloride 98 - 111 mmol/L 104 104 94(L)  CO2 22 - 32 mmol/L '25 24 25  ' Calcium 8.9 - 10.3 mg/dL 8.4(L) 8.3(L) 8.8(L)  Total Protein 6.5 - 8.1 g/dL 5.9(L) 5.8(L) 7.3  Total Bilirubin 0.3 - 1.2 mg/dL 2.9(H) 3.5(H) 4.6(H)  Alkaline Phos 38 - 126 U/L 80 61 84  AST 15 - 41 U/L 54(H) 52(H) 77(H)  ALT 0 - 44 U/L 27 26 37    Hepatic Function Latest Ref Rng & Units 12/05/2018 12/04/2018 12/03/2018  Total Protein 6.5 - 8.1 g/dL  5.9(L) 5.8(L) 7.3  Albumin 3.5 - 5.0 g/dL 2.2(L) 2.2(L) 3.0(L)  AST 15 - 41 U/L 54(H) 52(H) 77(H)  ALT 0 - 44 U/L 27 26 37  Alk Phosphatase 38 - 126 U/L 80 61 84  Total Bilirubin 0.3 - 1.2 mg/dL 2.9(H) 3.5(H) 4.6(H)  Bilirubin, Direct 0.0 - 0.2 mg/dL - - -    Blood cultures remain negative on day 2. INR 2.1. Serum ammonia 87   Assessment:  #1.  Hepatic encephalopathy.  Clinically she is better.  However serum ammonia is up since yesterday but less than when she came in.  No obvious triggers.  She had lactic acidosis on admission and presumed to be due to infection patient is on cefepime and vancomycin..  She did not undergo abdominal tap on admission.  Patient's condition at home could be monitored at home with simple test such as Trail test or stroop test before she develops full-blown hepatic encephalopathy.  Hopefully combination of lactulose and Xifaxan will prevent future episodes.  #2.  Mildly elevated alpha-fetoprotein.  MRI negative for focal abnormalities.  #3.  Ascites.  Ascites is not tense.  She may need LVAP prior to discharge.  She is not ready for diuretic therapy given hypotension and renal function.  #4.  Cirrhosis.  She has advanced cirrhosis.  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 MELD score is 23 with significant mortality.  It remains to be seen if coagulopathy would correct with vitamin K.  #5.  Anemia secondary to chronic illness.  No evidence of GI bleed.  #6.  History of SVT.  Heart around 120.  Patient is on Cardizem.   Recommendations  Agree with albumin infusion. Vitamin K 5 mg subcu x1. Metabolic 7, INR and serum ammonia in a.m.

## 2018-12-05 NOTE — Progress Notes (Signed)
PROGRESS NOTE    Jordan Le  LNL:[id number] DOB: 1946-02-06 DOA: 12/03/2018 PCP: Jordan Hilding, MD   Brief Narrative:  Per HPI: Jordan Derstine Perkinsis a 73 y.o.femalewith medical history significant fortype 2 diabetes, dyslipidemia, prior TIAs, prior endometrial carcinoma with hysterectomy,hypertension, SVT/atrial tachycardia, hypothyroidism, GERD, anemia of chronic disease, and recently diagnosed NASHliver cirrhosis with esophageal varices, who presented to the ED after she was noted to be quite confused and disoriented by her daughter this morning. Her last known normal was approximately 830 yesterday evening. She was at her doctor's appointment for routine lab work this morning and was told to come to the emergency department on account of her confusion. She is also noted to be incontinent of stool and usually struggles with severe constipation and uses suppositories on a regular basis. She sees Dr. Oneida Le as her gastroenterologist who recently diagnosed her with liver cirrhosis and was also concerned about elevated bilirubin at the end of August.  9/11: Patient appears improved this morning with lower lactic acid levels.  Will discontinue vancomycin as MRSA nares is negative.  Blood cultures negative thus far.  Continue cefepime empirically for now.  GI consultation pending.  Ammonia levels have decreased and mentation has improved.  Continue gentle IV fluid as patient is not quite tolerating diet and avoid diuretics for now.  9/12: Patient appears clinically improved, but maintaining very soft blood pressure readings as well as some tachycardia.  INR elevated and GI to administer vitamin K.  We will also order albumin infusion after discussion with GI given soft blood pressure readings.  May need paracentesis prior to discharge.  We will continue cefepime empirically for now.  Ammonia levels still elevated and rifaximin started 9/11.  MRI abdomen with noted large volume paracentesis, but  no focal abnormalities.  Assessment & Plan:   Active Problems:   Acute hepatic encephalopathy   Hepatic encephalopathy (HCC)   Lactic acidosis   Dehydration   Acute hepatic encephalopathy in the setting ofsuspected NASH liver cirrhosis-improving -Continue lactulose and Xifaxan as ordered by GI -Continue to monitor ammonia levels -Appreciate GI evaluation and management -Continue monitor closely with neurochecks  NASHliver cirrhosis with hyperbilirubinemia and ascites/anasarca -No abdominal pain, but some nausea and vomiting noted last night -LFTs and bilirubin levels downtrending -Continue to monitor repeat labs -Appreciate GI evaluation -Hold diuretics for now given soft blood pressure readings -INR noted to be 2.2 with vitamin K to be given today -Monitor daily weights noted to be 188 pounds on admission and 189.6 on 9/12 -Patient noted to be at 212 pounds on 11/02/2018  Lactic acidosis-resolved -Continue gentle fluid and repeat in a.m. -Appears to be related to dehydration -Continue cefepime empirically for now and discontinue vancomycin -Follow blood cultures  Questionable AKI versus developing CKD stage III-4 related to intravascular volume depletion and ischemic ATN-improved -Creatinine currently noted to be 1.28 with prior noted to be 1.73 -Continue to monitor strict I's and O's and avoid nephrotoxic agents -Currently positive fluid balance of 718 with urine output of 650 -Weight stable at 188 pounds -Monitor repeat labs in a.m.  Sinus tachycardia in the setting of SVT/atrial tachycardia- persistent -Likely related to dehydration with lactic acidosis -Avoid d-dimer at the moment as patient is not hypoxemic -Recent TSH 5.4 and echo with normal EF  Anemia of chronic disease- stable -No overt bleeding identified, likely secondary to hemoconcentration -Repeat CBC in a.m.  Type 2 diabetes with mild hyperglycemia -Recent hemoglobin A1c 4.7% with metformin  discontinued -Monitor closely  on carb modified diet  Left lower extremity stasis dermatitis -Appears resolved at the moment  Hypothyroidism -Continue levothyroxine with recent TSH 5.4  Dyslipidemia -Continue statin   DVT prophylaxis:  SCDs Code Status: Full Family Communication: Discussed with daughter on 9/11 Disposition Plan:  Albumin infusion and vitamin K administration per GI recommendations.  Monitor a.m. labs.  Consider paracentesis prior to discharge.   Consultants:   GI  Procedures:   Abdominal MRI 9/11  Antimicrobials:  Anti-infectives (From admission, onward)   Start     Dose/Rate Route Frequency Ordered Stop   12/04/18 1815  rifaximin (XIFAXAN) tablet 550 mg     550 mg Oral 2 times daily 12/04/18 1801     12/04/18 1400  vancomycin (VANCOCIN) IVPB 1000 mg/200 mL premix  Status:  Discontinued     1,000 mg 200 mL/hr over 60 Minutes Intravenous Every 24 hours 12/03/18 1224 12/04/18 0646   12/04/18 1300  ceFEPIme (MAXIPIME) 1 g in sodium chloride 0.9 % 100 mL IVPB     1 g 200 mL/hr over 30 Minutes Intravenous Every 24 hours 12/03/18 1220     12/03/18 1400  vancomycin (VANCOCIN) IVPB 1000 mg/200 mL premix     1,000 mg 200 mL/hr over 60 Minutes Intravenous  Once 12/03/18 1222     12/03/18 1300  vancomycin (VANCOCIN) IVPB 1000 mg/200 mL premix     1,000 mg 200 mL/hr over 60 Minutes Intravenous  Once 12/03/18 1222 12/03/18 1542   12/03/18 1215  ceFEPIme (MAXIPIME) 2 g in sodium chloride 0.9 % 100 mL IVPB     2 g 200 mL/hr over 30 Minutes Intravenous  Once 12/03/18 1214 12/03/18 1342   12/03/18 1215  metroNIDAZOLE (FLAGYL) IVPB 500 mg     500 mg 100 mL/hr over 60 Minutes Intravenous  Once 12/03/18 1214 12/03/18 1542   12/03/18 1215  vancomycin (VANCOCIN) IVPB 1000 mg/200 mL premix  Status:  Discontinued     1,000 mg 200 mL/hr over 60 Minutes Intravenous  Once 12/03/18 1214 12/03/18 1222       Subjective: Patient seen and evaluated today with no new  acute complaints or concerns. No acute concerns or events noted overnight.  Her blood pressure readings have been quite soft and she remains mildly tachycardic.  Ammonia levels increasing, but mentation appears stable.  Objective: Vitals:   12/05/18 1014 12/05/18 1100 12/05/18 1146 12/05/18 1225  BP:  (!) 103/50  (!) 103/49  Pulse:  (!) 117  (!) 122  Resp: 20 (!) 28  (!) 25  Temp:   98.4 F (36.9 C)   TempSrc:   Oral   SpO2:  98%  96%  Weight:      Height:        Intake/Output Summary (Last 24 hours) at 12/05/2018 1250 Last data filed at 12/05/2018 1011 Gross per 24 hour  Intake 284.25 ml  Output 850 ml  Net -565.75 ml   Filed Weights   12/03/18 1625 12/04/18 0500 12/05/18 0433  Weight: 85.7 kg 85.7 kg 86 kg    Examination:  General exam: Appears calm and comfortable  Respiratory system: Clear to auscultation. Respiratory effort normal. Cardiovascular system: S1 & S2 heard, tachycardic, regular rate. No JVD, murmurs, rubs, gallops or clicks. No pedal edema. Gastrointestinal system: Abdomen is distended, soft and nontender. No organomegaly or masses felt. Normal bowel sounds heard. Central nervous system: Alert and oriented. No focal neurological deficits. Extremities: No significant edema Skin: No rashes, lesions or ulcers Psychiatry: Flat affect  Data Reviewed: I have personally reviewed following labs and imaging studies  CBC: Recent Labs  Lab 12/03/18 1054 12/04/18 0511 12/05/18 0508  WBC 14.3* 11.0* 10.0  HGB 11.1* 9.1* 9.3*  HCT 34.2* 28.0* 28.8*  MCV 96.6 97.6 99.0  PLT 201 143* 332*   Basic Metabolic Panel: Recent Labs  Lab 12/03/18 1054 12/04/18 0511 12/05/18 0508  NA 131* 136 136  K 4.0 3.5 4.0  CL 94* 104 104  CO2 25 24 25   GLUCOSE 160* 95 117*  BUN 35* 29* 29*  CREATININE 1.73* 1.28* 1.36*  CALCIUM 8.8* 8.3* 8.4*  MG  --  2.1  --    GFR: Estimated Creatinine Clearance: 37.7 mL/min (A) (by C-G formula based on SCr of 1.36 mg/dL  (H)). Liver Function Tests: Recent Labs  Lab 12/03/18 1054 12/04/18 0511 12/05/18 0508  AST 77* 52* 54*  ALT 37 26 27  ALKPHOS 84 61 80  BILITOT 4.6* 3.5* 2.9*  PROT 7.3 5.8* 5.9*  ALBUMIN 3.0* 2.2* 2.2*   Recent Labs  Lab 12/03/18 1054  LIPASE 39   Recent Labs  Lab 12/03/18 1055 12/04/18 0511 12/05/18 0508  AMMONIA 95* 64* 87*   Coagulation Profile: Recent Labs  Lab 12/04/18 0511 12/05/18 0508  INR 2.2* 2.1*   Cardiac Enzymes: No results for input(s): CKTOTAL, CKMB, CKMBINDEX, TROPONINI in the last 168 hours. BNP (last 3 results) No results for input(s): PROBNP in the last 8760 hours. HbA1C: No results for input(s): HGBA1C in the last 72 hours. CBG: Recent Labs  Lab 12/03/18 1108  GLUCAP 157*   Lipid Profile: No results for input(s): CHOL, HDL, LDLCALC, TRIG, CHOLHDL, LDLDIRECT in the last 72 hours. Thyroid Function Tests: No results for input(s): TSH, T4TOTAL, FREET4, T3FREE, THYROIDAB in the last 72 hours. Anemia Panel: No results for input(s): VITAMINB12, FOLATE, FERRITIN, TIBC, IRON, RETICCTPCT in the last 72 hours. Sepsis Labs: Recent Labs  Lab 12/03/18 1054 12/03/18 1249 12/04/18 0511 12/05/18 0508  LATICACIDVEN 4.1* 2.8* 1.3 1.3    Recent Results (from the past 240 hour(s))  Culture, blood (routine x 2)     Status: None (Preliminary result)   Collection Time: 12/03/18 10:54 AM   Specimen: BLOOD LEFT ARM  Result Value Ref Range Status   Specimen Description BLOOD LEFT ARM DRAWN BY RN  Final   Special Requests   Final    BOTTLES DRAWN AEROBIC AND ANAEROBIC Blood Culture results may not be optimal due to an excessive volume of blood received in culture bottles   Culture   Final    NO GROWTH 2 DAYS Performed at [facility], [address], [place], [place] [address]    Report Status PENDING  Incomplete  SARS Coronavirus 2 [facility] order, Performed in [place] hospital lab) Nasopharyngeal Nasopharyngeal Swab     Status: None    Collection Time: 12/03/18 11:32 AM   Specimen: Nasopharyngeal Swab  Result Value Ref Range Status   SARS Coronavirus 2 NEGATIVE NEGATIVE Final    Comment: (NOTE) If result is NEGATIVE SARS-CoV-2 target nucleic acids are NOT DETECTED. The SARS-CoV-2 RNA is generally detectable in upper and lower  respiratory specimens during the acute phase of infection. The lowest  concentration of SARS-CoV-2 viral copies this assay can detect is 250  copies / mL. A negative result does not preclude SARS-CoV-2 infection  and should not be used as the sole basis for treatment or other  patient management decisions.  A negative result may occur with  improper  specimen collection / handling, submission of specimen other  than nasopharyngeal swab, presence of viral mutation(s) within the  areas targeted by this assay, and inadequate number of viral copies  (<250 copies / mL). A negative result must be combined with clinical  observations, patient history, and epidemiological information. If result is POSITIVE SARS-CoV-2 target nucleic acids are DETECTED. The SARS-CoV-2 RNA is generally detectable in upper and lower  respiratory specimens dur ing the acute phase of infection.  Positive  results are indicative of active infection with SARS-CoV-2.  Clinical  correlation with patient history and other diagnostic information is  necessary to determine patient infection status.  Positive results do  not rule out bacterial infection or co-infection with other viruses. If result is PRESUMPTIVE POSTIVE SARS-CoV-2 nucleic acids MAY BE PRESENT.   A presumptive positive result was obtained on the submitted specimen  and confirmed on repeat testing.  While 2019 novel coronavirus  (SARS-CoV-2) nucleic acids may be present in the submitted sample  additional confirmatory testing may be necessary for epidemiological  and / or clinical management purposes  to differentiate between  SARS-CoV-2 and other Sarbecovirus  currently known to infect humans.  If clinically indicated additional testing with an alternate test  methodology [phone]) is advised. The SARS-CoV-2 RNA is generally  detectable in upper and lower respiratory sp ecimens during the acute  phase of infection. The expected result is Negative. Fact Sheet for Patients:  StrictlyIdeas.no Fact Sheet for Healthcare Providers: BankingDealers.co.za This test is not yet approved or cleared by the [place] FDA and has been authorized for detection and/or diagnosis of SARS-CoV-2 by FDA under an Emergency Use Authorization (EUA).  This EUA will remain in effect (meaning this test can be used) for the duration of the COVID-19 declaration under Section 564(b)(1) of the Act, 21 U.S.C. section 360bbb-3(b)(1), unless the authorization is terminated or revoked sooner. Performed at [facility], [address], [place], [place] [address]   Culture, blood (routine x 2)     Status: None (Preliminary result)   Collection Time: 12/03/18 12:49 PM   Specimen: BLOOD RIGHT HAND  Result Value Ref Range Status   Specimen Description BLOOD RIGHT HAND  Final   Special Requests   Final    BOTTLES DRAWN AEROBIC AND ANAEROBIC Blood Culture adequate volume   Culture   Final    NO GROWTH 2 DAYS Performed at [facility], [address], [place], [place] [address]    Report Status PENDING  Incomplete  MRSA PCR Screening     Status: None   Collection Time: 12/03/18  4:25 PM   Specimen: Nasopharyngeal  Result Value Ref Range Status   MRSA by PCR NEGATIVE NEGATIVE Final    Comment:        The GeneXpert MRSA Assay (FDA approved for NASAL specimens only), is one component of a comprehensive MRSA colonization surveillance program. It is not intended to diagnose MRSA infection nor to guide or monitor treatment for MRSA infections. Performed at [facility] [address] [place], [place] [address]           Radiology Studies: Ct Head Wo Contrast  Result Date: 12/03/2018 CLINICAL DATA:  Altered level of consciousness. EXAM: CT HEAD WITHOUT CONTRAST TECHNIQUE: Contiguous axial images were obtained from the base of the skull through the vertex without intravenous contrast. COMPARISON:  None. FINDINGS: Brain: No evidence of acute infarction, hemorrhage, hydrocephalus, extra-axial collection or mass lesion/mass effect. Mild brain parenchymal volume loss and deep white matter  microangiopathy. Vascular: Calcific atherosclerotic disease of the intra cavernous carotid arteries, mild. Skull: Normal. Negative for fracture or focal lesion. Sinuses/Orbits: No acute finding. Other: None. IMPRESSION: No acute intracranial abnormality. Electronically Signed   By: Fidela Salisbury M.D.   On: 12/03/2018 14:11   Mr Abdomen W Wo Contrast  Result Date: 12/04/2018 CLINICAL DATA:  Cirrhosis and ascites. EXAM: MRI ABDOMEN WITHOUT AND WITH CONTRAST TECHNIQUE: Multiplanar multisequence MR imaging of the abdomen was performed both before and after the administration of intravenous contrast. CONTRAST:  64mL GADAVIST GADOBUTROL 1 MMOL/ML IV SOLN COMPARISON:  CT scan 10/25/2018 FINDINGS: Lower chest: There is a small left pleural effusion and overlying left lower lobe atelectasis. No pericardial effusion. Hepatobiliary: Advanced cirrhotic changes involving the liver. The liver is small and demonstrates marked cortical irregularity with dilated hepatic fissures and increased caudate to right lobe ratio. There is portal venous hypertension with portal venous collaterals and paraesophageal varices. The portal and hepatic veins are patent. No worrisome early arterial phase enhancing lesions to suggest hepatoma or dysplastic nodules. The gallbladder is mildly distended. No gallstones are identified. No common bile duct dilatation. Pancreas: No mass, inflammation or ductal dilatation. Moderate pancreatic atrophy. Spleen:  Upper  limits of normal in size.  No focal lesions. Adrenals/Urinary Tract: The adrenal glands and kidneys are unremarkable. Stomach/Bowel: Visualized portions within the abdomen are unremarkable. Vascular/Lymphatic: The aorta and branch vessels are patent. The major venous structures are patent. Small scattered mesenteric and retroperitoneal lymph nodes but no mass or overt adenopathy. Other: Large volume abdominal ascites. No obvious omental or peritoneal surface lesions. Musculoskeletal: No significant bony findings. IMPRESSION: 1. Advanced cirrhotic changes involving the liver as detailed above. There is portal venous hypertension, portal venous collaterals and paraesophageal varices. 2. No worrisome early arterial phase enhancing liver lesions to suggest hepatoma or dysplastic nodules. 3. Large volume abdominal ascites. 4. Left pleural effusion with overlying atelectasis. Electronically Signed   By: Marijo Sanes M.D.   On: 12/04/2018 14:21        Scheduled Meds:  aspirin EC  81 mg Oral QPM   atorvastatin  10 mg Oral q1800   Chlorhexidine Gluconate Cloth  6 each Topical Daily   diltiazem  120 mg Oral Daily   feeding supplement (ENSURE ENLIVE)  237 mL Oral Q1400   lactulose  10 g Oral TID   levothyroxine  75 mcg Oral QAC breakfast   multivitamin with minerals  1 tablet Oral Daily   nystatin   Topical TID   pantoprazole  40 mg Oral Daily   phytonadione  5 mg Subcutaneous Once   rifaximin  550 mg Oral BID   sodium chloride flush  3 mL Intravenous Once   sodium chloride flush  3 mL Intravenous Q12H   Continuous Infusions:  albumin human 25 g (12/05/18 1018)   ceFEPime (MAXIPIME) IV Stopped (12/04/18 1415)   vancomycin       LOS: 2 days    Time spent: 30 minutes    Anthony Roland Darleen Crocker, DO [facility] Pager [phone]  If 7PM-7AM, please contact night-coverage [email or web address] Password [email or web address] 12/05/2018, 12:50 PM

## 2018-12-05 NOTE — Evaluation (Signed)
Physical Therapy Evaluation Patient Details Name: Jordan Le MRN: [id number] DOB: [birth year] Today's Date: 12/05/2018   History of Present Illness  Jordan Le is a 73 y.o. 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  Her last known normal was approximately 830 yesterday evening.  She was at her doctor's appointment for routine lab work this morning and was told to come to the emergency department on account of her confusion.  She is also noted to be incontinent of stool and usually struggles with severe constipation and uses suppositories on a regular basis.  She sees Dr. Oneida Alar as her gastroenterologist who recently diagnosed her with liver cirrhosis and was also concerned about elevated bilirubin at the end of August.    Clinical Impression  Patient cooperative with therapy and can follow directions consistently.  Patient demonstrates slightly labored movement for sitting up at bedside without assistance, unsteady on feet with near loss of balance when not using AD, required use of RW for safety and demonstrated good tolerance for ambulation in hallways without loss of balance.  Patient will benefit from continued physical therapy in hospital and recommended venue below to increase strength, balance, endurance for safe ADLs and gait.    Follow Up Recommendations Home health PT;Supervision for mobility/OOB;Supervision - Intermittent    Equipment Recommendations  None recommended by PT    Recommendations for Other Services       Precautions / Restrictions Precautions Precautions: Fall Restrictions Weight Bearing Restrictions: No       Mobility  Bed Mobility Overal bed mobility: Needs Assistance Bed Mobility: Supine to Sit     Supine to sit: Supervision     General bed mobility comments: increased time, slightly labored  Transfers Overall transfer level: Needs assistance Equipment used: None;Rolling walker (2 wheeled) Transfers: Sit to/from [employer] Transfers Sit to Stand: Supervision;Min guard Stand pivot transfers: Supervision;Min guard       General transfer comment: unsteady with swaying when not using an AD, safer with RW  Ambulation/Gait Ambulation/Gait assistance: Supervision;Min guard Gait Distance (Feet): 100 Feet Assistive device: Rolling walker (2 wheeled) Gait Pattern/deviations: Decreased step length - right;Decreased step length - left;Decreased stride length Gait velocity: decreased   General Gait Details: slightly labored cadence without loss of balance, occasional bumping into nearby objects with RW  Stairs            Wheelchair Mobility    Modified Rankin (Stroke Patients Only)       Balance Overall balance assessment: Needs assistance Sitting-balance support: Feet supported;No upper extremity supported Sitting balance-Leahy Scale: Good     Standing balance support: During functional activity;No upper extremity supported Standing balance-Leahy Scale: Poor Standing balance comment: fair using RW                             Pertinent Vitals/Pain Pain Assessment: No/denies pain    Home Living Family/patient expects to be discharged to:: Private residence Living Arrangements: Spouse/significant other;Other relatives Available Help at Discharge: Family;Available 24 hours/day Type of Home: House Home Access: Stairs to enter Entrance Stairs-Rails: Right;Left;Can reach both Entrance Stairs-Number of Steps: 4-5 Home Layout: One level Home Equipment: Shower seat;Bedside commode;Cane - single  point;Walker - 2 wheels;Wheelchair - manual      Prior  Function Level of Independence: Independent         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        Extremity/Trunk Assessment   Upper Extremity Assessment Upper Extremity Assessment: Generalized weakness    Lower Extremity Assessment Lower Extremity Assessment: Generalized weakness    Cervical / Trunk Assessment Cervical / Trunk Assessment: Normal  Communication   Communication: No difficulties  Cognition Arousal/Alertness: Awake/alert Behavior During Therapy: WFL for tasks assessed/performed Overall Cognitive Status: Within Functional Limits for tasks assessed                                        General Comments      Exercises     Assessment/Plan    PT Assessment Patient needs continued PT services  PT Problem List Decreased strength;Decreased activity tolerance;Decreased balance;Decreased mobility       PT Treatment Interventions Gait training;Stair training;Balance training;Functional mobility training;Therapeutic activities;Therapeutic exercise;Patient/family education    PT Goals (Current goals can be found in the Care Plan section)  Acute Rehab PT Goals Patient Stated Goal: return home with family to assist PT Goal Formulation: With patient/family Time For Goal Achievement: 12/09/18 Potential to Achieve Goals: Good    Frequency Min 3X/week   Barriers to discharge        Co-evaluation               AM-PAC PT "6 Clicks" Mobility  Outcome Measure Help needed turning from your back to your side while in a flat bed without using bedrails?: None Help needed moving from lying on your back to sitting on the side of a flat bed without using bedrails?: None Help needed moving to and from a bed to a chair (including a wheelchair)?: A Little Help needed standing up from a chair using your arms (e.g., wheelchair or bedside chair)?: A Little Help needed to walk in hospital room?: A  Little Help needed climbing 3-5 steps with a railing? : A Lot 6 Click Score: 19    End of Session   Activity Tolerance: Patient tolerated treatment well;Patient limited by fatigue Patient left: in chair;with call bell/phone within reach;with family/visitor present Nurse Communication: Mobility status PT Visit Diagnosis: Unsteadiness on feet (R26.81);Other abnormalities of gait and mobility (R26.89);Muscle weakness (generalized) (M62.81)    Time: 1105-1140 PT Time Calculation (min) (ACUTE ONLY): 35 min   Charges:   PT Evaluation $PT Eval Moderate Complexity: 1 Mod PT Treatments $Therapeutic Activity: 23-37 mins        12:27 PM, 12/05/18 Lonell Grandchild, MPT Physical Therapist with [facility] [phone] office [phone] mobile phone

## 2018-12-05 NOTE — Progress Notes (Signed)
Dr. Manuella Ghazi notified of low blood pressure when patient lying on R. Side 80/36 Map 52 and blood pressure returning to normal when patient is positioned on back 110/76 Map 75.

## 2018-12-06 LAB — COMPREHENSIVE METABOLIC PANEL
ALT: 21 U/L (ref 0–44)
AST: 45 U/L — ABNORMAL HIGH (ref 15–41)
Albumin: 2.8 g/dL — ABNORMAL LOW (ref 3.5–5.0)
Alkaline Phosphatase: 67 U/L (ref 38–126)
Anion gap: 8 (ref 5–15)
BUN: 30 mg/dL — ABNORMAL HIGH (ref 8–23)
CO2: 24 mmol/L (ref 22–32)
Calcium: 8.6 mg/dL — ABNORMAL LOW (ref 8.9–10.3)
Chloride: 101 mmol/L (ref 98–111)
Creatinine, Ser: 1.28 mg/dL — ABNORMAL HIGH (ref 0.44–1.00)
GFR calc Af Amer: 48 mL/min — ABNORMAL LOW (ref >60)
GFR calc non Af Amer: 41 mL/min — ABNORMAL LOW (ref >60)
Glucose, Bld: 108 mg/dL — ABNORMAL HIGH (ref 70–99)
Potassium: 4 mmol/L (ref 3.5–5.1)
Sodium: 133 mmol/L — ABNORMAL LOW (ref 135–145)
Total Bilirubin: 3.7 mg/dL — ABNORMAL HIGH (ref 0.3–1.2)
Total Protein: 6 g/dL — ABNORMAL LOW (ref 6.5–8.1)

## 2018-12-06 LAB — CBC
HCT: 27.7 % — ABNORMAL LOW (ref 36.0–46.0)
Hemoglobin: 8.9 g/dL — ABNORMAL LOW (ref 12.0–15.0)
MCH: 31.8 pg (ref 26.0–34.0)
MCHC: 32.1 g/dL (ref 30.0–36.0)
MCV: 98.9 fL (ref 80.0–100.0)
Platelets: 126 10*3/uL — ABNORMAL LOW (ref 150–400)
RBC: 2.8 MIL/uL — ABNORMAL LOW (ref 3.87–5.11)
RDW: 16.2 % — ABNORMAL HIGH (ref 11.5–15.5)
WBC: 8.7 10*3/uL (ref 4.0–10.5)
nRBC: 0 % (ref 0.0–0.2)

## 2018-12-06 LAB — LACTIC ACID, PLASMA: Lactic Acid, Venous: 1.5 mmol/L (ref 0.5–1.9)

## 2018-12-06 LAB — PROTIME-INR
INR: 2.3 — ABNORMAL HIGH (ref 0.8–1.2)
Prothrombin Time: 25.3 seconds — ABNORMAL HIGH (ref 11.4–15.2)

## 2018-12-06 LAB — AMMONIA: Ammonia: 45 umol/L — ABNORMAL HIGH (ref 9–35)

## 2018-12-06 MED ORDER — LACTULOSE 10 GM/15ML PO SOLN
20.0000 g | Freq: Three times a day (TID) | ORAL | Status: DC
  Administered 2018-12-06 – 2018-12-08 (×6): 20 g via ORAL
  Filled 2018-12-06 (×6): qty 30

## 2018-12-06 MED ORDER — DOCUSATE SODIUM 100 MG PO CAPS
100.0000 mg | ORAL_CAPSULE | Freq: Every day | ORAL | Status: DC
  Administered 2018-12-06 – 2018-12-08 (×3): 100 mg via ORAL
  Filled 2018-12-06 (×3): qty 1

## 2018-12-06 NOTE — Progress Notes (Signed)
Subjective:  Patient has no complaints.  She denies shortness of breath chest or abdominal pain.  She says she had a bowel movement this morning and she passed hard balls.  She denies melena or rectal bleeding.  Objective: Blood pressure (!) 108/49, pulse 93, temperature 98.6 F (37 C), temperature source Oral, resp. rate (!) 25, height _0  (1.6 m), weight 87 kg, SpO2 97 %. Patient is alert and in no acute distress. Sclera does not appear to be icteric. Cardiac exam with regular rhythm normal S1 and S2.  She does have faint systolic murmur best heard at aortic area. Auscultation of lungs reveal vesicular breath sounds bilaterally. Abdomen is distended but not tense or tender. Trace edema involving her ankles and both feet.  Labs/studies Results:   CBC Latest Ref Rng & Units 12/06/2018 12/05/2018 12/04/2018  WBC 4.0 - 10.5 K/uL 8.7 10.0 11.0(H)  Hemoglobin 12.0 - 15.0 g/dL 8.9(L) 9.3(L) 9.1(L)  Hematocrit 36.0 - 46.0 % 27.7(L) 28.8(L) 28.0(L)  Platelets 150 - 400 K/uL 126(L) 138(L) 143(L)    CMP Latest Ref Rng & Units 12/06/2018 12/05/2018 12/04/2018  Glucose 70 - 99 mg/dL 108(H) 117(H) 95  BUN 8 - 23 mg/dL 30(H) 29(H) 29(H)  Creatinine 0.44 - 1.00 mg/dL 1.28(H) 1.36(H) 1.28(H)  Sodium 135 - 145 mmol/L 133(L) 136 136  Potassium 3.5 - 5.1 mmol/L 4.0 4.0 3.5  Chloride 98 - 111 mmol/L 101 104 104  CO2 22 - 32 mmol/L _1 Calcium 8.9 - 10.3 mg/dL 8.6(L) 8.4(L) 8.3(L)  Total Protein 6.5 - 8.1 g/dL 6.0(L) 5.9(L) 5.8(L)  Total Bilirubin 0.3 - 1.2 mg/dL 3.7(H) 2.9(H) 3.5(H)  Alkaline Phos 38 - 126 U/L 67 80 61  AST 15 - 41 U/L 45(H) 54(H) 52(H)  ALT 0 - 44 U/L _2 Hepatic Function Latest Ref Rng & Units 12/06/2018 12/05/2018 12/04/2018  Total Protein 6.5 - 8.1 g/dL 6.0(L) 5.9(L) 5.8(L)  Albumin 3.5 - 5.0 g/dL 2.8(L) 2.2(L) 2.2(L)  AST 15 - 41 U/L 45(H) 54(H) 52(H)  ALT 0 - 44 U/L _3 Alk Phosphatase 38 - 126 U/L 67 80 61  Total Bilirubin 0.3 - 1.2 mg/dL 3.7(H) 2.9(H)  3.5(H)  Bilirubin, Direct 0.0 - 0.2 mg/dL - - -    Blood cultures remain negative on day 3. INR 2.3 Serum ammonia 45  Stroop test.  Her time was 27 seconds yesterday and today was 21.5 seconds.   Assessment:  #1.  Hepatic encephalopathy.  Clinically she does not appear to be encephalopathic.  She did better on Stroop test than she did yesterday.  She is passing hard balls.  She needs to have 3-4 bowel movements per day.  She would benefit from higher dose of lactulose.  #2.  Lactic acidosis on admission.  Blood cultures have remained negative.  IV antibiotic discontinued.  #3.  Ascites.  Ascites appears to be gradually increasing.  Although she is not symptomatic yet.  She will need abdominal tap.  #4.  Cirrhosis.  She has advanced cirrhosis.  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 INR has not yet corrected with vitamin K.  Will recheck in a.m.  #5.  Anemia secondary to chronic illness.  H&H gradually decreasing but no evidence of overt GI bleed.  #6.  History of SVT.  Patient is in sinus rhythm and heart rate is around 90.  #7.  Kidney disease.  Renal function is improving.  Suspect she may have underlying chronic kidney disease  secondary to diabetes mellitus   Recommendations  LVAP on 12/07/2018. Increase lactulose dose to 20 g p.o. 3 times daily. Reevaluate need to continue low-dose aspirin as risk may outweigh benefit. INR in a.m.

## 2018-12-06 NOTE — Progress Notes (Signed)
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 Her last known normal was approximately 830 yesterday evening. She was at her doctor's appointment for routine lab work this morning and was told to come to the emergency department on account of her confusion. She is also noted to be incontinent of stool and usually struggles with severe constipation and uses suppositories on a regular basis. She sees Dr. Oneida Alar as her gastroenterologist who recently diagnosed her with liver cirrhosis and was also concerned about elevated bilirubin at the end of August.  9/11:Patient appears improved this morning with lower lactic acid levels. Will discontinue vancomycin as MRSA nares is negative. Blood cultures negative thus far. Continue cefepime empirically for now. GI consultation pending. Ammonia levels have decreased and mentation has improved. Continue gentle IV fluid as patient is not quite tolerating diet and avoid diuretics for now.  9/12: Patient appears clinically improved, but maintaining very soft blood pressure readings as well as some tachycardia.  INR elevated and GI to administer vitamin K.  We will also order albumin infusion after discussion with GI given soft blood pressure readings.  May need paracentesis prior to discharge.  We will continue cefepime empirically for now.  Ammonia levels still elevated and rifaximin started 9/11.  MRI abdomen with noted large volume paracentesis, but  no focal abnormalities.  9/13: Patient still continues to have some mild tachycardia, but blood pressure readings have improved with albumin infusion.  She continues to feel well otherwise and is alert and oriented x3.  She has some difficulty with her bowel movements and requires stool softener.  INR still elevated despite vitamin K administration.  Assessment & Plan:   Active Problems:   Acute hepatic encephalopathy   Hepatic encephalopathy (HCC)   Lactic acidosis   Dehydration   Acute hepatic encephalopathy in the setting ofsuspected NASH liver cirrhosis- resolved -Continue lactulose and Xifaxan as ordered by GI -Continue to monitor ammonia levels in a.m. -Appreciate GI evaluation and management -Continue monitor closely with neurochecks  NASHliver cirrhosis with hyperbilirubinemia and ascites/anasarca -No abdominal pain, but some nausea and vomiting noted last night -LFTs and bilirubin levels downtrending -Continue to monitor repeat labs -Appreciate GI evaluation -Hold diuretics for now given soft blood pressure readings -INR noted to be 2.3 with vitamin K given on 9/12, may need to repeat dose per GI -Monitor daily weightsnoted to be 188 pounds on admission and 189.6 on 9/12 -Patient noted to be at 212 pounds on 11/02/2018  Hypotension related to ascites and hypoalbuminemia -Improving with albumin infusions -Improvement in albumin level noted this a.m. -Continue to monitor closely in stepdown unit for now  Lactic acidosis- mildly persistent -No further IV fluid at this time -Appears to be related to dehydration -Discontinue cefepime as blood cultures have remained negative x2 days, with no active source of infection  Questionable AKI versus developing CKD stage III-4 related to intravascular volume depletion and ischemic ATN-improved -Creatinine currently noted to be 1.28with prior noted to be 1.73 -Continue to monitor strict I's and O's and avoid nephrotoxic  agents -Currently positive fluid  balance of 718 with urine output of 650 -Weight stable at 189 pounds -Monitor repeat labs in a.m.  Sinus tachycardia in the setting of SVT/atrial tachycardia- persistent -Likely related to dehydration with associated lactic acidosis -Avoid d-dimer at the moment as patient is not hypoxemic -Recent TSH 5.4 and echo with normal EF  Anemia of chronic disease- stable -No overt bleeding identified, likely secondary to hemoconcentration -Repeat CBC in a.m.  Type 2 diabetes with mild hyperglycemia -Recent hemoglobin A1c 4.7% with metformin discontinued -Monitor closely on carb modified diet  Left lower extremity stasis dermatitis -Appears resolved at the moment  Hypothyroidism -Continue levothyroxine with recent TSH 5.4  Dyslipidemia -Continue statin   DVT prophylaxis: SCDs Code Status:Full Family Communication:Discussed with daughter on 9/11 Disposition Plan: Albumin infusion and vitamin K administration per GI recommendations.  Monitor a.m. labs.  Consider paracentesis prior to discharge.   Consultants:  GI  Procedures:  Abdominal MRI 9/11  Antimicrobials:  Anti-infectives (From admission, onward)   Start     Dose/Rate Route Frequency Ordered Stop   12/04/18 1815  rifaximin (XIFAXAN) tablet 550 mg     550 mg Oral 2 times daily 12/04/18 1801     12/04/18 1400  vancomycin (VANCOCIN) IVPB 1000 mg/200 mL premix  Status:  Discontinued     1,000 mg 200 mL/hr over 60 Minutes Intravenous Every 24 hours 12/03/18 1224 12/04/18 0646   12/04/18 1300  ceFEPIme (MAXIPIME) 1 g in sodium chloride 0.9 % 100 mL IVPB  Status:  Discontinued     1 g 200 mL/hr over 30 Minutes Intravenous Every 24 hours 12/03/18 1220 12/06/18 1059   12/03/18 1400  vancomycin (VANCOCIN) IVPB 1000 mg/200 mL premix  Status:  Discontinued     1,000 mg 200 mL/hr over 60 Minutes Intravenous  Once 12/03/18 1222 12/06/18 1059   12/03/18 1300  vancomycin (VANCOCIN)  IVPB 1000 mg/200 mL premix     1,000 mg 200 mL/hr over 60 Minutes Intravenous  Once 12/03/18 1222 12/03/18 1542   12/03/18 1215  ceFEPIme (MAXIPIME) 2 g in sodium chloride 0.9 % 100 mL IVPB     2 g 200 mL/hr over 30 Minutes Intravenous  Once 12/03/18 1214 12/03/18 1342   12/03/18 1215  metroNIDAZOLE (FLAGYL) IVPB 500 mg     500 mg 100 mL/hr over 60 Minutes Intravenous  Once 12/03/18 1214 12/03/18 1542   12/03/18 1215  vancomycin (VANCOCIN) IVPB 1000 mg/200 mL premix  Status:  Discontinued     1,000 mg 200 mL/hr over 60 Minutes Intravenous  Once 12/03/18 1214 12/03/18 1222       Subjective: Patient seen and evaluated today with no new acute complaints or concerns. No acute concerns or events noted overnight.  She states that she is having some difficulty with her bowel movements and requests a stool softener.  Objective: Vitals:   12/06/18 0947 12/06/18 0950 12/06/18 1000 12/06/18 1053  BP: (!) 107/56  (!) 108/49   Pulse: 95 92 90 93  Resp: (!) 31 14 (!) 26 (!) 25  Temp:    98.6 F (37 C)  TempSrc:    Oral  SpO2: 96%   97%  Weight:      Height:        Intake/Output Summary (Last 24 hours) at 12/06/2018 1100 Last data filed at 12/06/2018 0949 Gross per 24 hour  Intake 784.82 ml  Output 700 ml  Net 84.82 ml   Filed Weights   12/04/18 0500 12/05/18 0433 12/06/18 0625  Weight:  85.7 kg 86 kg 87 kg    Examination:  General exam: Appears calm and comfortable  Respiratory system: Clear to auscultation. Respiratory effort normal.  Currently on room air Cardiovascular system: S1 & S2 heard, RRR. No JVD, murmurs, rubs, gallops or clicks. No pedal edema. Gastrointestinal system: Abdomen is distended, soft and nontender. No organomegaly or masses felt. Normal bowel sounds heard. Central nervous system: Alert and oriented. No focal neurological deficits. Extremities: No significant edema Skin: No rashes, lesions or ulcers Psychiatry: Judgement and insight appear normal. Mood &  affect appropriate.     Data Reviewed: I have personally reviewed following labs and imaging studies  CBC: Recent Labs  Lab 12/03/18 1054 12/04/18 0511 12/05/18 0508 12/06/18 0456  WBC 14.3* 11.0* 10.0 8.7  HGB 11.1* 9.1* 9.3* 8.9*  HCT 34.2* 28.0* 28.8* 27.7*  MCV 96.6 97.6 99.0 98.9  PLT 201 143* 138* 417*   Basic Metabolic Panel: Recent Labs  Lab 12/03/18 1054 12/04/18 0511 12/05/18 0508 12/06/18 0456  NA 131* 136 136 133*  K 4.0 3.5 4.0 4.0  CL 94* 104 104 101  CO2 25 24 25 24   GLUCOSE 160* 95 117* 108*  BUN 35* 29* 29* 30*  CREATININE 1.73* 1.28* 1.36* 1.28*  CALCIUM 8.8* 8.3* 8.4* 8.6*  MG  --  2.1  --   --    GFR: Estimated Creatinine Clearance: 40.3 mL/min (A) (by C-G formula based on SCr of 1.28 mg/dL (H)). Liver Function Tests: Recent Labs  Lab 12/03/18 1054 12/04/18 0511 12/05/18 0508 12/06/18 0456  AST 77* 52* 54* 45*  ALT 37 26 27 21   ALKPHOS 84 61 80 67  BILITOT 4.6* 3.5* 2.9* 3.7*  PROT 7.3 5.8* 5.9* 6.0*  ALBUMIN 3.0* 2.2* 2.2* 2.8*   Recent Labs  Lab 12/03/18 1054  LIPASE 39   Recent Labs  Lab 12/03/18 1055 12/04/18 0511 12/05/18 0508 12/06/18 0455  AMMONIA 95* 64* 87* 45*   Coagulation Profile: Recent Labs  Lab 12/04/18 0511 12/05/18 0508 12/06/18 0456  INR 2.2* 2.1* 2.3*   Cardiac Enzymes: No results for input(s): CKTOTAL, CKMB, CKMBINDEX, TROPONINI in the last 168 hours. BNP (last 3 results) No results for input(s): PROBNP in the last 8760 hours. HbA1C: No results for input(s): HGBA1C in the last 72 hours. CBG: Recent Labs  Lab 12/03/18 1108  GLUCAP 157*   Lipid Profile: No results for input(s): CHOL, HDL, LDLCALC, TRIG, CHOLHDL, LDLDIRECT in the last 72 hours. Thyroid Function Tests: No results for input(s): TSH, T4TOTAL, FREET4, T3FREE, THYROIDAB in the last 72 hours. Anemia Panel: No results for input(s): VITAMINB12, FOLATE, FERRITIN, TIBC, IRON, RETICCTPCT in the last 72 hours. Sepsis Labs: Recent Labs   Lab 12/03/18 1249 12/04/18 0511 12/05/18 0508 12/06/18 0455  LATICACIDVEN 2.8* 1.3 1.3 1.5    Recent Results (from the past 240 hour(s))  Culture, blood (routine x 2)     Status: None (Preliminary result)   Collection Time: 12/03/18 10:54 AM   Specimen: BLOOD LEFT ARM  Result Value Ref Range Status   Specimen Description BLOOD LEFT ARM DRAWN BY RN  Final   Special Requests   Final    BOTTLES DRAWN AEROBIC AND ANAEROBIC Blood Culture results may not be optimal due to an excessive volume of blood received in culture bottles   Culture   Final    NO GROWTH 2 DAYS Performed at [facility] [address], [place], [place] [address]    Report Status PENDING  Incomplete  SARS  Coronavirus 2 [facility] order, Performed in [facility] hospital lab) Nasopharyngeal Nasopharyngeal Swab     Status: None   Collection Time: 12/03/18 11:32 AM   Specimen: Nasopharyngeal Swab  Result Value Ref Range Status   SARS Coronavirus 2 NEGATIVE NEGATIVE Final    Comment: (NOTE) If result is NEGATIVE SARS-CoV-2 target nucleic acids are NOT DETECTED. The SARS-CoV-2 RNA is generally detectable in upper and lower  respiratory specimens during the acute phase of infection. The lowest  concentration of SARS-CoV-2 viral copies this assay can detect is 250  copies / mL. A negative result does not preclude SARS-CoV-2 infection  and should not be used as the sole basis for treatment or other  patient management decisions.  A negative result may occur with  improper specimen collection / handling, submission of specimen other  than nasopharyngeal swab, presence of viral mutation(s) within the  areas targeted by this assay, and inadequate number of viral copies  (<250 copies / mL). A negative result must be combined with clinical  observations, patient history, and epidemiological information. If result is POSITIVE SARS-CoV-2 target nucleic acids are DETECTED. The SARS-CoV-2 RNA is generally detectable in upper  and lower  respiratory specimens dur ing the acute phase of infection.  Positive  results are indicative of active infection with SARS-CoV-2.  Clinical  correlation with patient history and other diagnostic information is  necessary to determine patient infection status.  Positive results do  not rule out bacterial infection or co-infection with other viruses. If result is PRESUMPTIVE POSTIVE SARS-CoV-2 nucleic acids MAY BE PRESENT.   A presumptive positive result was obtained on the submitted specimen  and confirmed on repeat testing.  While 2019 novel coronavirus  (SARS-CoV-2) nucleic acids may be present in the submitted sample  additional confirmatory testing may be necessary for epidemiological  and / or clinical management purposes  to differentiate between  SARS-CoV-2 and other Sarbecovirus currently known to infect humans.  If clinically indicated additional testing with an alternate test  methodology [phone]) is advised. The SARS-CoV-2 RNA is generally  detectable in upper and lower respiratory sp ecimens during the acute  phase of infection. The expected result is Negative. Fact Sheet for Patients:  StrictlyIdeas.no Fact Sheet for Healthcare Providers: BankingDealers.co.za This test is not yet approved or cleared by the [place] FDA and has been authorized for detection and/or diagnosis of SARS-CoV-2 by FDA under an Emergency Use Authorization (EUA).  This EUA will remain in effect (meaning this test can be used) for the duration of the COVID-19 declaration under Section 564(b)(1) of the Act, 21 U.S.C. section 360bbb-3(b)(1), unless the authorization is terminated or revoked sooner. Performed at [facility], [address], [place], [place] [address]   Culture, blood (routine x 2)     Status: None (Preliminary result)   Collection Time: 12/03/18 12:49 PM   Specimen: BLOOD RIGHT HAND  Result Value Ref Range Status    Specimen Description BLOOD RIGHT HAND  Final   Special Requests   Final    BOTTLES DRAWN AEROBIC AND ANAEROBIC Blood Culture adequate volume   Culture   Final    NO GROWTH 2 DAYS Performed at [facility], [address] [place], [place] [address]    Report Status PENDING  Incomplete  MRSA PCR Screening     Status: None   Collection Time: 12/03/18  4:25 PM   Specimen: Nasopharyngeal  Result Value Ref Range Status   MRSA by PCR NEGATIVE NEGATIVE Final  Comment:        The GeneXpert MRSA Assay (FDA approved for NASAL specimens only), is one component of a comprehensive MRSA colonization surveillance program. It is not intended to diagnose MRSA infection nor to guide or monitor treatment for MRSA infections. Performed at [facility], [address] [place], [place] [address]          Radiology Studies: Mr Abdomen W Wo Contrast  Result Date: 12/04/2018 CLINICAL DATA:  Cirrhosis and ascites. EXAM: MRI ABDOMEN WITHOUT AND WITH CONTRAST TECHNIQUE: Multiplanar multisequence MR imaging of the abdomen was performed both before and after the administration of intravenous contrast. CONTRAST:  22mL GADAVIST GADOBUTROL 1 MMOL/ML IV SOLN COMPARISON:  CT scan 10/25/2018 FINDINGS: Lower chest: There is a small left pleural effusion and overlying left lower lobe atelectasis. No pericardial effusion. Hepatobiliary: Advanced cirrhotic changes involving the liver. The liver is small and demonstrates marked cortical irregularity with dilated hepatic fissures and increased caudate to right lobe ratio. There is portal venous hypertension with portal venous collaterals and paraesophageal varices. The portal and hepatic veins are patent. No worrisome early arterial phase enhancing lesions to suggest hepatoma or dysplastic nodules. The gallbladder is mildly distended. No gallstones are identified. No common bile duct dilatation. Pancreas: No mass, inflammation or ductal dilatation. Moderate pancreatic  atrophy. Spleen:  Upper limits of normal in size.  No focal lesions. Adrenals/Urinary Tract: The adrenal glands and kidneys are unremarkable. Stomach/Bowel: Visualized portions within the abdomen are unremarkable. Vascular/Lymphatic: The aorta and branch vessels are patent. The major venous structures are patent. Small scattered mesenteric and retroperitoneal lymph nodes but no mass or overt adenopathy. Other: Large volume abdominal ascites. No obvious omental or peritoneal surface lesions. Musculoskeletal: No significant bony findings. IMPRESSION: 1. Advanced cirrhotic changes involving the liver as detailed above. There is portal venous hypertension, portal venous collaterals and paraesophageal varices. 2. No worrisome early arterial phase enhancing liver lesions to suggest hepatoma or dysplastic nodules. 3. Large volume abdominal ascites. 4. Left pleural effusion with overlying atelectasis. Electronically Signed   By: Marijo Sanes M.D.   On: 12/04/2018 14:21        Scheduled Meds:  aspirin EC  81 mg Oral QPM   atorvastatin  10 mg Oral q1800   Chlorhexidine Gluconate Cloth  6 each Topical Daily   diltiazem  120 mg Oral Daily   docusate sodium  100 mg Oral Daily   feeding supplement (ENSURE ENLIVE)  237 mL Oral Q1400   lactulose  10 g Oral TID   levothyroxine  75 mcg Oral QAC breakfast   multivitamin with minerals  1 tablet Oral Daily   nystatin   Topical TID   pantoprazole  40 mg Oral Daily   rifaximin  550 mg Oral BID   sodium chloride flush  3 mL Intravenous Once   sodium chloride flush  3 mL Intravenous Q12H   Continuous Infusions:  albumin human 25 g (12/06/18 0954)     LOS: 3 days    Time spent: 30 minutes    Gaudencio Chesnut Darleen Crocker, DO [facility] Pager [phone]  If 7PM-7AM, please contact night-coverage [email or web address] Password [email or web address] 12/06/2018, 11:00 AM

## 2018-12-07 ENCOUNTER — Inpatient Hospital Stay (HOSPITAL_COMMUNITY): Payer: PPO

## 2018-12-07 DIAGNOSIS — R188 Other ascites: Secondary | ICD-10-CM

## 2018-12-07 LAB — CBC
HCT: 25 % — ABNORMAL LOW (ref 36.0–46.0)
Hemoglobin: 8.3 g/dL — ABNORMAL LOW (ref 12.0–15.0)
MCH: 32 pg (ref 26.0–34.0)
MCHC: 33.2 g/dL (ref 30.0–36.0)
MCV: 96.5 fL (ref 80.0–100.0)
Platelets: 128 10*3/uL — ABNORMAL LOW (ref 150–400)
RBC: 2.59 MIL/uL — ABNORMAL LOW (ref 3.87–5.11)
RDW: 16.1 % — ABNORMAL HIGH (ref 11.5–15.5)
WBC: 9.6 10*3/uL (ref 4.0–10.5)
nRBC: 0 % (ref 0.0–0.2)

## 2018-12-07 LAB — BODY FLUID CELL COUNT WITH DIFFERENTIAL
Eos, Fluid: 0 %
Lymphs, Fluid: 55 %
Monocyte-Macrophage-Serous Fluid: 44 % — ABNORMAL LOW (ref 50–90)
Neutrophil Count, Fluid: 1 % (ref 0–25)
Other Cells, Fluid: 1 %
Total Nucleated Cell Count, Fluid: 283 cu mm (ref 0–1000)

## 2018-12-07 LAB — COMPREHENSIVE METABOLIC PANEL
ALT: 24 U/L (ref 0–44)
AST: 45 U/L — ABNORMAL HIGH (ref 15–41)
Albumin: 3.1 g/dL — ABNORMAL LOW (ref 3.5–5.0)
Alkaline Phosphatase: 64 U/L (ref 38–126)
Anion gap: 10 (ref 5–15)
BUN: 35 mg/dL — ABNORMAL HIGH (ref 8–23)
CO2: 21 mmol/L — ABNORMAL LOW (ref 22–32)
Calcium: 8.5 mg/dL — ABNORMAL LOW (ref 8.9–10.3)
Chloride: 97 mmol/L — ABNORMAL LOW (ref 98–111)
Creatinine, Ser: 1.39 mg/dL — ABNORMAL HIGH (ref 0.44–1.00)
GFR calc Af Amer: 43 mL/min — ABNORMAL LOW (ref >60)
GFR calc non Af Amer: 37 mL/min — ABNORMAL LOW (ref >60)
Glucose, Bld: 124 mg/dL — ABNORMAL HIGH (ref 70–99)
Potassium: 3.6 mmol/L (ref 3.5–5.1)
Sodium: 128 mmol/L — ABNORMAL LOW (ref 135–145)
Total Bilirubin: 3.7 mg/dL — ABNORMAL HIGH (ref 0.3–1.2)
Total Protein: 6.2 g/dL — ABNORMAL LOW (ref 6.5–8.1)

## 2018-12-07 LAB — PROTEIN, PLEURAL OR PERITONEAL FLUID: Total protein, fluid: <3 g/dL

## 2018-12-07 LAB — GLUCOSE, PLEURAL OR PERITONEAL FLUID: Glucose, Fluid: 145 mg/dL

## 2018-12-07 LAB — PROTIME-INR
INR: 2.2 — ABNORMAL HIGH (ref 0.8–1.2)
Prothrombin Time: 24.3 seconds — ABNORMAL HIGH (ref 11.4–15.2)

## 2018-12-07 LAB — GRAM STAIN

## 2018-12-07 LAB — AMMONIA: Ammonia: 44 umol/L — ABNORMAL HIGH (ref 9–35)

## 2018-12-07 MED ORDER — VITAMIN K1 10 MG/ML IJ SOLN
5.0000 mg | Freq: Once | INTRAMUSCULAR | Status: AC
  Administered 2018-12-07: 09:00:00 5 mg via SUBCUTANEOUS
  Filled 2018-12-07: qty 1

## 2018-12-07 NOTE — Care Management Important Message (Signed)
Important Message  Patient Details  Name: Jordan Le MRN: [id number] Date of Birth: Sep 06, 1945   Medicare Important Message Given:  Yes     [name] 12/07/2018, 3:49 PM

## 2018-12-07 NOTE — Progress Notes (Signed)
Subjective: Denies confusion. Decreased appetite. Soft BM this morning per nursing staff. No overt GI bleeding. No abdominal pain. Feels bloated/distended. Paracentesis planned for today. Patient aware.   Objective: Vital signs in last 24 hours: Temp:  [97.8 F (36.6 C)-99.6 F (37.6 C)] 98.7 F (37.1 C) (09/14 0517) Pulse Rate:  [46-123] 88 (09/14 0600) Resp:  [14-33] 18 (09/14 0600) BP: (101-130)/(47-63) 106/48 (09/14 0600) SpO2:  [88 %-99 %] 88 % (09/14 0600) Weight:  [88.4 kg] 88.4 kg (09/14 0517) Last BM Date: 12/07/18 General:   Alert and oriented X 4, sallow-appearing, chronically ill-appearing, very pleasant Abdomen:  Bowel sounds present, distended with ascites, no TTP, sitting up in chair Extremities:  With trace pedal and ankle edema. Neurologic:  Alert and  oriented x4;  Negative asterixis   Intake/Output from previous day: 09/13 0701 - 09/14 0700 In: 186.2 [I.V.:6; IV Piggyback:180.2] Out: 850 [Urine:850] Intake/Output this shift: Total I/O In: 129.6 [IV Piggyback:129.6] Out: -   Lab Results: Recent Labs    12/05/18 0508 12/06/18 0456 12/07/18 0459  WBC 10.0 8.7 9.6  HGB 9.3* 8.9* 8.3*  HCT 28.8* 27.7* 25.0*  PLT 138* 126* 128*   BMET Recent Labs    12/05/18 0508 12/06/18 0456 12/07/18 0459  NA 136 133* 128*  K 4.0 4.0 3.6  CL 104 101 97*  CO2 25 24 21*  GLUCOSE 117* 108* 124*  BUN 29* 30* 35*  CREATININE 1.36* 1.28* 1.39*  CALCIUM 8.4* 8.6* 8.5*   LFT Recent Labs    12/05/18 0508 12/06/18 0456 12/07/18 0459  PROT 5.9* 6.0* 6.2*  ALBUMIN 2.2* 2.8* 3.1*  AST 54* 45* 45*  ALT 27 21 24   ALKPHOS 80 67 64  BILITOT 2.9* 3.7* 3.7*   PT/INR Recent Labs    12/06/18 0456 12/07/18 0459  LABPROT 25.3* 24.3*  INR 2.3* 2.2*      Assessment: 73 year old female with newly diagnosed cirrhosis during prior hospitalization Aug 2020, felt to be secondary to NASH, presenting this admission with hepatic encephalopathy improving clinically  and back to baseline today. MELD Na 28. Decompensation felt to be most likely due to diuretics. Lactulose has been increased as of yesterday, with soft BM this morning. Discussed with nursing staff goal of at least 3 soft BMs per day.   Ascites: abdomen distended but without abdominal pain. No concern for SBP at this time. Antibiotics on admission have been discontinued. Previous paras in August and most frequently 9/2 with 4 liters removed. Agree with [place] para today and fluid analysis. Albumin already received this morning as scheduled BID. Diuretic therapy remains on hold.   Coagulopathy: INR 2.2 on admission. Received Vit K 5 mg subcutaneously on 9/12 and again this morning. Would increase to 10 mg for next dosing; recheck INR tomorrow morning.   Normocytic anemia in setting of chronic disease: no overt GI bleeding. As of note, EGD last month with portal gastropathy and no varices.   Elevated AFP as outpatient: MRI on file this admission with advanced cirrhotic changes and without hepatoma.     Plan: Continue increased dosing of lactulose with goal of at least 3 soft BMs per day Continue Xifaxan BID Repeat INR tomorrow morning [place] para today with fluid analysis Diuretic therapy on hold since admission: per Dr. Oneida Alar' note at time of consultation, consider aldactone 50 mg daily with demadex 20 mg daily as outpatient Referral previously placed for [facility] in [place], [place], appt upcoming as outpatient Hopeful discharge in next 24-48  hours if remains stable   Annitta Needs, PhD, ANP-BC [facility]      LOS: 4 days    12/07/2018, 9:30 AM

## 2018-12-07 NOTE — Progress Notes (Signed)
Physical Therapy Treatment Patient Details Name: Jordan Le MRN: [id number] DOB: 1945-04-16 Today's Date: 12/07/2018    History of Present Illness Jordan Le is a 73 y.o. 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  Her last known normal was approximately 830 yesterday evening.  She was at her doctor's appointment for routine lab work this morning and was told to come to the emergency department on account of her confusion.  She is also noted to be incontinent of stool and usually struggles with severe constipation and uses suppositories on a regular basis.  She sees Dr. Oneida Alar as her gastroenterologist who recently diagnosed her with liver cirrhosis and was also concerned about elevated bilirubin at the end of August.    PT Comments    Patient presents seated in chair, agreeable for therapy and her daughter in room.  Patient demonstrates good return for completing BLE ROM/strengthening exercises while seated in chair, able to ambulate without using RW, but has limited arm swing due to guarding/fear of losing balance, slightly unsteady without loss of balance and limited due to c/o fatigue.  Patient tolerated staying up in chair after therapy.  Patient will benefit from continued physical therapy in hospital and recommended venue below to increase strength, balance, endurance for safe ADLs and gait.    Follow Up Recommendations  Home health PT;Supervision for mobility/OOB;Supervision - Intermittent     Equipment Recommendations  None recommended by PT    Recommendations for Other Services       Precautions / Restrictions  Precautions Precautions: Fall Restrictions Weight Bearing Restrictions: No    Mobility  Bed Mobility               General bed mobility comments: presents seated in chair  Transfers Overall transfer level: Needs assistance Equipment used: None Transfers: Sit to/from Stand;Stand Pivot Transfers Sit to Stand: Supervision Stand pivot transfers: Supervision       General transfer comment: slow slightly labored movement  Ambulation/Gait Ambulation/Gait assistance: Supervision;Min guard Gait Distance (Feet): 100 Feet Assistive device: None Gait Pattern/deviations: Decreased step length - right;Decreased step length - left;Decreased stride length;Wide base of support Gait velocity: decreased   General Gait Details: slightly labored cadence with decreased arm swing, slightly unsteady without loss of balance, limited secondary to c/o fatigue   Stairs             Wheelchair Mobility    Modified Rankin (Stroke Patients Only)       Balance Overall balance assessment: Needs assistance Sitting-balance support: Feet supported;No upper extremity supported Sitting balance-Leahy Scale: Good     Standing balance support: During functional activity;No upper extremity supported Standing balance-Leahy Scale: Fair Standing balance comment: fair/good static, fair dynamic                            Cognition Arousal/Alertness: Awake/alert Behavior During Therapy: WFL for tasks assessed/performed Overall Cognitive Status: Within Functional Limits for tasks assessed                                        Exercises General  Exercises - Lower Extremity Long Arc Quad: Seated;AROM;Strengthening;Both;10 reps Hip Flexion/Marching: Seated;AROM;Strengthening;Both;10 reps Toe Raises: Seated;AROM;Strengthening;Both;10 reps Heel Raises: Seated;AROM;Strengthening;Both;10 reps    General Comments        Pertinent Vitals/Pain Pain Assessment:  Faces Faces Pain Scale: Hurts a little bit Pain Location: stomach Pain Descriptors / Indicators: Aching Pain Intervention(s): Limited activity within patient's tolerance;Monitored during session    Home Living                      Prior Function            PT Goals (current goals can now be found in the care plan section) Acute Rehab PT Goals Patient Stated Goal: return home with family to assist PT Goal Formulation: With patient/family Time For Goal Achievement: 12/09/18 Potential to Achieve Goals: Good Progress towards PT goals: Progressing toward goals    Frequency    Min 3X/week      PT Plan Current plan remains appropriate    Co-evaluation              AM-PAC PT "6 Clicks" Mobility   Outcome Measure  Help needed turning from your back to your side while in a flat bed without using bedrails?: None Help needed moving from lying on your back to sitting on the side of a flat bed without using bedrails?: None Help needed moving to and from a bed to a chair (including a wheelchair)?: None Help needed standing up from a chair using your arms (e.g., wheelchair or bedside chair)?: None Help needed to walk in hospital room?: A Little Help needed climbing 3-5 steps with a railing? : A Little 6 Click Score: 22    End of Session Equipment Utilized During Treatment: Gait belt Activity Tolerance: Patient tolerated treatment well;Patient limited by fatigue Patient left: in chair;with call bell/phone within reach;with family/visitor present Nurse Communication: Mobility status PT Visit Diagnosis: Unsteadiness on feet (R26.81);Other abnormalities of gait and mobility (R26.89);Muscle weakness (generalized) (M62.81)     Time: 6387-5643 PT Time Calculation (min) (ACUTE ONLY): 32 min  Charges:  $Gait Training: 8-22 mins $Therapeutic Exercise: 8-22 mins                     3:20 PM, 12/07/18 Lonell Grandchild, MPT Physical Therapist with [facility] [phone] office [phone] mobile phone

## 2018-12-07 NOTE — Progress Notes (Signed)
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 Her last known normal was approximately 830 yesterday evening. She was at her doctor's appointment for routine lab work this morning and was told to come to the emergency department on account of her confusion. She is also noted to be incontinent of stool and usually struggles with severe constipation and uses suppositories on a regular basis. She sees Dr. Oneida Alar as her gastroenterologist who recently diagnosed her with liver cirrhosis and was also concerned about elevated bilirubin at the end of August.  9/11:Patient appears improved this morning with lower lactic acid levels. Will discontinue vancomycin as MRSA nares is negative. Blood cultures negative thus far. Continue cefepime empirically for now. GI consultation pending. Ammonia levels have decreased and mentation has improved. Continue gentle IV fluid as patient is not quite tolerating diet and avoid diuretics for now.  9/12:Patient appears clinically improved, but maintaining very soft blood pressure readings as well as some tachycardia. INR elevated and GI to administer vitamin K. We will also order albumin infusion after discussion with GI given soft blood pressure readings. May need paracentesis prior to discharge. We will continue cefepime empirically for now. Ammonia levels still elevated and rifaximin started 9/11. MRI abdomen with noted large volume paracentesis, but  no focal abnormalities.  9/13: Patient still continues to have some mild tachycardia, but blood pressure readings have improved with albumin infusion.  She continues to feel well otherwise and is alert and oriented x3.  She has some difficulty with her bowel movements and requires stool softener.  INR still elevated despite vitamin K administration.  9/14: Patient did have bowel movement last night but did not sleep too well.  She is complaining of ongoing abdominal pain overnight.  Her INR still remains elevated.  We will plan to give further vitamin K.  Lactulose was increased yesterday.  Plans are for paracentesis today.  She continues to have soft blood pressure readings and albumin levels are improving with confusion.  Assessment & Plan:   Active Problems:   Acute hepatic encephalopathy   Hepatic encephalopathy (HCC)   Lactic acidosis   Dehydration   Acute hepatic encephalopathy in the setting ofsuspected NASH liver cirrhosis- resolved -Continue lactuloseand Xifaxan as ordered by GI -Continue to monitor ammonia levels in a.m. with steady improvement noted -Appreciate GI evaluation and management -Continue monitor closely with neurochecks  NASHliver cirrhosis with hyperbilirubinemia and ascites/anasarca -No abdominal pain, but some nausea and vomiting noted last night -LFTs and bilirubin levels downtrending -Continue to monitor repeat labs -Appreciate GI evaluation with plans for paracentesis of 4 L of fluid today -Hold diuretics for nowgiven soft blood pressure readings and continue albumin as ordered -INR noted to be 2.3 with vitamin K given on 9/12, will repeat dose of 5 mg vitamin K subcutaneously today -Monitor daily weightsnoted to be 188 poundson admission and 189.6 on 9/12 -Patient noted to be at 212 pounds on 11/02/2018  Hypotension related to ascites and hypoalbuminemia -Improving with albumin infusions -Improvement in albumin level noted this a.m. -Continue to  monitor closely in stepdown  unit for now with paracentesis planned today. -Soft blood pressure readings stabilized at this time  Lactic acidosis- mildly persistent -No further IV fluid at this time -Appears to be related to dehydration -Cefepime discontinued 9/13 with no active source of infection noted  Questionable AKI versus developing CKD stage III-4 related to intravascular volume depletion and ischemic ATN-improved -Creatinine currently noted to be 1.28with prior noted to be 1.73 -Continue to monitor strict I's and O's and avoid nephrotoxic agents -Currently positive fluid balance of 718 with urine output of 650 -Weight stable at 189 pounds -Monitor repeat labs in a.m.  Sinus tachycardia in the setting of SVT/atrial tachycardia-improved -Likely related to dehydration with associated lactic acidosis -Avoid d-dimer at the moment as patient is not hypoxemic -Recent TSH 5.4 and echo with normal EF  Anemia of chronic disease-stable -No overt bleeding identified, likely secondary to hemoconcentration -Repeat CBC in a.m.  Type 2 diabetes with mild hyperglycemia -Recent hemoglobin A1c 4.7% with metformin discontinued -Monitor closely on carb modified diet  Left lower extremity stasis dermatitis -Appears resolved at the moment  Hypothyroidism -Continue levothyroxine with recent TSH 5.4  Dyslipidemia -Continue statin   DVT prophylaxis:SCDs Code Status:Full Family Communication:Discussed with daughteron 9/11 Disposition Plan:Albumin infusion and vitamin K administration per GI recommendations. Monitor a.m. labs. Plans for paracentesis today.  Anticipate discharge in the next 24-48 hours if stable and remains improved with home health services.   Consultants:  GI  Procedures:  Abdominal MRI 9/11  Antimicrobials:  Anti-infectives (From admission, onward)   Start     Dose/Rate Route Frequency Ordered Stop   12/04/18 1815  rifaximin (XIFAXAN)  tablet 550 mg     550 mg Oral 2 times daily 12/04/18 1801     12/04/18 1400  vancomycin (VANCOCIN) IVPB 1000 mg/200 mL premix  Status:  Discontinued     1,000 mg 200 mL/hr over 60 Minutes Intravenous Every 24 hours 12/03/18 1224 12/04/18 0646   12/04/18 1300  ceFEPIme (MAXIPIME) 1 g in sodium chloride 0.9 % 100 mL IVPB  Status:  Discontinued     1 g 200 mL/hr over 30 Minutes Intravenous Every 24 hours 12/03/18 1220 12/06/18 1059   12/03/18 1400  vancomycin (VANCOCIN) IVPB 1000 mg/200 mL premix  Status:  Discontinued     1,000 mg 200 mL/hr over 60 Minutes Intravenous  Once 12/03/18 1222 12/06/18 1059   12/03/18 1300  vancomycin (VANCOCIN) IVPB 1000 mg/200 mL premix     1,000 mg 200 mL/hr over 60 Minutes Intravenous  Once 12/03/18 1222 12/03/18 1542   12/03/18 1215  ceFEPIme (MAXIPIME) 2 g in sodium chloride 0.9 % 100 mL IVPB     2 g 200 mL/hr over 30 Minutes Intravenous  Once 12/03/18 1214 12/03/18 1342   12/03/18 1215  metroNIDAZOLE (FLAGYL) IVPB 500 mg     500 mg 100 mL/hr over 60 Minutes Intravenous  Once 12/03/18 1214 12/03/18 1542   12/03/18 1215  vancomycin (VANCOCIN) IVPB 1000 mg/200 mL premix  Status:  Discontinued     1,000 mg 200 mL/hr over 60 Minutes Intravenous  Once 12/03/18 1214 12/03/18 1222       Subjective: Patient seen and evaluated today with bowel movement noted overnight.  She continues to have soft blood pressure readings.  She has been complaining of diffuse abdominal pain overnight and did not sleep too well.  Objective: Vitals:   12/07/18 0400 12/07/18 0500 12/07/18 0517 12/07/18 0600  BP: (!) 116/52 (!) 114/53  (!) 106/48  Pulse: 88  90  88  Resp: (!) 22 (!) 25  18  Temp:   98.7 F (37.1 C)   TempSrc:   Oral   SpO2: 91% 94%  (!) 88%  Weight:   88.4 kg   Height:   5\' 3"  (1.6 m)     Intake/Output Summary (Last 24 hours) at 12/07/2018 0723 Last data filed at 12/07/2018 0200 Gross per 24 hour  Intake 186.16 ml  Output 850 ml  Net -663.84 ml   Filed  Weights   12/05/18 0433 12/06/18 0625 12/07/18 0517  Weight: 86 kg 87 kg 88.4 kg    Examination:  General exam: Appears calm and comfortable  Respiratory system: Clear to auscultation. Respiratory effort normal. Cardiovascular system: S1 & S2 heard, RRR. No JVD, murmurs, rubs, gallops or clicks. No pedal edema. Gastrointestinal system: Abdomen is distended, beginning to become tense and nontender. No organomegaly or masses felt. Normal bowel sounds heard. Central nervous system: Alert and oriented. No focal neurological deficits. Extremities: Symmetric 5 x 5 power. Skin: No rashes, lesions or ulcers Psychiatry: Judgement and insight appear normal. Mood & affect appropriate.     Data Reviewed: I have personally reviewed following labs and imaging studies  CBC: Recent Labs  Lab 12/03/18 1054 12/04/18 0511 12/05/18 0508 12/06/18 0456 12/07/18 0459  WBC 14.3* 11.0* 10.0 8.7 9.6  HGB 11.1* 9.1* 9.3* 8.9* 8.3*  HCT 34.2* 28.0* 28.8* 27.7* 25.0*  MCV 96.6 97.6 99.0 98.9 96.5  PLT 201 143* 138* 126* 272*   Basic Metabolic Panel: Recent Labs  Lab 12/03/18 1054 12/04/18 0511 12/05/18 0508 12/06/18 0456 12/07/18 0459  NA 131* 136 136 133* 128*  K 4.0 3.5 4.0 4.0 3.6  CL 94* 104 104 101 97*  CO2 25 24 25 24  21*  GLUCOSE 160* 95 117* 108* 124*  BUN 35* 29* 29* 30* 35*  CREATININE 1.73* 1.28* 1.36* 1.28* 1.39*  CALCIUM 8.8* 8.3* 8.4* 8.6* 8.5*  MG  --  2.1  --   --   --    GFR: Estimated Creatinine Clearance: 37.4 mL/min (A) (by C-G formula based on SCr of 1.39 mg/dL (H)). Liver Function Tests: Recent Labs  Lab 12/03/18 1054 12/04/18 0511 12/05/18 0508 12/06/18 0456 12/07/18 0459  AST 77* 52* 54* 45* 45*  ALT 37 26 27 21 24   ALKPHOS 84 61 80 67 64  BILITOT 4.6* 3.5* 2.9* 3.7* 3.7*  PROT 7.3 5.8* 5.9* 6.0* 6.2*  ALBUMIN 3.0* 2.2* 2.2* 2.8* 3.1*   Recent Labs  Lab 12/03/18 1054  LIPASE 39   Recent Labs  Lab 12/03/18 1055 12/04/18 0511 12/05/18 0508  12/06/18 0455 12/07/18 0459  AMMONIA 95* 64* 87* 45* 44*   Coagulation Profile: Recent Labs  Lab 12/04/18 0511 12/05/18 0508 12/06/18 0456 12/07/18 0459  INR 2.2* 2.1* 2.3* 2.2*   Cardiac Enzymes: No results for input(s): CKTOTAL, CKMB, CKMBINDEX, TROPONINI in the last 168 hours. BNP (last 3 results) No results for input(s): PROBNP in the last 8760 hours. HbA1C: No results for input(s): HGBA1C in the last 72 hours. CBG: Recent Labs  Lab 12/03/18 1108  GLUCAP 157*   Lipid Profile: No results for input(s): CHOL, HDL, LDLCALC, TRIG, CHOLHDL, LDLDIRECT in the last 72 hours. Thyroid Function Tests: No results for input(s): TSH, T4TOTAL, FREET4, T3FREE, THYROIDAB in the last 72 hours. Anemia Panel: No results for input(s): VITAMINB12, FOLATE, FERRITIN, TIBC, IRON, RETICCTPCT in the last 72 hours. Sepsis Labs: Recent Labs  Lab 12/03/18 1249 12/04/18 0511 12/05/18 5366  12/06/18 0455  LATICACIDVEN 2.8* 1.3 1.3 1.5    Recent Results (from the past 240 hour(s))  Culture, blood (routine x 2)     Status: None (Preliminary result)   Collection Time: 12/03/18 10:54 AM   Specimen: BLOOD LEFT ARM  Result Value Ref Range Status   Specimen Description BLOOD LEFT ARM DRAWN BY RN  Final   Special Requests   Final    BOTTLES DRAWN AEROBIC AND ANAEROBIC Blood Culture results may not be optimal due to an excessive volume of blood received in culture bottles   Culture   Final    NO GROWTH 3 DAYS Performed at [facility], [address], [place], [place] [address]    Report Status PENDING  Incomplete  SARS Coronavirus 2 [facility] order, Performed in [place] hospital lab) Nasopharyngeal Nasopharyngeal Swab     Status: None   Collection Time: 12/03/18 11:32 AM   Specimen: Nasopharyngeal Swab  Result Value Ref Range Status   SARS Coronavirus 2 NEGATIVE NEGATIVE Final    Comment: (NOTE) If result is NEGATIVE SARS-CoV-2 target nucleic acids are NOT DETECTED. The SARS-CoV-2 RNA is  generally detectable in upper and lower  respiratory specimens during the acute phase of infection. The lowest  concentration of SARS-CoV-2 viral copies this assay can detect is 250  copies / mL. A negative result does not preclude SARS-CoV-2 infection  and should not be used as the sole basis for treatment or other  patient management decisions.  A negative result may occur with  improper specimen collection / handling, submission of specimen other  than nasopharyngeal swab, presence of viral mutation(s) within the  areas targeted by this assay, and inadequate number of viral copies  (<250 copies / mL). A negative result must be combined with clinical  observations, patient history, and epidemiological information. If result is POSITIVE SARS-CoV-2 target nucleic acids are DETECTED. The SARS-CoV-2 RNA is generally detectable in upper and lower  respiratory specimens dur ing the acute phase of infection.  Positive  results are indicative of active infection with SARS-CoV-2.  Clinical  correlation with patient history and other diagnostic information is  necessary to determine patient infection status.  Positive results do  not rule out bacterial infection or co-infection with other viruses. If result is PRESUMPTIVE POSTIVE SARS-CoV-2 nucleic acids MAY BE PRESENT.   A presumptive positive result was obtained on the submitted specimen  and confirmed on repeat testing.  While 2019 novel coronavirus  (SARS-CoV-2) nucleic acids may be present in the submitted sample  additional confirmatory testing may be necessary for epidemiological  and / or clinical management purposes  to differentiate between  SARS-CoV-2 and other Sarbecovirus currently known to infect humans.  If clinically indicated additional testing with an alternate test  methodology [phone]) is advised. The SARS-CoV-2 RNA is generally  detectable in upper and lower respiratory sp ecimens during the acute  phase of infection.  The expected result is Negative. Fact Sheet for Patients:  StrictlyIdeas.no Fact Sheet for Healthcare Providers: BankingDealers.co.za This test is not yet approved or cleared by the [place] FDA and has been authorized for detection and/or diagnosis of SARS-CoV-2 by FDA under an Emergency Use Authorization (EUA).  This EUA will remain in effect (meaning this test can be used) for the duration of the COVID-19 declaration under Section 564(b)(1) of the Act, 21 U.S.C. section 360bbb-3(b)(1), unless the authorization is terminated or revoked sooner. Performed at [facility], [address], [place], [place] [address]  Culture, blood (routine x 2)     Status: None (Preliminary result)   Collection Time: 12/03/18 12:49 PM   Specimen: BLOOD RIGHT HAND  Result Value Ref Range Status   Specimen Description BLOOD RIGHT HAND  Final   Special Requests   Final    BOTTLES DRAWN AEROBIC AND ANAEROBIC Blood Culture adequate volume   Culture   Final    NO GROWTH 3 DAYS Performed at [facility], [address] [place], [place] [address]    Report Status PENDING  Incomplete  MRSA PCR Screening     Status: None   Collection Time: 12/03/18  4:25 PM   Specimen: Nasopharyngeal  Result Value Ref Range Status   MRSA by PCR NEGATIVE NEGATIVE Final    Comment:        The GeneXpert MRSA Assay (FDA approved for NASAL specimens only), is one component of a comprehensive MRSA colonization surveillance program. It is not intended to diagnose MRSA infection nor to guide or monitor treatment for MRSA infections. Performed at [facility], [address] [place], [place] [address]          Radiology Studies: No results found.      Scheduled Meds: . aspirin EC  81 mg Oral QPM  . atorvastatin  10 mg Oral q1800  . Chlorhexidine Gluconate Cloth  6 each Topical Daily  . diltiazem  120 mg Oral Daily  . docusate sodium  100 mg Oral Daily  .  feeding supplement (ENSURE ENLIVE)  237 mL Oral Q1400  . lactulose  20 g Oral TID  . levothyroxine  75 mcg Oral QAC breakfast  . multivitamin with minerals  1 tablet Oral Daily  . nystatin   Topical TID  . pantoprazole  40 mg Oral Daily  . rifaximin  550 mg Oral BID  . sodium chloride flush  3 mL Intravenous Once  . sodium chloride flush  3 mL Intravenous Q12H   Continuous Infusions: . albumin human 25 g (12/06/18 2103)     LOS: 4 days    Time spent: 30 minutes    Wyndham Santilli Darleen Crocker, DO [facility] Pager [phone]  If 7PM-7AM, please contact night-coverage [email or web address] Password [facility] 12/07/2018, 7:23 AM

## 2018-12-08 ENCOUNTER — Ambulatory Visit (HOSPITAL_COMMUNITY): Payer: PPO

## 2018-12-08 LAB — COMPREHENSIVE METABOLIC PANEL
ALT: 18 U/L (ref 0–44)
AST: 34 U/L (ref 15–41)
Albumin: 3.2 g/dL — ABNORMAL LOW (ref 3.5–5.0)
Alkaline Phosphatase: 57 U/L (ref 38–126)
Anion gap: 8 (ref 5–15)
BUN: 37 mg/dL — ABNORMAL HIGH (ref 8–23)
CO2: 23 mmol/L (ref 22–32)
Calcium: 8.7 mg/dL — ABNORMAL LOW (ref 8.9–10.3)
Chloride: 99 mmol/L (ref 98–111)
Creatinine, Ser: 1.22 mg/dL — ABNORMAL HIGH (ref 0.44–1.00)
GFR calc Af Amer: 51 mL/min — ABNORMAL LOW (ref >60)
GFR calc non Af Amer: 44 mL/min — ABNORMAL LOW (ref >60)
Glucose, Bld: 115 mg/dL — ABNORMAL HIGH (ref 70–99)
Potassium: 3.8 mmol/L (ref 3.5–5.1)
Sodium: 130 mmol/L — ABNORMAL LOW (ref 135–145)
Total Bilirubin: 3.7 mg/dL — ABNORMAL HIGH (ref 0.3–1.2)
Total Protein: 5.8 g/dL — ABNORMAL LOW (ref 6.5–8.1)

## 2018-12-08 LAB — CULTURE, BLOOD (ROUTINE X 2)
Culture: NO GROWTH
Culture: NO GROWTH
Special Requests: ADEQUATE

## 2018-12-08 LAB — CBC
HCT: 25.7 % — ABNORMAL LOW (ref 36.0–46.0)
Hemoglobin: 8.6 g/dL — ABNORMAL LOW (ref 12.0–15.0)
MCH: 32.5 pg (ref 26.0–34.0)
MCHC: 33.5 g/dL (ref 30.0–36.0)
MCV: 97 fL (ref 80.0–100.0)
Platelets: 126 10*3/uL — ABNORMAL LOW (ref 150–400)
RBC: 2.65 MIL/uL — ABNORMAL LOW (ref 3.87–5.11)
RDW: 16 % — ABNORMAL HIGH (ref 11.5–15.5)
WBC: 10.4 10*3/uL (ref 4.0–10.5)
nRBC: 0 % (ref 0.0–0.2)

## 2018-12-08 LAB — PROTIME-INR
INR: 2.2 — ABNORMAL HIGH (ref 0.8–1.2)
Prothrombin Time: 23.8 seconds — ABNORMAL HIGH (ref 11.4–15.2)

## 2018-12-08 LAB — LACTIC ACID, PLASMA: Lactic Acid, Venous: 1.2 mmol/L (ref 0.5–1.9)

## 2018-12-08 LAB — AMMONIA: Ammonia: 44 umol/L — ABNORMAL HIGH (ref 9–35)

## 2018-12-08 MED ORDER — LACTULOSE 10 GM/15ML PO SOLN: 20.0000 g | Freq: Three times a day (TID) | ORAL | 0 refills | Status: DC

## 2018-12-08 MED ORDER — DOCUSATE SODIUM 100 MG PO CAPS: 100.0000 mg | ORAL_CAPSULE | Freq: Every day | ORAL | 0 refills | Status: DC

## 2018-12-08 MED ORDER — VITAMIN K1 10 MG/ML IJ SOLN
10.0000 mg | Freq: Once | INTRAMUSCULAR | Status: AC
  Administered 2018-12-08: 08:00:00 10 mg via SUBCUTANEOUS
  Filled 2018-12-08: qty 1

## 2018-12-08 MED ORDER — SPIRONOLACTONE 50 MG PO TABS: 50.0000 mg | ORAL_TABLET | Freq: Every day | ORAL | 3 refills | Status: DC

## 2018-12-08 MED ORDER — RIFAXIMIN 550 MG PO TABS: 550.0000 mg | ORAL_TABLET | Freq: Two times a day (BID) | ORAL | 3 refills | Status: DC

## 2018-12-08 MED ORDER — TORSEMIDE 20 MG PO TABS: 20.0000 mg | ORAL_TABLET | Freq: Every day | ORAL | 3 refills | Status: DC

## 2018-12-08 NOTE — Discharge Summary (Addendum)
Physician Discharge Summary  Jordan Le YDX:[id number] DOB: Jan 18, 1946 DOA: 12/03/2018  PCP: Manon Hilding, MD  Admit date: 12/03/2018  Discharge date: 12/08/2018  Admitted From:Home  Disposition:  Home  Recommendations for Outpatient Follow-up:  1. Follow up with PCP in 1-2 weeks 2. Follow-up with liver clinic on 9/22 as scheduled with need to follow-up INR levels as well as other lab work is indicated 3. Patient given dose of 10 mg [place] vitamin K prior to discharge due to persistently elevated INR level as noted below 4. Continue on Aldactone 50 mg daily as well as Demadex 20 mg daily as recommended by gastroenterology 5. Continue on rifaximin and lactulose as prescribed with ammonia level 44 on day of discharge, no further neurological symptoms noted.  Home Health: Yes with PT  Equipment/Devices: None  Discharge Condition: Stable  CODE STATUS: Full  Diet recommendation: Heart Healthy/carb modified  Brief/Interim Summary: Per HPI: Jordan Danker Perkinsis a 73 y.o.femalewith medical history significant fortype 2 diabetes, dyslipidemia, prior TIAs, prior endometrial carcinoma with hysterectomy,hypertension, SVT/atrial tachycardia, hypothyroidism, GERD, anemia of chronic disease, and recently diagnosed NASHliver cirrhosis with esophageal varices, who presented to the ED after she was noted to be quite confused and disoriented by her daughter this morning. Her last known normal was approximately 830 yesterday evening. She was at her doctor's appointment for routine lab work this morning and was told to come to the emergency department on account of her confusion. She is also noted to be incontinent of stool and usually struggles with severe constipation and uses suppositories on a regular basis. She sees Dr. Oneida Alar as her gastroenterologist who recently diagnosed her with liver cirrhosis and was also concerned about elevated bilirubin at the end of August.  9/11:Patient appears  improved this morning with lower lactic acid levels. Will discontinue vancomycin as MRSA nares is negative. Blood cultures negative thus far. Continue cefepime empirically for now. GI consultation pending. Ammonia levels have decreased and mentation has improved. Continue gentle IV fluid as patient is not quite tolerating diet and avoid diuretics for now.  9/12:Patient appears clinically improved, but maintaining very soft blood pressure readings as well as some tachycardia. INR elevated and GI to administer vitamin K. We will also order albumin infusion after discussion with GI given soft blood pressure readings. May need paracentesis prior to discharge. We will continue cefepime empirically for now. Ammonia levels still elevated and rifaximin started 9/11. MRI abdomen with noted large volume paracentesis, but no focal abnormalities.  9/13: Patient still continues to have some mild tachycardia, but blood pressure readings have improved with albumin infusion. She continues to feel well otherwise and is alert and oriented x3. She has some difficulty with her bowel movements and requires stool softener. INR still elevated despite vitamin K administration.  9/14: Patient did have bowel movement last night but did not sleep too well.  She is complaining of ongoing abdominal pain overnight.  Her INR still remains elevated.  We will plan to give further vitamin K.  Lactulose was increased yesterday.  Plans are for paracentesis today.  She continues to have soft blood pressure readings and albumin levels are improving with confusion.  9/15: Patient has undergone large-volume paracentesis on 9/14 with 4 L of fluid removed and tolerated the procedure well with stable blood pressure readings as well as heart rate.  She denies any further abdominal pain and has had no confusion throughout the course of this admission.  She appears stable to start receiving home diuretic  therapy as outlined above.   She remains on lactulose and rifaximin at home.  She will be set up with home health physical therapy as well.  Ascitic fluid with no significant signs of infection noted or organisms on Gram stain.  She is stable for discharge to follow-up with liver clinic in [place] as scheduled.  She will receive 1 more dose of vitamin K 10 mg subcutaneously prior to discharge as INR continues to remain somewhat elevated.  Will have close follow-up with GI in the outpatient setting.  She has had no other acute events during the course of this admission and is stable for discharge today.  Patient overall does have poor prognosis with MELD Na score of 28.  Palliative care was consulted during this admission, but did not have a chance to see this patient prior to discharge.  Would likely consider evaluation for hospice care in the near future given frailty and poor prognosis long-term.  Discharge Diagnoses:  Active Problems:   Acute hepatic encephalopathy   Hepatic encephalopathy (HCC)   Lactic acidosis   Dehydration   Ascites  Principal discharge diagnosis: Acute hepatic encephalopathy in the setting of suspected [place] liver cirrhosis with associated ascites.  Discharge Instructions  Discharge Instructions    Diet - low sodium heart healthy   Complete by: As directed    Increase activity slowly   Complete by: As directed      Allergies as of 12/08/2018   No Known Allergies     Medication List    TAKE these medications   atorvastatin 10 MG tablet Commonly known as: LIPITOR Take 1 tablet (10 mg total) by mouth daily at 6 PM.   diltiazem 120 MG 24 hr capsule Commonly known as: Cardizem CD Take 1 capsule (120 mg total) by mouth daily.   docusate sodium 100 MG capsule Commonly known as: COLACE Take 1 capsule (100 mg total) by mouth daily.   feeding supplement (ENSURE ENLIVE) Liqd Take 237 mLs by mouth 2 (two) times daily between meals. What changed: when to take this   glycerin adult 2 g  suppository Place 1 suppository rectally as needed for constipation.   lactulose 10 GM/15ML solution Commonly known as: CHRONULAC Take 30 mLs (20 g total) by mouth 3 (three) times daily.   levothyroxine 75 MCG tablet Commonly known as: SYNTHROID Take 75 mcg by mouth daily before breakfast.   multivitamin with minerals Tabs tablet Take 1 tablet by mouth daily.   nystatin powder Commonly known as: MYCOSTATIN/NYSTOP Apply topically 3 (three) times daily. What changed:   how much to take  when to take this   omeprazole 20 MG capsule Commonly known as: PRILOSEC 1 PO 30 MINS PRIOR TO BREAKFAST. What changed:   how much to take  how to take this  when to take this   Potassium 99 MG Tabs Take 1 tablet by mouth daily.   rifaximin 550 MG Tabs tablet Commonly known as: XIFAXAN Take 1 tablet (550 mg total) by mouth 2 (two) times daily.   spironolactone 50 MG tablet Commonly known as: Aldactone Take 1 tablet (50 mg total) by mouth daily. What changed:   medication strength  how much to take   torsemide 20 MG tablet Commonly known as: DEMADEX Take 1 tablet (20 mg total) by mouth daily. What changed:   how much to take  when to take this      [place], [facility] Follow up.   Specialty:  Home Health Services Why: Home Health for aide/SW/PT Contact information: [address] [address] [phone]        Manon Hilding, MD Follow up in 1 week(s).   Specialty: Family Medicine Contact information: [address] [place] [address] [phone]          No Known Allergies  Consultations:  GI   Procedures/Studies: Ct Head Wo Contrast  Result Date: 12/03/2018 CLINICAL DATA:  Altered level of consciousness. EXAM: CT HEAD WITHOUT CONTRAST TECHNIQUE: Contiguous axial images were obtained from the base of the skull through the vertex without intravenous contrast. COMPARISON:  None. FINDINGS: Brain: No evidence  of acute infarction, hemorrhage, hydrocephalus, extra-axial collection or mass lesion/mass effect. Mild brain parenchymal volume loss and deep white matter microangiopathy. Vascular: Calcific atherosclerotic disease of the intra cavernous carotid arteries, mild. Skull: Normal. Negative for fracture or focal lesion. Sinuses/Orbits: No acute finding. Other: None. IMPRESSION: No acute intracranial abnormality. Electronically Signed   By: Fidela Salisbury M.D.   On: 12/03/2018 14:11   Mr Abdomen W Wo Contrast  Result Date: 12/04/2018 CLINICAL DATA:  Cirrhosis and ascites. EXAM: MRI ABDOMEN WITHOUT AND WITH CONTRAST TECHNIQUE: Multiplanar multisequence MR imaging of the abdomen was performed both before and after the administration of intravenous contrast. CONTRAST:  64mL GADAVIST GADOBUTROL 1 MMOL/ML IV SOLN COMPARISON:  CT scan 10/25/2018 FINDINGS: Lower chest: There is a small left pleural effusion and overlying left lower lobe atelectasis. No pericardial effusion. Hepatobiliary: Advanced cirrhotic changes involving the liver. The liver is small and demonstrates marked cortical irregularity with dilated hepatic fissures and increased caudate to right lobe ratio. There is portal venous hypertension with portal venous collaterals and paraesophageal varices. The portal and hepatic veins are patent. No worrisome early arterial phase enhancing lesions to suggest hepatoma or dysplastic nodules. The gallbladder is mildly distended. No gallstones are identified. No common bile duct dilatation. Pancreas: No mass, inflammation or ductal dilatation. Moderate pancreatic atrophy. Spleen:  Upper limits of normal in size.  No focal lesions. Adrenals/Urinary Tract: The adrenal glands and kidneys are unremarkable. Stomach/Bowel: Visualized portions within the abdomen are unremarkable. Vascular/Lymphatic: The aorta and branch vessels are patent. The major venous structures are patent. Small scattered mesenteric and  retroperitoneal lymph nodes but no mass or overt adenopathy. Other: Large volume abdominal ascites. No obvious omental or peritoneal surface lesions. Musculoskeletal: No significant bony findings. IMPRESSION: 1. Advanced cirrhotic changes involving the liver as detailed above. There is portal venous hypertension, portal venous collaterals and paraesophageal varices. 2. No worrisome early arterial phase enhancing liver lesions to suggest hepatoma or dysplastic nodules. 3. Large volume abdominal ascites. 4. Left pleural effusion with overlying atelectasis. Electronically Signed   By: Marijo Sanes M.D.   On: 12/04/2018 14:21   US Paracentesis  Result Date: 12/07/2018 INDICATION: Cirrhosis and ascites. EXAM: ULTRASOUND GUIDED RIGHT ABDOMINAL PARACENTESIS MEDICATIONS: None. COMPLICATIONS: None immediate. PROCEDURE: Informed written consent was obtained from the patient after a discussion of the risks, benefits and alternatives to treatment. A timeout was performed prior to the initiation of the procedure. Initial ultrasound scanning demonstrates a large amount of ascites within the right lower abdominal quadrant. The right lower abdomen was prepped and draped in the usual sterile fashion. 1% lidocaine with epinephrine was used for local anesthesia. Following this, a 19 gauge, 7-cm, Yueh catheter was introduced. An ultrasound image was saved for documentation purposes. The paracentesis was performed. The catheter was removed and a dressing was applied. The patient tolerated  the procedure well without immediate post procedural complication. FINDINGS: A total of approximately 4.0 L of serous fluid was removed. Samples were sent to the laboratory as requested by the clinical team. IMPRESSION: Successful ultrasound-guided paracentesis yielding 4.0 L liters of peritoneal fluid. Electronically Signed   By: Abigail Miyamoto M.D.   On: 12/07/2018 13:04   US Paracentesis  Result Date: 11/25/2018 INDICATION: Cirrhosis, ascites  EXAM: ULTRASOUND GUIDED THERAPEUTIC PARACENTESIS MEDICATIONS: None. COMPLICATIONS: None immediate. PROCEDURE: Informed written consent was obtained from the patient after a discussion of the risks, benefits and alternatives to treatment. A timeout was performed prior to the initiation of the procedure. Initial ultrasound scanning demonstrates a large amount of ascites within the right lower abdominal quadrant. The right lower abdomen was prepped and draped in the usual sterile fashion. 1% lidocaine with epinephrine was used for local anesthesia. Following this, a 5 [place] Yueh catheter was introduced. An ultrasound image was saved for documentation purposes. The paracentesis was performed. The catheter was removed and a dressing was applied. The patient tolerated the procedure well without immediate post procedural complication. Removal of fluid was stopped at 4 L due to persistent tachycardia throughout exam and a decrese in diastolic blood pressure from the 70s to the mid 50s during the 4 L of fluid removal, though systolic pressure remained at 116. Patient remained asymptomatic. FINDINGS: A total of approximately 4 L of yellow ascitic fluid was removed. IMPRESSION: Successful ultrasound-guided paracentesis yielding 4 liters of peritoneal fluid. Electronically Signed   By: Lavonia Dana M.D.   On: 11/25/2018 15:34   Dg Chest Portable 1 View  Result Date: 12/03/2018 CLINICAL DATA:  Acute change in mental status. EXAM: PORTABLE CHEST 1 VIEW COMPARISON:  October 25, 2018 FINDINGS: Cardiomediastinal silhouette is normal. Mediastinal contours appear intact. Calcific atherosclerotic disease of the aorta. There is no evidence of focal airspace consolidation, pleural effusion or pneumothorax. Low lung volumes. Osseous structures are without acute abnormality. Soft tissues are grossly normal. IMPRESSION: 1. No active disease. 2. Low lung volumes. Electronically Signed   By: Fidela Salisbury M.D.   On: 12/03/2018 12:11    US Abdomen Complete W/elastography  Result Date: 11/20/2018 CLINICAL DATA:  Cirrhosis with ascites. EXAM: ULTRASOUND ABDOMEN ULTRASOUND HEPATIC ELASTOGRAPHY TECHNIQUE: Sonography of the upper abdomen was performed. In addition, ultrasound elastography evaluation of the liver was performed. A region of interest was placed within the right lobe of the liver. Following application of a compressive sonographic pulse, shear waves were detected in the adjacent hepatic tissue and the shear wave velocity was calculated. Multiple assessments were performed at the selected site. Median shear wave velocity is correlated to a Metavir fibrosis score. COMPARISON:  CT on 10/25/2018 FINDINGS: ULTRASOUND ABDOMEN Gallbladder: Mild gallbladder sludge and a few tiny gallstones are again seen. Mild diffuse gallbladder wall thickening, likely secondary to cirrhosis. No sonographic Murphy sign noted by sonographer. Common bile duct: Diameter: 4 mm, within normal limits Liver: Diffuse coarsening of parenchymal echotexture and capsular nodularity, consistent with advanced cirrhosis. No focal liver mass visualized. Portal vein is patent on color Doppler imaging with normal direction of blood flow towards the liver. IVC: No abnormality visualized. Pancreas: Visualized portion unremarkable. Spleen: Size and appearance within normal limits. Right Kidney: Length: 11.1 cm. Echogenicity within normal limits. Tiny 1 cm subcapsular cyst in midpole. No mass or hydronephrosis visualized. Left Kidney: Length: 11.2 cm. Echogenicity within normal limits. No mass or hydronephrosis visualized. Abdominal aorta: No aneurysm visualized. Other findings: Moderate ascites. ULTRASOUND HEPATIC ELASTOGRAPHY  Device: Siemens Helix VTQ Patient position: Left Lateral Decubitus Transducer 6C1 Number of measurements: 10 Hepatic segment:  8 Median velocity:   3.61 m/sec IQR: 0.34 IQR/Median velocity ratio: 0.09 Corresponding Metavir fibrosis score:  Some F3 + F4  Risk of fibrosis: High Limitations of exam: Ascites present Please note that abnormal shear wave velocities may also be identified in clinical settings other than with hepatic fibrosis, such as: acute hepatitis, elevated right heart and central venous pressures including use of beta blockers, veno-occlusive disease (Budd-Chiari), infiltrative processes such as mastocytosis/amyloidosis/infiltrative tumor, extrahepatic cholestasis, in the post-prandial state, and liver transplantation. Correlation with patient history, laboratory data, and clinical condition recommended. IMPRESSION: ULTRASOUND ABDOMEN: Hepatic cirrhosis and moderate ascites. No evidence of hepatic neoplasm. Cholelithiasis. Mild diffuse gallbladder wall thickening is nonspecific, but likely secondary to cirrhosis. ULTRASOUND HEPATIC ELASTOGRAPHY: Median hepatic shear wave velocity is calculated at 3.61 m/sec. Corresponding Metavir fibrosis score is Some F3 + F4. Risk of fibrosis is High. Follow-up: Follow up advised Electronically Signed   By: Marlaine Hind M.D.   On: 11/20/2018 12:11     Discharge Exam: Vitals:   12/08/18 0300 12/08/18 0400  BP: (!) 117/52   Pulse: 93   Resp: (!) 21   Temp:  98.9 F (37.2 C)  SpO2: 96%    Vitals:   12/08/18 0200 12/08/18 0300 12/08/18 0400 12/08/18 0500  BP: (!) 118/50 (!) 117/52    Pulse: 95 93    Resp: (!) 24 (!) 21    Temp:   98.9 F (37.2 C)   TempSrc:   Oral   SpO2: 97% 96%    Weight:    84.4 kg  Height:        General: Pt is alert, awake, not in acute distress Cardiovascular: RRR, S1/S2 +, no rubs, no gallops Respiratory: CTA bilaterally, no wheezing, no rhonchi Abdominal: Soft, NT, minimally distended, bowel sounds + Extremities: no edema, no cyanosis    The results of significant diagnostics from this hospitalization (including imaging, microbiology, ancillary and laboratory) are listed below for reference.     Microbiology: Recent Results (from the past 240 hour(s))   Culture, blood (routine x 2)     Status: None (Preliminary result)   Collection Time: 12/03/18 10:54 AM   Specimen: BLOOD LEFT ARM  Result Value Ref Range Status   Specimen Description BLOOD LEFT ARM DRAWN BY RN  Final   Special Requests   Final    BOTTLES DRAWN AEROBIC AND ANAEROBIC Blood Culture results may not be optimal due to an excessive volume of blood received in culture bottles   Culture   Final    NO GROWTH 4 DAYS Performed at [facility], [address] [place], [place] [address]    Report Status PENDING  Incomplete  SARS Coronavirus 2 [facility] order, Performed in [place] hospital lab) Nasopharyngeal Nasopharyngeal Swab     Status: None   Collection Time: 12/03/18 11:32 AM   Specimen: Nasopharyngeal Swab  Result Value Ref Range Status   SARS Coronavirus 2 NEGATIVE NEGATIVE Final    Comment: (NOTE) If result is NEGATIVE SARS-CoV-2 target nucleic acids are NOT DETECTED. The SARS-CoV-2 RNA is generally detectable in upper and lower  respiratory specimens during the acute phase of infection. The lowest  concentration of SARS-CoV-2 viral copies this assay can detect is 250  copies / mL. A negative result does not preclude SARS-CoV-2 infection  and should not be used as the sole basis for treatment or other  patient  management decisions.  A negative result may occur with  improper specimen collection / handling, submission of specimen other  than nasopharyngeal swab, presence of viral mutation(s) within the  areas targeted by this assay, and inadequate number of viral copies  (<250 copies / mL). A negative result must be combined with clinical  observations, patient history, and epidemiological information. If result is POSITIVE SARS-CoV-2 target nucleic acids are DETECTED. The SARS-CoV-2 RNA is generally detectable in upper and lower  respiratory specimens dur ing the acute phase of infection.  Positive  results are indicative of active infection with SARS-CoV-2.   Clinical  correlation with patient history and other diagnostic information is  necessary to determine patient infection status.  Positive results do  not rule out bacterial infection or co-infection with other viruses. If result is PRESUMPTIVE POSTIVE SARS-CoV-2 nucleic acids MAY BE PRESENT.   A presumptive positive result was obtained on the submitted specimen  and confirmed on repeat testing.  While 2019 novel coronavirus  (SARS-CoV-2) nucleic acids may be present in the submitted sample  additional confirmatory testing may be necessary for epidemiological  and / or clinical management purposes  to differentiate between  SARS-CoV-2 and other Sarbecovirus currently known to infect humans.  If clinically indicated additional testing with an alternate test  methodology [phone]) is advised. The SARS-CoV-2 RNA is generally  detectable in upper and lower respiratory sp ecimens during the acute  phase of infection. The expected result is Negative. Fact Sheet for Patients:  StrictlyIdeas.no Fact Sheet for Healthcare Providers: BankingDealers.co.za This test is not yet approved or cleared by the [place] FDA and has been authorized for detection and/or diagnosis of SARS-CoV-2 by FDA under an Emergency Use Authorization (EUA).  This EUA will remain in effect (meaning this test can be used) for the duration of the COVID-19 declaration under Section 564(b)(1) of the Act, 21 U.S.C. section 360bbb-3(b)(1), unless the authorization is terminated or revoked sooner. Performed at [facility], [address], [place], [place] [address]   Culture, blood (routine x 2)     Status: None (Preliminary result)   Collection Time: 12/03/18 12:49 PM   Specimen: BLOOD RIGHT HAND  Result Value Ref Range Status   Specimen Description BLOOD RIGHT HAND  Final   Special Requests   Final    BOTTLES DRAWN AEROBIC AND ANAEROBIC Blood Culture adequate volume    Culture   Final    NO GROWTH 4 DAYS Performed at [facility], [address], [place], [place] [address]    Report Status PENDING  Incomplete  MRSA PCR Screening     Status: None   Collection Time: 12/03/18  4:25 PM   Specimen: Nasopharyngeal  Result Value Ref Range Status   MRSA by PCR NEGATIVE NEGATIVE Final    Comment:        The GeneXpert MRSA Assay (FDA approved for NASAL specimens only), is one component of a comprehensive MRSA colonization surveillance program. It is not intended to diagnose MRSA infection nor to guide or monitor treatment for MRSA infections. Performed at [facility], [address], [place], [place] [address]   Culture, body fluid-bottle     Status: None (Preliminary result)   Collection Time: 12/07/18 11:51 AM   Specimen: Ascitic  Result Value Ref Range Status   Specimen Description ASCITIC BOTTLES DRAWN AEROBIC AND ANAEROBIC  Final   Special Requests   Final    10 CC EACH Performed at [facility], [address], [place], [place]  [address]    Culture PENDING  Incomplete   Report Status PENDING  Incomplete  Gram stain     Status: None   Collection Time: 12/07/18 11:52 AM   Specimen: Ascitic; Body Fluid  Result Value Ref Range Status   Specimen Description ASCITIC  Final   Special Requests NONE  Final   Gram Stain   Final    CYTOSPIN SMEAR NO ORGANISMS SEEN WBC PRESENT,BOTH PMN AND MONONUCLEAR Performed at [facility], [address] [place], [place] [address]    Report Status 12/07/2018 FINAL  Final     Labs: BNP (last 3 results) Recent Labs    10/25/18 1726  BNP 70.2   Basic Metabolic Panel: Recent Labs  Lab 12/04/18 0511 12/05/18 0508 12/06/18 0456 12/07/18 0459 12/08/18 0457  NA 136 136 133* 128* 130*  K 3.5 4.0 4.0 3.6 3.8  CL 104 104 101 97* 99  CO2 24 25 24  21* 23  GLUCOSE 95 117* 108* 124* 115*  BUN 29* 29* 30* 35* 37*  CREATININE 1.28* 1.36* 1.28* 1.39* 1.22*  CALCIUM 8.3* 8.4* 8.6* 8.5* 8.7*  MG 2.1  --   --    --   --    Liver Function Tests: Recent Labs  Lab 12/04/18 0511 12/05/18 0508 12/06/18 0456 12/07/18 0459 12/08/18 0457  AST 52* 54* 45* 45* 34  ALT 26 27 21 24 18   ALKPHOS 61 80 67 64 57  BILITOT 3.5* 2.9* 3.7* 3.7* 3.7*  PROT 5.8* 5.9* 6.0* 6.2* 5.8*  ALBUMIN 2.2* 2.2* 2.8* 3.1* 3.2*   Recent Labs  Lab 12/03/18 1054  LIPASE 39   Recent Labs  Lab 12/04/18 0511 12/05/18 0508 12/06/18 0455 12/07/18 0459 12/08/18 0457  AMMONIA 64* 87* 45* 44* 44*   CBC: Recent Labs  Lab 12/04/18 0511 12/05/18 0508 12/06/18 0456 12/07/18 0459 12/08/18 0457  WBC 11.0* 10.0 8.7 9.6 10.4  HGB 9.1* 9.3* 8.9* 8.3* 8.6*  HCT 28.0* 28.8* 27.7* 25.0* 25.7*  MCV 97.6 99.0 98.9 96.5 97.0  PLT 143* 138* 126* 128* 126*   Cardiac Enzymes: No results for input(s): CKTOTAL, CKMB, CKMBINDEX, TROPONINI in the last 168 hours. BNP: Invalid input(s): POCBNP CBG: Recent Labs  Lab 12/03/18 1108  GLUCAP 157*   D-Dimer No results for input(s): DDIMER in the last 72 hours. Hgb A1c No results for input(s): HGBA1C in the last 72 hours. Lipid Profile No results for input(s): CHOL, HDL, LDLCALC, TRIG, CHOLHDL, LDLDIRECT in the last 72 hours. Thyroid function studies No results for input(s): TSH, T4TOTAL, T3FREE, THYROIDAB in the last 72 hours.  Invalid input(s): FREET3 Anemia work up No results for input(s): VITAMINB12, FOLATE, FERRITIN, TIBC, IRON, RETICCTPCT in the last 72 hours. Urinalysis    Component Value Date/Time   COLORURINE YELLOW 12/03/2018 1202   APPEARANCEUR HAZY (A) 12/03/2018 1202   LABSPEC 1.011 12/03/2018 1202   PHURINE 5.0 12/03/2018 1202   GLUCOSEU NEGATIVE 12/03/2018 1202   HGBUR MODERATE (A) 12/03/2018 1202   BILIRUBINUR NEGATIVE 12/03/2018 1202   KETONESUR NEGATIVE 12/03/2018 1202   PROTEINUR NEGATIVE 12/03/2018 1202   NITRITE NEGATIVE 12/03/2018 1202   LEUKOCYTESUR NEGATIVE 12/03/2018 1202   Sepsis Labs Invalid input(s): PROCALCITONIN,  WBC,   LACTICIDVEN Microbiology Recent Results (from the past 240 hour(s))  Culture, blood (routine x 2)     Status: None (Preliminary result)   Collection Time: 12/03/18 10:54 AM   Specimen: BLOOD LEFT ARM  Result Value Ref Range Status   Specimen Description BLOOD LEFT ARM  DRAWN BY RN  Final   Special Requests   Final    BOTTLES DRAWN AEROBIC AND ANAEROBIC Blood Culture results may not be optimal due to an excessive volume of blood received in culture bottles   Culture   Final    NO GROWTH 4 DAYS Performed at [facility], [address] [place], [place] [address]    Report Status PENDING  Incomplete  SARS Coronavirus 2 [facility] order, Performed in [facility] hospital lab) Nasopharyngeal Nasopharyngeal Swab     Status: None   Collection Time: 12/03/18 11:32 AM   Specimen: Nasopharyngeal Swab  Result Value Ref Range Status   SARS Coronavirus 2 NEGATIVE NEGATIVE Final    Comment: (NOTE) If result is NEGATIVE SARS-CoV-2 target nucleic acids are NOT DETECTED. The SARS-CoV-2 RNA is generally detectable in upper and lower  respiratory specimens during the acute phase of infection. The lowest  concentration of SARS-CoV-2 viral copies this assay can detect is 250  copies / mL. A negative result does not preclude SARS-CoV-2 infection  and should not be used as the sole basis for treatment or other  patient management decisions.  A negative result may occur with  improper specimen collection / handling, submission of specimen other  than nasopharyngeal swab, presence of viral mutation(s) within the  areas targeted by this assay, and inadequate number of viral copies  (<250 copies / mL). A negative result must be combined with clinical  observations, patient history, and epidemiological information. If result is POSITIVE SARS-CoV-2 target nucleic acids are DETECTED. The SARS-CoV-2 RNA is generally detectable in upper and lower  respiratory specimens dur ing the acute phase of infection.   Positive  results are indicative of active infection with SARS-CoV-2.  Clinical  correlation with patient history and other diagnostic information is  necessary to determine patient infection status.  Positive results do  not rule out bacterial infection or co-infection with other viruses. If result is PRESUMPTIVE POSTIVE SARS-CoV-2 nucleic acids MAY BE PRESENT.   A presumptive positive result was obtained on the submitted specimen  and confirmed on repeat testing.  While 2019 novel coronavirus  (SARS-CoV-2) nucleic acids may be present in the submitted sample  additional confirmatory testing may be necessary for epidemiological  and / or clinical management purposes  to differentiate between  SARS-CoV-2 and other Sarbecovirus currently known to infect humans.  If clinically indicated additional testing with an alternate test  methodology [id number]) is advised. The SARS-CoV-2 RNA is generally  detectable in upper and lower respiratory sp ecimens during the acute  phase of infection. The expected result is Negative. Fact Sheet for Patients:  StrictlyIdeas.no Fact Sheet for Healthcare Providers: BankingDealers.co.za This test is not yet approved or cleared by the [place] FDA and has been authorized for detection and/or diagnosis of SARS-CoV-2 by FDA under an Emergency Use Authorization (EUA).  This EUA will remain in effect (meaning this test can be used) for the duration of the COVID-19 declaration under Section 564(b)(1) of the Act, 21 U.S.C. section 360bbb-3(b)(1), unless the authorization is terminated or revoked sooner. Performed at [facility], [address] [place], [place] [address]   Culture, blood (routine x 2)     Status: None (Preliminary result)   Collection Time: 12/03/18 12:49 PM   Specimen: BLOOD RIGHT HAND  Result Value Ref Range Status   Specimen Description BLOOD RIGHT HAND  Final   Special Requests   Final     BOTTLES DRAWN AEROBIC AND ANAEROBIC Blood Culture  adequate volume   Culture   Final    NO GROWTH 4 DAYS Performed at [facility], [address], [place], [place] [address]    Report Status PENDING  Incomplete  MRSA PCR Screening     Status: None   Collection Time: 12/03/18  4:25 PM   Specimen: Nasopharyngeal  Result Value Ref Range Status   MRSA by PCR NEGATIVE NEGATIVE Final    Comment:        The GeneXpert MRSA Assay (FDA approved for NASAL specimens only), is one component of a comprehensive MRSA colonization surveillance program. It is not intended to diagnose MRSA infection nor to guide or monitor treatment for MRSA infections. Performed at [facility], [address], [place], [place] [address]   Culture, body fluid-bottle     Status: None (Preliminary result)   Collection Time: 12/07/18 11:51 AM   Specimen: Ascitic  Result Value Ref Range Status   Specimen Description ASCITIC BOTTLES DRAWN AEROBIC AND ANAEROBIC  Final   Special Requests   Final    10 CC EACH Performed at [facility], [address] [place], [place] [address]    Culture PENDING  Incomplete   Report Status PENDING  Incomplete  Gram stain     Status: None   Collection Time: 12/07/18 11:52 AM   Specimen: Ascitic; Body Fluid  Result Value Ref Range Status   Specimen Description ASCITIC  Final   Special Requests NONE  Final   Gram Stain   Final    CYTOSPIN SMEAR NO ORGANISMS SEEN WBC PRESENT,BOTH PMN AND MONONUCLEAR Performed at [facility], [address], [place], [place] [address]    Report Status 12/07/2018 FINAL  Final     Time coordinating discharge: 35 minutes  SIGNED:   Rodena Goldmann, DO [facility] 12/08/2018, 7:00 AM  If 7PM-7AM, please contact night-coverage [email or web address] Password [email or web address]

## 2018-12-08 NOTE — Progress Notes (Signed)
Patient alert and oriented x4. No complaints of pain, shortness of breath, chest pain, dizziness, nausea or vomiting. Patient up out of bed, ambulatory in room with supervision. Gait steady. Patient tolerated meds and diet well. Appetite good. IV removed without complications. Discharge instructions, appointment follow ups and medication education gone over with patient and patient's daughter. Both expressed full understanding of instructions, appointment information and education with teach back. Patient discharged home with all belongings via car (daughter driving patient home).

## 2018-12-08 NOTE — TOC Transition Note (Addendum)
Transition of Care [facility]) - CM/SW Discharge Note   Patient Details  Name: Jordan Le MRN: [id number] Date of Birth: [birth year]  Transition of Care [facility]) CM/SW Contact:  Talaya Lamprecht, Chauncey Reading, RN Phone Number: 12/08/2018, 9:01 AM   Clinical Narrative:   Patient discharging home today. Notified Sharyn Lull of [facility]. PCP appt scheduled. Also has follow-up with liver clinic on 9/22.       Patient Goals and CMS Choice Patient states their goals for this hospitalization and ongoing recovery are:: to go home. CMS Medicare.gov Compare Post Acute Care list provided to:: Patient Represenative (must comment) Choice offered to / list presented to : Adult Children     Discharge Plan and Services     Post Acute Care Choice: Home Health                    HH Arranged: Social Work, Nurse's Aide, PT [place] Date [place]: 12/04/18 Time [place]: 8841 Representative spoke with at [place]: [place] (SDOH) Interventions     Readmission Risk Interventions Readmission Risk Prevention Plan 12/08/2018 12/04/2018  Transportation Screening - Complete  HRI or [place] - Complete  Social Work Consult for [place] Planning/Counseling - Complete  Palliative Care Screening - Not Complete  Medication Review Press photographer) - Complete  PCP or Specialist appointment within 3-5 days of discharge Complete -  [place] or [place] Complete -  SW Recovery Care/Counseling Consult Complete -  Palliative Care Screening Not Applicable -  [place] Not Applicable -  Some recent data might be hidden

## 2018-12-09 DIAGNOSIS — K7290 Hepatic failure, unspecified without coma: Secondary | ICD-10-CM | POA: Diagnosis not present

## 2018-12-09 DIAGNOSIS — E119 Type 2 diabetes mellitus without complications: Secondary | ICD-10-CM | POA: Diagnosis not present

## 2018-12-09 DIAGNOSIS — R188 Other ascites: Secondary | ICD-10-CM | POA: Diagnosis not present

## 2018-12-09 LAB — PATHOLOGIST SMEAR REVIEW

## 2018-12-11 DIAGNOSIS — K7290 Hepatic failure, unspecified without coma: Secondary | ICD-10-CM | POA: Diagnosis not present

## 2018-12-11 DIAGNOSIS — R188 Other ascites: Secondary | ICD-10-CM | POA: Diagnosis not present

## 2018-12-11 DIAGNOSIS — E119 Type 2 diabetes mellitus without complications: Secondary | ICD-10-CM | POA: Diagnosis not present

## 2018-12-12 LAB — CULTURE, BODY FLUID W GRAM STAIN -BOTTLE
Culture: NO GROWTH
Special Requests: 10

## 2018-12-15 DIAGNOSIS — K7581 Nonalcoholic steatohepatitis (NASH): Secondary | ICD-10-CM | POA: Diagnosis not present

## 2018-12-16 ENCOUNTER — Telehealth: Payer: Self-pay | Admitting: Nurse Practitioner

## 2018-12-16 DIAGNOSIS — R188 Other ascites: Secondary | ICD-10-CM | POA: Diagnosis not present

## 2018-12-16 DIAGNOSIS — K7290 Hepatic failure, unspecified without coma: Secondary | ICD-10-CM | POA: Diagnosis not present

## 2018-12-16 DIAGNOSIS — E119 Type 2 diabetes mellitus without complications: Secondary | ICD-10-CM | POA: Diagnosis not present

## 2018-12-16 NOTE — Telephone Encounter (Signed)
Please tell the patient that based on her labs and imaging and discussion with Dr. Oneida Alar we recommend a liver biopsy to see if we can find the cause of her liver disease (likely fatty liver over a long period of time). Let me know if she is ok with this and I can work on ordering the biopsy.  Also, because of her liver we would like her to be seen by a liver transplant/hepatology center to evaluate her. We typically use the [facility] in [place] (associated with [facility] in [place]) or [facility] in [place]. Please let us know which she would prefer.

## 2018-12-17 ENCOUNTER — Telehealth: Payer: Self-pay

## 2018-12-17 ENCOUNTER — Other Ambulatory Visit: Payer: Self-pay

## 2018-12-17 DIAGNOSIS — R188 Other ascites: Secondary | ICD-10-CM

## 2018-12-17 NOTE — Telephone Encounter (Signed)
PLEASE CALL PT'S DAUGHTER. WE CAN SCHEDULE THE PARACENTESIS WITH ALBUMIN PROTOCOL, Dx: ASCITES.  WE WILL GET RECORDS FROM TRANSPLANT CLINIC AND THEN I CAN CALL HER DAUGHTER TO DISCUSS.

## 2018-12-17 NOTE — Telephone Encounter (Signed)
Para scheduled for 12/22/18 at 10:00am, arrive at 9:45am. Called and informed daughter, [name].

## 2018-12-17 NOTE — Telephone Encounter (Signed)
Pt went to see the Liver Transplant Clinic this week.  Pt was referred to by our office. The doctor told pt and pt's daughter that there isn't anything they can do for pt. They would like pt to be seen at our office. Supplements were recommended for pt to take and pt's daughter would like to know if [facility] can send these as an RX to help with the cost. Supplements are Zinc, Branch Chain Amino Acids, L/omithine l/aspartate, l/carnitine.   The liver doctor also recommended changing pt's fluid pill and pt will need a paracentesis. Please advise.   Daughter's contact info [phone]  Manuela Schwartz- can we request notes from the Liver Transplant Cinic if they haven't arrived at our office

## 2018-12-17 NOTE — Telephone Encounter (Signed)
Spoke with pt's daughter, she is aware that we will request records and then [facility] will be able to discuss further with pt's daughter.   Please arrange Paracentesis.

## 2018-12-22 ENCOUNTER — Encounter (HOSPITAL_COMMUNITY): Payer: Self-pay

## 2018-12-22 ENCOUNTER — Other Ambulatory Visit: Payer: Self-pay

## 2018-12-22 ENCOUNTER — Ambulatory Visit (HOSPITAL_COMMUNITY)
Admission: RE | Admit: 2018-12-22 | Discharge: 2018-12-22 | Disposition: A | Discharge status: Home / Self Care | Payer: PPO | Source: Ambulatory Visit | Attending: Gastroenterology | Admitting: Gastroenterology

## 2018-12-22 DIAGNOSIS — E119 Type 2 diabetes mellitus without complications: Secondary | ICD-10-CM | POA: Diagnosis not present

## 2018-12-22 DIAGNOSIS — R188 Other ascites: Secondary | ICD-10-CM | POA: Insufficient documentation

## 2018-12-22 DIAGNOSIS — K7290 Hepatic failure, unspecified without coma: Secondary | ICD-10-CM | POA: Diagnosis not present

## 2018-12-22 NOTE — Progress Notes (Signed)
Paracentesis complete no signs of distress.  

## 2018-12-22 NOTE — Procedures (Signed)
PreOperative Dx: Cirrhosis, ascites °Postoperative Dx: Cirrhosis, ascites °Procedure:   US guided paracentesis °Radiologist:  Aleksandra Raben °Anesthesia:  10 ml of1% lidocaine °Specimen:  4 L of yellow ascitic fluid °EBL:   < 1 ml °Complications: None   °

## 2018-12-23 ENCOUNTER — Ambulatory Visit: Payer: PPO | Admitting: Nurse Practitioner

## 2018-12-23 NOTE — Telephone Encounter (Signed)
Called and message said wireless customer not available and to try the call later.

## 2018-12-23 NOTE — Telephone Encounter (Signed)
REVIEWED-NO ADDITIONAL RECOMMENDATIONS. 

## 2018-12-23 NOTE — Telephone Encounter (Signed)
Requested records.

## 2018-12-24 ENCOUNTER — Other Ambulatory Visit: Payer: Self-pay

## 2018-12-24 DIAGNOSIS — K7460 Unspecified cirrhosis of liver: Secondary | ICD-10-CM

## 2018-12-24 NOTE — Telephone Encounter (Signed)
Order entered for liver biopsy. Called Central Scheduling, LMOVM to inform Aniceto Boss of order. Aniceto Boss will call pt to schedule.

## 2018-12-24 NOTE — Telephone Encounter (Signed)
Already REVIEWED NOTE FROM [facility]. THANKS!

## 2018-12-24 NOTE — Telephone Encounter (Signed)
Dr. Oneida Alar, I spoke to [place] Locks NP at [facility] liver care regarding another patient and she mentioned Jordan Le. Not a transplant candidate. SEE [facility] L[facility] NOTE UNDER MEDIA.

## 2018-12-24 NOTE — Telephone Encounter (Signed)
PT is aware of the information. She said she was seen at the [facility] in [place] on 12/13/2018.  She said OK to schedule the liver biopsy.  Forwarding back to Walden Field, NP and [facility].

## 2018-12-25 ENCOUNTER — Telehealth: Payer: Self-pay | Admitting: Gastroenterology

## 2018-12-25 NOTE — Telephone Encounter (Signed)
Returned the call to [facility] and left Vm for a return call.

## 2018-12-25 NOTE — Telephone Encounter (Signed)
[phone]  PATIENT DAUGHTER CALLED WITH A FEW QUESTIONS ABOUT HER MOTHERS MEDS

## 2018-12-28 NOTE — Telephone Encounter (Addendum)
[name] said her mom has been seen and is not a candidate for a liver transplant. However, the liver doctor advised some Supplements for her to take that would help.  [name] said pt's husband recently died and pt's finances have been decreased. She would like to know if we can send the following to the pharmacy and see if they might be covered or be cheaper.  1. Potassium 99 mg 2. Zinc 50 mg 3. Amino Acids Branched chain 4. L-arginine  L- ornithine  [name] said the doctor did not specify how much daily.  Forwarding to Walden Field, NP to advise!

## 2018-12-29 DIAGNOSIS — E119 Type 2 diabetes mellitus without complications: Secondary | ICD-10-CM | POA: Diagnosis not present

## 2018-12-29 DIAGNOSIS — K7290 Hepatic failure, unspecified without coma: Secondary | ICD-10-CM | POA: Diagnosis not present

## 2018-12-29 DIAGNOSIS — R188 Other ascites: Secondary | ICD-10-CM | POA: Diagnosis not present

## 2018-12-29 NOTE — Telephone Encounter (Signed)
Pt's daughter Jordan Le called and said her mom is staying so nauseated and can't eat hardly anything. Her mom feels like she has a lot of fluid also. They wanted to schedule an appointment and are scheduled to see Jordan Field, NP tomorrow @ 9:30 Am.

## 2018-12-30 ENCOUNTER — Other Ambulatory Visit: Payer: Self-pay | Admitting: *Deleted

## 2018-12-30 ENCOUNTER — Encounter: Payer: Self-pay | Admitting: *Deleted

## 2018-12-30 ENCOUNTER — Encounter: Payer: Self-pay | Admitting: Gastroenterology

## 2018-12-30 ENCOUNTER — Encounter: Payer: Self-pay | Admitting: Nurse Practitioner

## 2018-12-30 ENCOUNTER — Ambulatory Visit (INDEPENDENT_AMBULATORY_CARE_PROVIDER_SITE_OTHER): Payer: PPO | Admitting: Nurse Practitioner

## 2018-12-30 VITALS — BP 112/69 | HR 133 | Temp 96.8°F | Ht 63.0 in | Wt 183.8 lb

## 2018-12-30 DIAGNOSIS — K7460 Unspecified cirrhosis of liver: Secondary | ICD-10-CM

## 2018-12-30 DIAGNOSIS — R188 Other ascites: Secondary | ICD-10-CM

## 2018-12-30 DIAGNOSIS — N898 Other specified noninflammatory disorders of vagina: Secondary | ICD-10-CM

## 2018-12-30 NOTE — Patient Instructions (Addendum)
Your health issues we discussed today were:   Cirrhosis: 1. It is okay to split up your pills throughout the day to help with nausea and difficulty eating 2. We will contact the manufacturer of Xifaxan to inquire about patient assistance 3. We will put in an order for paracentesis to remove fluid off her abdomen 4. I will contact the liver clinic to get clarification on types of supplements.  We can send in a prescription to see if insurance will cover these 5. Call us with updated medication information about what diuretics you are currently taking.  We can make further adjustments based on what you are on right now 6. Continue to follow a 2 g, low-sodium diet.  This will help with swelling 7. I do not feel we need to recheck your labs or liver imaging at this time as this was just completed a few weeks ago 8. Call us if you have any worsening or concerning symptoms  Overall I recommend:  1. Continue your current medications 2. Return for follow-up and 2 months 3. Call us if you have any questions or concerns. 4. Follow-up with primary care or gynecology about the abnormality near your rectum.   Because of recent events of COVID-19 ("Coronavirus"), follow CDC recommendations:  1. Wash your hand frequently 2. Avoid touching your face 3. Stay away from people who are sick 4. If you have symptoms such as fever, cough, shortness of breath then call your healthcare provider for further guidance 5. If you are sick, STAY AT HOME unless otherwise directed by your healthcare provider. 6. Follow directions from state and national officials regarding staying safe   At [facility] we value your feedback. You may receive a survey about your visit today. Please share your experience as we strive to create trusting relationships with our patients to provide genuine, compassionate, quality care.  We appreciate your understanding and patience as we review any laboratory studies,  imaging, and other diagnostic tests that are ordered as we care for you. Our office policy is 5 business days for review of these results, and any emergent or urgent results are addressed in a timely manner for your best interest. If you do not hear from our office in 1 week, please contact us.   We also encourage the use of MyChart, which contains your medical information for your review as well. If you are not enrolled in this feature, an access code is on this after visit summary for your convenience. Thank you for allowing [place] to be involved in your care.  It was great to see you today!  I hope you have a great Fall!!

## 2018-12-30 NOTE — Telephone Encounter (Signed)
See OV notes for details.

## 2018-12-30 NOTE — Progress Notes (Signed)
Referring Provider: Manon Hilding, MD Primary Care Physician:  Manon Hilding, MD Primary GI:  Dr. Oneida Alar  Chief Complaint  Patient presents with   Nausea    occ vomiting   growth between vagina and rectum    HPI:   Jordan Le is a 73 y.o. female who presents for follow-up on cirrhosis.  The patient was last seen in our office 11/16/2018 for cirrhosis, anasarca.  We had recently seen the patient in the hospital when she was admitted from 10/25/2018 through 11/05/2018 for anasarca and acute renal failure.  Noted cirrhosis during admission, increasing peripheral edema and abdominal distention.  Started on furosemide 20 mg she was increased to 40 mg in May, added Spironolactone 3 weeks prior to admission.  Admitted with creatinine of 3.94.  Overall cirrhosis felt likely secondary to [place].  First paracentesis 10/26/2018 with 2 L removed.  Hepatitis B and C serologies were negative, autoimmune, alpha-1 antitrypsin level all normal.  At that time meld score was 26 and child Pugh class B-C.  At her last visit she notes that after her hysterectomy in March she was told she had fatty liver and no follow-up needed.  She does not drink alcohol at all.  Some abdominal swelling and discomfort, lower extremity edema much improved with wrapping her ankles and some continued diuretics.  Never had an EGD before.  Eats minimal to no salt.  No other overt GI complaints.  Recommended updated labs, hepatitis a and B vaccination, right upper quadrant ultrasound, low-salt/2 g sodium diet, EGD.  Follow-up labs 11/20/2018 found meld score improved to 22, child Pugh class C.  Recommended consider liver biopsy and referred to tertiary care center for liver transplant evaluation.  EGD completed 11/23/2018 which found mild portal hypertensive gastropathy, no varices, moderate gastritis/mild duodenitis due to aspirin.  Surgical pathology found mild gastritis.  Recommended repeat EGD in 2 to 3 years for variceal  screening.  She was again admitted to the hospital from 12/03/2018 through 12/08/2018 for acute hepatic encephalopathy, ascites, and others.  Large-volume paracentesis on 12/07/2018 with 4 L of fluid removed, elevated INR (2.2) for which she was given vitamin K.  Treated with lactulose and rifaximin.  No sign of SBP on paracentesis fluid.  Patient overall felt to have a poor prognosis with meld score of 28.  Palliative care was consulted but did not have a chance to see the patient.  Likely continue evaluation for hospice care near future.  The patient was seen by [facility] liver clinic on 12/15/2018 and felt not a transplant candidate.  Recommendations were given which can be found in the media tab.  There were some recommendations for supplements including zinc, branched chain amino acids, L-ornithine and L-aspartate (LOLA). I have reached out to [facility] clinic for clarification.  Today she states she's doing so-so overall. Has days where she can't eat or drink much; tends to be worse in the mornings Was receiving all pills in the morning. Divided them up and did better this way yesterday. Not on Xifaxan due to cost ($800). Is asking about recommended supplements made by liver clinic (OTC supplements); we can send in an Rx, unsure if it would be covered. Has noted more abdominal swelling and is asking about another paracentesis. Not sure if she' son the right diuretics and her daughter will call to update [place] for any needed corrections. She is "sorta" following a low salt diet. She is using Ensure. Minimal nausea (once every few  days). Poor appetite. Denies abdominal pain, vomiting, fever, chills, unintentional weight loss. States she has a "spongy growth" between her rectum and vagina that popped up in the last couple weeks. Denies yellowing of skin/eyes, darkened urine, hematochezia, melena, acute episodic confusion/ Has had some more hand fine tremors. Denies URI or flu-like symptoms. Denies loss of sense  of taste or smell. Denies chest pain, dyspnea, dizziness, lightheadedness, syncope, near syncope. Denies any other upper or lower GI symptoms.  NOTE: A total of 30 minutes was spent with the patient, of which 50%+ was spend on education and care coordination  Past Medical History:  Diagnosis Date   Arthritis    Cancer [facility])    endometrial cancer   Cirrhosis of liver ([place])    Diabetes mellitus without complication ([place])    type 2   High cholesterol    Hypertension    Mini stroke ([place])    2011   Pneumonia    Renal disorder     Past Surgical History:  Procedure Laterality Date   ABDOMINAL HYSTERECTOMY     BIOPSY  11/23/2018   Procedure: BIOPSY;  Surgeon: Danie Binder, MD;  Location: [facility];  Service: Endoscopy;;   BREAST BIOPSY Right    ~15 years ago-2005   ESOPHAGOGASTRODUODENOSCOPY N/A 11/23/2018   Procedure: ESOPHAGOGASTRODUODENOSCOPY (EGD);  Surgeon: Danie Binder, MD;  Location: [facility] END[facility];  Service: Endoscopy;  Laterality: N/A;  1:00pm   OTHER SURGICAL HISTORY  2009   Uterine Polyp removal   polyps removed from the uterus     ROBOTIC ASSISTED TOTAL HYSTERECTOMY WITH BILATERAL SALPINGO OOPHERECTOMY Bilateral 05/07/2018   Procedure: XI ROBOTIC ASSISTED TOTAL HYSTERECTOMY WITH BILATERAL SALPINGO OOPHORECTOMY PELVIC LYMPHANECTOMY;  Surgeon: Everitt Amber, MD;  Location: [name];  Service: Gynecology;  Laterality: Bilateral;   SENTINEL NODE BIOPSY Bilateral 05/07/2018   Procedure: SENTINEL NODE BIOPSY;  Surgeon: Everitt Amber, MD;  Location: [name];  Service: Gynecology;  Laterality: Bilateral;    Current Outpatient Medications  Medication Sig Dispense Refill   atorvastatin (LIPITOR) 10 MG tablet Take 1 tablet (10 mg total) by mouth daily at 6 PM. 30 tablet 2   diltiazem (CARDIZEM CD) 120 MG 24 hr capsule Take 1 capsule (120 mg total) by mouth daily. 30 capsule 11   docusate sodium (COLACE) 100 MG capsule Take 1 capsule (100 mg total) by mouth daily.  10 capsule 0   feeding supplement, ENSURE ENLIVE, (ENSURE ENLIVE) LIQD Take 237 mLs by mouth 2 (two) times daily between meals. (Patient taking differently: Take 237 mLs by mouth daily at 2 PM. ) 237 mL 12   lactulose (CHRONULAC) 10 GM/15ML solution Take 30 mLs (20 g total) by mouth 3 (three) times daily. 236 mL 0   levothyroxine (SYNTHROID) 75 MCG tablet Take 75 mcg by mouth daily before breakfast.      Multiple Vitamin (MULTIVITAMIN WITH MINERALS) TABS tablet Take 1 tablet by mouth daily. 30 tablet 2   nystatin (MYCOSTATIN/NYSTOP) powder Apply topically 3 (three) times daily. (Patient taking differently: Apply 1 g topically daily at 2 PM. ) 15 g 0   omeprazole (PRILOSEC) 20 MG capsule 1 PO 30 MINS PRIOR TO BREAKFAST. (Patient taking differently: Take 20 mg by mouth daily before breakfast. 1 PO 30 MINS PRIOR TO BREAKFAST.) 90 capsule 3   Potassium 99 MG TABS Take 1 tablet by mouth daily.     spironolactone (ALDACTONE) 50 MG tablet Take 1 tablet (50 mg total) by mouth daily. 30 tablet  3   torsemide (DEMADEX) 20 MG tablet Take 1 tablet (20 mg total) by mouth daily. 30 tablet 3   rifaximin (XIFAXAN) 550 MG TABS tablet Take 1 tablet (550 mg total) by mouth 2 (two) times daily. (Patient not taking: Reported on 12/30/2018) 60 tablet 3   No current facility-administered medications for this visit.     Allergies as of 12/30/2018   (No Known Allergies)    Family History  Problem Relation Age of Onset   Lung cancer Mother    Hypertension Father    Pancreatic cancer Brother    Liver disease Neg Hx     Social History   Socioeconomic History   Marital status: Married    Spouse name: Not on file   Number of children: Not on file   Years of education: Not on file   Highest education level: Not on file  Occupational History   Not on file  Social Needs   Financial resource strain: Not on file   Food insecurity    Worry: Not on file    Inability: Not on file    Transportation needs    Medical: Not on file    Non-medical: Not on file  Tobacco Use   Smoking status: Never Smoker   Smokeless tobacco: Never Used  Substance and Sexual Activity   Alcohol use: Never    Frequency: Never   Drug use: Never   Sexual activity: Not Currently  Lifestyle   Physical activity    Days per week: Not on file    Minutes per session: Not on file   Stress: Not on file  Relationships   Social connections    Talks on phone: Not on file    Gets together: Not on file    Attends religious service: Not on file    Active member of club or organization: Not on file    Attends meetings of clubs or organizations: Not on file    Relationship status: Not on file  Other Topics Concern   Not on file  Social History Narrative   Not on file    Review of Systems: General: Negative for anorexia, fever, chills. Admits poor appetite. ENT: Negative for hoarseness, difficulty swallowing. CV: Negative for chest pain, angina, palpitations, dyspnea on exertion, peripheral edema.  Respiratory: Negative for dyspnea at rest, cough, sputum, wheezing.  GI: See history of present illness. GU:  Admits "spongy" area between rectum and vagina she'd like to have evaluated.  MS: Negative for joint pain, low back pain.  Derm: Negative for rash or itching.  Endo: Negative for unusual weight change.  Heme: Negative for bruising or bleeding. Allergy: Negative for rash or hives.   Physical Exam: BP 112/69    Pulse (!) 133    Temp (!) 96.8 F (36 C) (Temporal)    Ht 5\' 3"  (1.6 m)    Wt 183 lb 12.8 oz (83.4 kg)    BMI 32.56 kg/m  General:   Alert and oriented. Pleasant and cooperative. Well-nourished and well-developed.  Head:  Normocephalic and atraumatic. Eyes:  Without icterus, sclera clear and conjunctiva pink.  Ears:  Normal auditory acuity. Cardiovascular:  S1, S2 present without murmurs appreciated. Extremities without clubbing or edema. Respiratory:  Clear to  auscultation bilaterally. No wheezes, rales, or rhonchi. No distress.  Gastrointestinal:  +BS, soft, non-tender. Mild to moderate distention with firmness; no "tense ascites" presentation. No HSM noted. No guarding or rebound. No masses appreciated.  Rectal:  3-4 deflated hemorrhoid  tags. Apparent protrusion from vagina quera prolapsed bladder or vaginal wall.  Musculoskalatal:  Symmetrical without gross deformities. Neurologic:  Alert and oriented x4;  grossly normal neurologically. Psych:  Alert and cooperative. Normal mood and affect. Heme/Lymph/Immune: No excessive bruising noted.    12/30/2018 10:20 AM   Disclaimer: This note was dictated with voice recognition software. Similar sounding words can inadvertently be transcribed and may not be corrected upon review.

## 2018-12-31 DIAGNOSIS — R188 Other ascites: Secondary | ICD-10-CM | POA: Diagnosis not present

## 2018-12-31 DIAGNOSIS — E119 Type 2 diabetes mellitus without complications: Secondary | ICD-10-CM | POA: Diagnosis not present

## 2018-12-31 DIAGNOSIS — K7290 Hepatic failure, unspecified without coma: Secondary | ICD-10-CM | POA: Diagnosis not present

## 2019-01-01 ENCOUNTER — Other Ambulatory Visit: Payer: Self-pay

## 2019-01-01 ENCOUNTER — Other Ambulatory Visit: Payer: Self-pay | Admitting: Radiology

## 2019-01-01 ENCOUNTER — Ambulatory Visit (HOSPITAL_COMMUNITY)
Admission: RE | Admit: 2019-01-01 | Discharge: 2019-01-01 | Disposition: A | Discharge status: Home / Self Care | Payer: PPO | Source: Ambulatory Visit | Attending: Nurse Practitioner | Admitting: Nurse Practitioner

## 2019-01-01 DIAGNOSIS — R188 Other ascites: Secondary | ICD-10-CM | POA: Diagnosis not present

## 2019-01-01 LAB — GRAM STAIN: Gram Stain: NONE SEEN

## 2019-01-01 LAB — BODY FLUID CELL COUNT WITH DIFFERENTIAL
Eos, Fluid: 0 %
Lymphs, Fluid: 87 %
Monocyte-Macrophage-Serous Fluid: 6 % — ABNORMAL LOW (ref 50–90)
Neutrophil Count, Fluid: 7 % (ref 0–25)
Other Cells, Fluid: 1 %
Total Nucleated Cell Count, Fluid: 271 cu mm (ref 0–1000)

## 2019-01-01 NOTE — Procedures (Signed)
PreOperative Dx: Cirrhosis, ascites Postoperative Dx: Cirrhosis, ascites Procedure:   US guided paracentesis Radiologist:  Thornton Papas Anesthesia:  10 ml of1% lidocaine Specimen:  4.2 L of amber colored ascitic fluid EBL:   < 1 ml Complications: None

## 2019-01-01 NOTE — Progress Notes (Signed)
Ultra sound guided paracentesis complete, patient tolerated well, vitals stable. 4.2 L removed

## 2019-01-04 ENCOUNTER — Other Ambulatory Visit (HOSPITAL_COMMUNITY): Payer: Self-pay | Admitting: Nurse Practitioner

## 2019-01-04 ENCOUNTER — Encounter (HOSPITAL_COMMUNITY): Payer: Self-pay

## 2019-01-04 ENCOUNTER — Other Ambulatory Visit: Payer: Self-pay | Admitting: Nurse Practitioner

## 2019-01-04 ENCOUNTER — Ambulatory Visit (HOSPITAL_COMMUNITY)
Admission: RE | Admit: 2019-01-04 | Discharge: 2019-01-04 | Disposition: A | Discharge status: Home / Self Care | Payer: PPO | Source: Ambulatory Visit | Attending: Nurse Practitioner | Admitting: Nurse Practitioner

## 2019-01-04 ENCOUNTER — Other Ambulatory Visit: Payer: Self-pay

## 2019-01-04 DIAGNOSIS — Z9889 Other specified postprocedural states: Secondary | ICD-10-CM

## 2019-01-04 DIAGNOSIS — Z801 Family history of malignant neoplasm of trachea, bronchus and lung: Secondary | ICD-10-CM | POA: Diagnosis not present

## 2019-01-04 DIAGNOSIS — Z79890 Hormone replacement therapy (postmenopausal): Secondary | ICD-10-CM | POA: Diagnosis not present

## 2019-01-04 DIAGNOSIS — Z800 Family history of malignant neoplasm of digestive organs: Secondary | ICD-10-CM | POA: Insufficient documentation

## 2019-01-04 DIAGNOSIS — Z79899 Other long term (current) drug therapy: Secondary | ICD-10-CM | POA: Diagnosis not present

## 2019-01-04 DIAGNOSIS — Z8542 Personal history of malignant neoplasm of other parts of uterus: Secondary | ICD-10-CM | POA: Insufficient documentation

## 2019-01-04 DIAGNOSIS — Z539 Procedure and treatment not carried out, unspecified reason: Secondary | ICD-10-CM | POA: Insufficient documentation

## 2019-01-04 DIAGNOSIS — I10 Essential (primary) hypertension: Secondary | ICD-10-CM | POA: Diagnosis not present

## 2019-01-04 DIAGNOSIS — E785 Hyperlipidemia, unspecified: Secondary | ICD-10-CM | POA: Diagnosis not present

## 2019-01-04 DIAGNOSIS — E119 Type 2 diabetes mellitus without complications: Secondary | ICD-10-CM | POA: Insufficient documentation

## 2019-01-04 DIAGNOSIS — R188 Other ascites: Secondary | ICD-10-CM

## 2019-01-04 DIAGNOSIS — Z8249 Family history of ischemic heart disease and other diseases of the circulatory system: Secondary | ICD-10-CM | POA: Insufficient documentation

## 2019-01-04 DIAGNOSIS — K7460 Unspecified cirrhosis of liver: Secondary | ICD-10-CM | POA: Diagnosis not present

## 2019-01-04 DIAGNOSIS — M1990 Unspecified osteoarthritis, unspecified site: Secondary | ICD-10-CM | POA: Insufficient documentation

## 2019-01-04 DIAGNOSIS — Z8673 Personal history of transient ischemic attack (TIA), and cerebral infarction without residual deficits: Secondary | ICD-10-CM | POA: Insufficient documentation

## 2019-01-04 DIAGNOSIS — E7800 Pure hypercholesterolemia, unspecified: Secondary | ICD-10-CM | POA: Diagnosis not present

## 2019-01-04 LAB — CBC
HCT: 32.1 % — ABNORMAL LOW (ref 36.0–46.0)
Hemoglobin: 11.2 g/dL — ABNORMAL LOW (ref 12.0–15.0)
MCH: 32.1 pg (ref 26.0–34.0)
MCHC: 34.9 g/dL (ref 30.0–36.0)
MCV: 92 fL (ref 80.0–100.0)
Platelets: 203 10*3/uL (ref 150–400)
RBC: 3.49 MIL/uL — ABNORMAL LOW (ref 3.87–5.11)
RDW: 15.9 % — ABNORMAL HIGH (ref 11.5–15.5)
WBC: 10.4 10*3/uL (ref 4.0–10.5)
nRBC: 0 % (ref 0.0–0.2)

## 2019-01-04 LAB — PATHOLOGIST SMEAR REVIEW

## 2019-01-04 LAB — GLUCOSE, CAPILLARY: Glucose-Capillary: 101 mg/dL — ABNORMAL HIGH (ref 70–99)

## 2019-01-04 LAB — PROTIME-INR
INR: 1.8 — ABNORMAL HIGH (ref 0.8–1.2)
Prothrombin Time: 20.3 seconds — ABNORMAL HIGH (ref 11.4–15.2)

## 2019-01-04 LAB — APTT: aPTT: 33 seconds (ref 24–36)

## 2019-01-04 MED ORDER — LIDOCAINE HCL (PF) 1 % IJ SOLN
INTRAMUSCULAR | Status: AC
  Filled 2019-01-04: qty 30

## 2019-01-04 MED ORDER — GELATIN ABSORBABLE 12-7 MM EX MISC
CUTANEOUS | Status: AC
  Filled 2019-01-04: qty 1

## 2019-01-04 MED ORDER — SODIUM CHLORIDE 0.9 % IV SOLN: INTRAVENOUS | Status: DC

## 2019-01-04 NOTE — Progress Notes (Signed)
Procedure cancelled by Dr. Anselm Pancoast. D/C home in wheelchair with grandaughte/ Awake and alert. In no distress.

## 2019-01-04 NOTE — H&P (Addendum)
Chief Complaint: Patient was seen in consultation today for random liver biopsy.  Referring Physician(s): Gill,Eric A  Supervising Physician: Markus Daft  Patient Status: [facility] - Out-pt  History of Present Illness: Jordan Le is a 73 y.o. female with a past medical history significant for endometrial cancer, DM, HTN, HLD and cirrhosis with recurrent ascites requiring frequent paracenteses who presents today for a random liver biopsy to further evaluate her worsening cirrhosis. Patient reports she has been "sick forever" and often has abdominal pain, nausea and poor appetite although she feels better today than she has in awhile. She states she is here to have a liver biopsy to "find out what's going on in there." She states understanding of procedure and wishes to proceed.   Past Medical History:  Diagnosis Date   Arthritis    Cancer [facility])    endometrial cancer   Cirrhosis of liver ([place])    Diabetes mellitus without complication ([place])    type 2   High cholesterol    Hypertension    Mini stroke ([place])    2011   Pneumonia    Renal disorder     Past Surgical History:  Procedure Laterality Date   ABDOMINAL HYSTERECTOMY     BIOPSY  11/23/2018   Procedure: BIOPSY;  Surgeon: Danie Binder, MD;  Location: [facility] ENDO SUITE;  Service: Endoscopy;;   BREAST BIOPSY Right    ~15 years ago-2005   ESOPHAGOGASTRODUODENOSCOPY N/A 11/23/2018   Procedure: ESOPHAGOGASTRODUODENOSCOPY (EGD);  Surgeon: Danie Binder, MD;  Location: [facility] ENDO SUITE;  Service: Endoscopy;  Laterality: N/A;  1:00pm   OTHER SURGICAL HISTORY  2009   Uterine Polyp removal   polyps removed from the uterus     ROBOTIC ASSISTED TOTAL HYSTERECTOMY WITH BILATERAL SALPINGO OOPHERECTOMY Bilateral 05/07/2018   Procedure: XI ROBOTIC ASSISTED TOTAL HYSTERECTOMY WITH BILATERAL SALPINGO OOPHORECTOMY PELVIC LYMPHANECTOMY;  Surgeon: Everitt Amber, MD;  Location: [name];  Service: Gynecology;  Laterality: Bilateral;    SENTINEL NODE BIOPSY Bilateral 05/07/2018   Procedure: SENTINEL NODE BIOPSY;  Surgeon: Everitt Amber, MD;  Location: [name];  Service: Gynecology;  Laterality: Bilateral;    Allergies: Patient has no known allergies.  Medications: Prior to Admission medications   Medication Sig Start Date End Date Taking? Authorizing Provider  atorvastatin (LIPITOR) 10 MG tablet Take 1 tablet (10 mg total) by mouth daily at 6 PM. 11/05/18  Yes Emokpae, Courage, MD  diltiazem (CARDIZEM CD) 120 MG 24 hr capsule Take 1 capsule (120 mg total) by mouth daily. 11/05/18 11/05/19 Yes Emokpae, Courage, MD  docusate sodium (COLACE) 100 MG capsule Take 1 capsule (100 mg total) by mouth daily. 12/08/18  Yes Shah, Pratik D, DO  feeding supplement, ENSURE ENLIVE, (ENSURE ENLIVE) LIQD Take 237 mLs by mouth 2 (two) times daily between meals. Patient taking differently: Take 237 mLs by mouth daily at 2 PM.  11/05/18  Yes Emokpae, Courage, MD  lactulose (CHRONULAC) 10 GM/15ML solution Take 30 mLs (20 g total) by mouth 3 (three) times daily. Patient taking differently: Take 20 g by mouth 2 (two) times daily as needed for moderate constipation.  12/08/18  Yes Manuella Ghazi, Pratik D, DO  levothyroxine (SYNTHROID) 75 MCG tablet Take 75 mcg by mouth daily before breakfast.  10/05/18 10/30/19 Yes [provider]  Multiple Vitamin (MULTIVITAMIN WITH MINERALS) TABS tablet Take 1 tablet by mouth daily. 11/06/18  Yes Emokpae, Courage, MD  nystatin (MYCOSTATIN/NYSTOP) powder Apply topically 3 (three) times daily. Patient taking differently: Apply 1  g topically daily at 2 PM.  11/05/18  Yes Emokpae, Courage, MD  omeprazole (PRILOSEC) 20 MG capsule 1 PO 30 MINS PRIOR TO BREAKFAST. Patient taking differently: Take 20 mg by mouth daily before breakfast. 1 PO 30 MINS PRIOR TO BREAKFAST. 11/23/18  Yes Fields, Sandi L, MD  Potassium 99 MG TABS Take 1 tablet by mouth daily.   Yes [provider]  spironolactone (ALDACTONE) 50 MG tablet Take 1 tablet  (50 mg total) by mouth daily. 12/08/18 01/07/19 Yes Shah, Pratik D, DO  torsemide (DEMADEX) 20 MG tablet Take 1 tablet (20 mg total) by mouth daily. 12/08/18 01/07/19 Yes Shah, Pratik D, DO  rifaximin (XIFAXAN) 550 MG TABS tablet Take 1 tablet (550 mg total) by mouth 2 (two) times daily. Patient not taking: Reported on 12/30/2018 12/08/18 01/07/19  Heath Lark D, DO     Family History  Problem Relation Age of Onset   Lung cancer Mother    Hypertension Father    Pancreatic cancer Brother    Liver disease Neg Hx     Social History   Socioeconomic History   Marital status: Married    Spouse name: Not on file   Number of children: Not on file   Years of education: Not on file   Highest education level: Not on file  Occupational History   Not on file  Social Needs   Financial resource strain: Not on file   Food insecurity    Worry: Not on file    Inability: Not on file   Transportation needs    Medical: Not on file    Non-medical: Not on file  Tobacco Use   Smoking status: Never Smoker   Smokeless tobacco: Never Used  Substance and Sexual Activity   Alcohol use: Never    Frequency: Never   Drug use: Never   Sexual activity: Not Currently  Lifestyle   Physical activity    Days per week: Not on file    Minutes per session: Not on file   Stress: Not on file  Relationships   Social connections    Talks on phone: Not on file    Gets together: Not on file    Attends religious service: Not on file    Active member of club or organization: Not on file    Attends meetings of clubs or organizations: Not on file    Relationship status: Not on file  Other Topics Concern   Not on file  Social History Narrative   Not on file     Review of Systems: A 12 point ROS discussed and pertinent positives are indicated in the HPI above.  All other systems are negative.  Review of Systems  Constitutional: Positive for appetite change and fatigue. Negative for  chills and fever.  Respiratory: Negative for cough and shortness of breath.   Cardiovascular: Negative for chest pain.  Gastrointestinal: Positive for nausea. Negative for abdominal pain, blood in stool, diarrhea and vomiting.  Genitourinary: Negative for dysuria and hematuria.  Musculoskeletal: Negative for back pain.  Skin: Negative for rash and wound.  Neurological: Positive for dizziness (If standing too fast). Negative for headaches.    Vital Signs: BP (!) 130/57    Pulse 74    Temp 98.2 F (36.8 C) (Skin)    Resp 14    Ht 5\' 3"  (1.6 m)    Wt 176 lb (79.8 kg)    SpO2 99%    BMI 31.18 kg/m   Physical  Exam Vitals signs reviewed.  Constitutional:      General: She is not in acute distress. HENT:     Head: Normocephalic.     Mouth/Throat:     Mouth: Mucous membranes are moist.     Pharynx: Oropharynx is clear. No oropharyngeal exudate or posterior oropharyngeal erythema.  Eyes:     General: No scleral icterus. Cardiovascular:     Rate and Rhythm: Normal rate and regular rhythm.  Pulmonary:     Effort: Pulmonary effort is normal.     Breath sounds: Normal breath sounds.  Abdominal:     General: Bowel sounds are normal. There is distension.     Palpations: Abdomen is soft.     Tenderness: There is no abdominal tenderness.  Skin:    General: Skin is warm and dry.     Coloration: Skin is not jaundiced.  Neurological:     Mental Status: She is alert and oriented to person, place, and time.  Psychiatric:        Mood and Affect: Mood normal.        Behavior: Behavior normal.        Thought Content: Thought content normal.        Judgment: Judgment normal.      MD Evaluation Airway: WNL Heart: WNL Abdomen: WNL Chest/ Lungs: WNL ASA  Classification: 3 Mallampati/Airway Score: One   Imaging: US Paracentesis  Result Date: 01/01/2019 INDICATION: Cirrhosis, ascites EXAM: ULTRASOUND GUIDED DIAGNOSTIC AND THERAPEUTIC PARACENTESIS MEDICATIONS: None COMPLICATIONS: None  immediate PROCEDURE: Informed written consent was obtained from the patient after a discussion of the risks, benefits and alternatives to treatment. A timeout was performed prior to the initiation of the procedure. Initial ultrasound scanning demonstrates a large amount of ascites within the right lower abdominal quadrant. The right lower abdomen was prepped and draped in the usual sterile fashion. 1% lidocaine was used for local anesthesia. Following this, a 5 [place] Yueh catheter was introduced. An ultrasound image was saved for documentation purposes. The paracentesis was performed. The catheter was removed and a dressing was applied. The patient tolerated the procedure well without immediate post procedural complication. Patient received post-procedure intravenous albumin; see nursing notes for details. FINDINGS: A total of approximately 4.2 L of amber colored ascitic fluid was removed. Samples were sent to the laboratory as requested by the clinical team. IMPRESSION: Successful ultrasound-guided paracentesis yielding 4.2 liters of peritoneal fluid. Electronically Signed   By: Lavonia Dana M.D.   On: 01/01/2019 13:31   US Paracentesis  Result Date: 12/22/2018 INDICATION: Cirrhosis, ascites EXAM: ULTRASOUND GUIDED THERAPEUTIC PARACENTESIS MEDICATIONS: None COMPLICATIONS: None immediate PROCEDURE: Informed written consent was obtained from the patient after a discussion of the risks, benefits and alternatives to treatment. A timeout was performed prior to the initiation of the procedure. Initial ultrasound scanning demonstrates a large amount of ascites within the right lower abdominal quadrant. The right lower abdomen was prepped and draped in the usual sterile fashion. 1% lidocaine was used for local anesthesia. Following this, a 7 cm length 5 [place] Yueh catheter was introduced. An ultrasound image was saved for documentation purposes. The paracentesis was performed. The catheter was removed and a dressing  was applied. The patient tolerated the procedure well without immediate post procedural complication. Patient received post-procedure intravenous albumin; see nursing notes for details. FINDINGS: A total of approximately 4 L of yellow ascitic fluid was removed. IMPRESSION: Successful ultrasound-guided paracentesis yielding 4 liters of peritoneal fluid. Electronically Signed   By: Elta [place]  Thornton Papas M.D.   On: 12/22/2018 12:06   US Paracentesis  Result Date: 12/07/2018 INDICATION: Cirrhosis and ascites. EXAM: ULTRASOUND GUIDED RIGHT ABDOMINAL PARACENTESIS MEDICATIONS: None. COMPLICATIONS: None immediate. PROCEDURE: Informed written consent was obtained from the patient after a discussion of the risks, benefits and alternatives to treatment. A timeout was performed prior to the initiation of the procedure. Initial ultrasound scanning demonstrates a large amount of ascites within the right lower abdominal quadrant. The right lower abdomen was prepped and draped in the usual sterile fashion. 1% lidocaine with epinephrine was used for local anesthesia. Following this, a 19 gauge, 7-cm, Yueh catheter was introduced. An ultrasound image was saved for documentation purposes. The paracentesis was performed. The catheter was removed and a dressing was applied. The patient tolerated the procedure well without immediate post procedural complication. FINDINGS: A total of approximately 4.0 L of serous fluid was removed. Samples were sent to the laboratory as requested by the clinical team. IMPRESSION: Successful ultrasound-guided paracentesis yielding 4.0 L liters of peritoneal fluid. Electronically Signed   By: Abigail Miyamoto M.D.   On: 12/07/2018 13:04    Labs:  CBC: Recent Labs    12/06/18 0456 12/07/18 0459 12/08/18 0457 01/04/19 1200  WBC 8.7 9.6 10.4 10.4  HGB 8.9* 8.3* 8.6* 11.2*  HCT 27.7* 25.0* 25.7* 32.1*  PLT 126* 128* 126* 203    COAGS: Recent Labs    05/05/18 0858  12/05/18 0508 12/06/18 0456  12/07/18 0459 12/08/18 0457  INR 1.34   < > 2.1* 2.3* 2.2* 2.2*  APTT 32  --   --   --   --   --    < > = values in this interval not displayed.    BMP: Recent Labs    12/05/18 0508 12/06/18 0456 12/07/18 0459 12/08/18 0457  NA 136 133* 128* 130*  K 4.0 4.0 3.6 3.8  CL 104 101 97* 99  CO2 25 24 21* 23  GLUCOSE 117* 108* 124* 115*  BUN 29* 30* 35* 37*  CALCIUM 8.4* 8.6* 8.5* 8.7*  CREATININE 1.36* 1.28* 1.39* 1.22*  GFRNONAA 38* 41* 37* 44*  GFRAA 44* 48* 43* 51*    LIVER FUNCTION TESTS: Recent Labs    12/05/18 0508 12/06/18 0456 12/07/18 0459 12/08/18 0457  BILITOT 2.9* 3.7* 3.7* 3.7*  AST 54* 45* 45* 34  ALT 27 21 24 18   ALKPHOS 80 67 64 57  PROT 5.9* 6.0* 6.2* 5.8*  ALBUMIN 2.2* 2.8* 3.1* 3.2*    TUMOR MARKERS: Recent Labs    11/20/18 0840  AFPTM 14.4*    Assessment and Plan:  73 y/o F with history of cirrhosis and recurrent ascites who presents today for a random liver biopsy.  Patient has been NPO since 6 am, she does not take blood thinning medications besides ASA 81 mg QD. Afebrile, WBC 10.4, hgb 11.2, plt 203, INR pending at time of this note writing.  Risks and benefits of random liver biopsy was discussed with the patient and/or patient's family including, but not limited to bleeding, infection, damage to adjacent structures or low yield requiring additional tests.  All of the questions were answered and there is agreement to proceed.  Consent signed and in chart.   Thank you for this interesting consult.  I greatly enjoyed meeting Jordan Le and look forward to participating in their care.  A copy of this report was sent to the requesting provider on this date.  Electronically Signed: Joaquim Nam, PA-C 01/04/2019, 12:42  PM   I spent a total of  15 Minutes in face to face in clinical consultation, greater than 50% of which was counseling/coordinating care for random liver biopsy.

## 2019-01-05 DIAGNOSIS — E1129 Type 2 diabetes mellitus with other diabetic kidney complication: Secondary | ICD-10-CM | POA: Diagnosis not present

## 2019-01-05 DIAGNOSIS — I10 Essential (primary) hypertension: Secondary | ICD-10-CM | POA: Diagnosis not present

## 2019-01-05 DIAGNOSIS — Z6832 Body mass index (BMI) 32.0-32.9, adult: Secondary | ICD-10-CM | POA: Diagnosis not present

## 2019-01-05 DIAGNOSIS — K7290 Hepatic failure, unspecified without coma: Secondary | ICD-10-CM | POA: Diagnosis not present

## 2019-01-05 DIAGNOSIS — K7460 Unspecified cirrhosis of liver: Secondary | ICD-10-CM | POA: Diagnosis not present

## 2019-01-05 DIAGNOSIS — N898 Other specified noninflammatory disorders of vagina: Secondary | ICD-10-CM | POA: Insufficient documentation

## 2019-01-05 DIAGNOSIS — E039 Hypothyroidism, unspecified: Secondary | ICD-10-CM | POA: Diagnosis not present

## 2019-01-05 NOTE — Assessment & Plan Note (Signed)
The patient has what appears to be rapid onset and decompensation of significant cirrhosis, query chemotherapy versus [place] in etiology.  She has hypertensive portal gastropathy, need for ongoing paracentesis, hepatic encephalopathy.  She did have acute kidney injury related to her diuretics and Lasix was switched out for torsemide which is gentler on her kidneys.  She is unable to afford Xifaxan.  Today, given her worsening abdominal swelling, I will plan for paracentesis with labs and albumin.  I have reached out to the liver clinic for clarification on possible supplements.  Return for follow-up in 2 months.  Overall prognosis seems poor.  She is not a candidate for liver transplant.  Hospice may need to be a consideration in the near future.

## 2019-01-05 NOTE — Assessment & Plan Note (Signed)
The patient was concerned about a possible peroneal growth between her rectum and her vaginal opening.  She asked me to examine this for any possible explanation.  I did a rectal exam as per her note.  She does have what appears to be some external hemorrhoids or rectal varices.  However, the area of concern appears to be emanating from her vagina.  Query prolapsed bladder or prolapsed vaginal wall.  I have recommended that she reach out to her gynecologist or primary care to schedule an ASAP office visit to evaluate.

## 2019-01-06 ENCOUNTER — Telehealth: Payer: Self-pay

## 2019-01-06 LAB — CULTURE, BODY FLUID W GRAM STAIN -BOTTLE: Culture: NO GROWTH

## 2019-01-06 NOTE — Telephone Encounter (Signed)
I called pt's insurance and spoke to [place]. She told me to have Dr. Oneida Alar send the prescription to the pharmacy in her name and then let the pharmacist run it and fax me the needed info. She said we possibly could do a PA but more likely a [place] Reduction.

## 2019-01-06 NOTE — Telephone Encounter (Signed)
I called [facility] and spoke to pharmacist, Mel Almond to check on Xifaxan. She said the cost of the drug is $2500.00 and her copay is little over $800.00. PA cannot be done on this. She is faxing pt's drug insurance info to me so I can call them.

## 2019-01-07 ENCOUNTER — Encounter: Payer: Self-pay | Admitting: Nurse Practitioner

## 2019-01-07 ENCOUNTER — Telehealth: Payer: Self-pay | Admitting: Gastroenterology

## 2019-01-07 ENCOUNTER — Telehealth: Payer: Self-pay | Admitting: Nurse Practitioner

## 2019-01-07 DIAGNOSIS — K7200 Acute and subacute hepatic failure without coma: Secondary | ICD-10-CM

## 2019-01-07 DIAGNOSIS — K7460 Unspecified cirrhosis of liver: Secondary | ICD-10-CM

## 2019-01-07 DIAGNOSIS — K7682 Hepatic encephalopathy: Secondary | ICD-10-CM

## 2019-01-07 MED ORDER — LEVOCARNITINE (DIETARY) 330 MG PO TABS: 330.0000 mg | ORAL_TABLET | Freq: Three times a day (TID) | ORAL | 5 refills | Status: DC

## 2019-01-07 MED ORDER — RIFAXIMIN 550 MG PO TABS: 550.0000 mg | ORAL_TABLET | Freq: Two times a day (BID) | ORAL | 5 refills | Status: DC

## 2019-01-07 MED ORDER — ZINC SULFATE 220 (50 ZN) MG PO CAPS: 220.0000 mg | ORAL_CAPSULE | Freq: Three times a day (TID) | ORAL | 5 refills | Status: AC

## 2019-01-07 MED ORDER — SENNA-DOCUSATE SODIUM 8.6-50 MG PO TABS: ORAL_TABLET | ORAL | 11 refills | Status: DC

## 2019-01-07 NOTE — Telephone Encounter (Signed)
REVIEWED-NO ADDITIONAL RECOMMENDATIONS. 

## 2019-01-07 NOTE — Telephone Encounter (Signed)
DAUGHTER CALLED TO DISCUSS MOTHER. WANTS TO KNOW ABOUT SUPPLEMENTS AND SOMETHING FOR CONSTIPATION. REVIEWED RECOMMENDATION FROM HEPATOLOGY. REVIEWED OPTIONS FOR MANAGING ASCITES TO INCLUDE PALLIATIVE CATHETER AND HOW IT AFFECTS PROTEIN.  RX SENT FOR PERICOLACE.

## 2019-01-07 NOTE — Telephone Encounter (Signed)
Spoke with the patient's daughter. Explained the recommended supplements as recommended by [facility] in [place] (as outlined below). Will send in Rx for Zinc and Levocarnitine to see about insurance reimbursement. Family will investigate BCAAs and LOLA as OTC products. Also answered some general questions, reviewed labs per patient request.  BCAAs An updated meta-analysis of eight randomized, controlled trials (RCTs) indicated that oral BCAA-enriched formulations improve the manifestations of episodic HE whether OHE or MHE.102, 130   L-ornithine L-aspartate (LOLA) An RCT on patients with persistent HE demonstrated improvement by IV LOLA in psychometric testing and postprandial venous ammonia levels.105 Oral supplementation with LOLA is ineffective. It doesn't hurt to take it and may help with protein transport and sarcopenia.  There is now a [employer] it, [place], but very expensive through them.    Specific micronutrient replacement is given if there are confirmed measured losses, and zinc supplementation is considered when treating HE. If Wernicke's is suspected, large doses of thiamine should be given parenterally and before any glucose administration.  Zinc:  220mg  TID Levocarnitine 330mg  TID  (limited data showing benefit, can help with overall protein transport, sarcopenia,  also helps with leg cramps from diuretics)  BCAA and LOLA are OTC.  Can get them through [facility] and available at most drug stores now.

## 2019-01-07 NOTE — Telephone Encounter (Signed)
Rx SENT FOR Jordan Le. AWAITING PA REQUEST FROM PHARMACY.

## 2019-01-07 NOTE — Telephone Encounter (Addendum)
PLEASE CALL PT'S PHARMACY. THE NEW RX FOR XIFAXAN HAS BEEN SENT.

## 2019-01-07 NOTE — Progress Notes (Signed)
Sent letter to patient about recommended supplements.

## 2019-01-07 NOTE — Telephone Encounter (Signed)
Discussed supplement recommendations with daughter. Also sent the information in a letter by mail to her home for their records. Rx for 2 of the supplements were sent in; the others are OTC.

## 2019-01-07 NOTE — Addendum Note (Signed)
Addended by: Danie Binder on: 01/07/2019 04:01 PM   Modules accepted: Orders

## 2019-01-08 DIAGNOSIS — E119 Type 2 diabetes mellitus without complications: Secondary | ICD-10-CM | POA: Diagnosis not present

## 2019-01-08 DIAGNOSIS — K7290 Hepatic failure, unspecified without coma: Secondary | ICD-10-CM | POA: Diagnosis not present

## 2019-01-08 DIAGNOSIS — R188 Other ascites: Secondary | ICD-10-CM | POA: Diagnosis not present

## 2019-01-08 NOTE — Telephone Encounter (Signed)
Pharmacy aware

## 2019-01-11 ENCOUNTER — Other Ambulatory Visit: Payer: Self-pay

## 2019-01-11 DIAGNOSIS — K7460 Unspecified cirrhosis of liver: Secondary | ICD-10-CM

## 2019-01-11 NOTE — Telephone Encounter (Signed)
Called [employer] (spoke to Armorel), he said 1st available for [facility] was 01/21/19 at 9:00am.   Called [place] at [facility] and spoke to [place]. He advised pt can have para 01/13/19 at [place] (spoke to Manuela Schwartz), para rescheduled to 01/13/19 at 9:00am, arrive at 8:45am.   Called and informed daughter [name]) of para appt.  Standing para order faxed to [employer].

## 2019-01-11 NOTE — Telephone Encounter (Signed)
I called the patient's daughter and informed her of the standing order and that in the future she'll just need to call radiology to schedule.   Please have someone ([place] vs radiology) call the patient with the time for her para this time.

## 2019-01-11 NOTE — Telephone Encounter (Signed)
At this point she has been requiring para about every 7-14 days. I will set her up with standing orders.  MB: Lets initiate standing para orders. Labs: Cell count, gram stain, culture. Albumin: per protocol. Max limit: none specified, based on VS and radiologist/PA clinical judgement.

## 2019-01-11 NOTE — Telephone Encounter (Signed)
Patient's daughter, [name] called in stating her mother's abd is swollen and she thinks she needs to have a Para done.  [name] can be reached at [phone].  Routing to [employer] for advice.

## 2019-01-13 ENCOUNTER — Ambulatory Visit (HOSPITAL_COMMUNITY)
Admission: RE | Admit: 2019-01-13 | Discharge: 2019-01-13 | Disposition: A | Discharge status: Home / Self Care | Payer: PPO | Source: Ambulatory Visit | Attending: Nurse Practitioner | Admitting: Nurse Practitioner

## 2019-01-13 ENCOUNTER — Encounter (HOSPITAL_COMMUNITY): Payer: Self-pay

## 2019-01-13 ENCOUNTER — Other Ambulatory Visit: Payer: Self-pay

## 2019-01-13 DIAGNOSIS — K7460 Unspecified cirrhosis of liver: Secondary | ICD-10-CM | POA: Insufficient documentation

## 2019-01-13 DIAGNOSIS — R188 Other ascites: Secondary | ICD-10-CM | POA: Diagnosis not present

## 2019-01-13 LAB — BODY FLUID CELL COUNT WITH DIFFERENTIAL
Eos, Fluid: 2 %
Lymphs, Fluid: 51 %
Monocyte-Macrophage-Serous Fluid: 15 % — ABNORMAL LOW (ref 50–90)
Neutrophil Count, Fluid: 32 % — ABNORMAL HIGH (ref 0–25)
Other Cells, Fluid: 1 %
Total Nucleated Cell Count, Fluid: 305 cu mm (ref 0–1000)

## 2019-01-13 LAB — GRAM STAIN: Gram Stain: NONE SEEN

## 2019-01-13 LAB — PATHOLOGIST SMEAR REVIEW

## 2019-01-13 MED ORDER — ALBUMIN HUMAN 25 % IV SOLN
50.0000 g | Freq: Once | INTRAVENOUS | Status: AC
  Administered 2019-01-13: 10:00:00 50 g via INTRAVENOUS

## 2019-01-13 MED ORDER — ALBUMIN HUMAN 25 % IV SOLN
INTRAVENOUS | Status: AC
  Administered 2019-01-13: 50 g via INTRAVENOUS
  Filled 2019-01-13: qty 200

## 2019-01-13 NOTE — Procedures (Signed)
  US guided RLQ paracentesis  6.2 L reddish fluid obtained Sent for labs per MD  IV Albumin per protocol  Tolerated well

## 2019-01-13 NOTE — Progress Notes (Signed)
Paracentesis complete no signs of distress. 50G albumin given

## 2019-01-14 DIAGNOSIS — Z6829 Body mass index (BMI) 29.0-29.9, adult: Secondary | ICD-10-CM | POA: Diagnosis not present

## 2019-01-14 DIAGNOSIS — N814 Uterovaginal prolapse, unspecified: Secondary | ICD-10-CM | POA: Diagnosis not present

## 2019-01-18 LAB — CULTURE, BODY FLUID W GRAM STAIN -BOTTLE: Culture: NO GROWTH

## 2019-01-20 DIAGNOSIS — D631 Anemia in chronic kidney disease: Secondary | ICD-10-CM | POA: Diagnosis not present

## 2019-01-20 DIAGNOSIS — N184 Chronic kidney disease, stage 4 (severe): Secondary | ICD-10-CM | POA: Diagnosis not present

## 2019-01-20 DIAGNOSIS — I129 Hypertensive chronic kidney disease with stage 1 through stage 4 chronic kidney disease, or unspecified chronic kidney disease: Secondary | ICD-10-CM | POA: Diagnosis not present

## 2019-01-20 DIAGNOSIS — R188 Other ascites: Secondary | ICD-10-CM | POA: Diagnosis not present

## 2019-01-20 DIAGNOSIS — K7460 Unspecified cirrhosis of liver: Secondary | ICD-10-CM | POA: Diagnosis not present

## 2019-01-20 DIAGNOSIS — N179 Acute kidney failure, unspecified: Secondary | ICD-10-CM | POA: Diagnosis not present

## 2019-01-21 ENCOUNTER — Ambulatory Visit (HOSPITAL_COMMUNITY): Payer: PPO

## 2019-01-21 DIAGNOSIS — K7290 Hepatic failure, unspecified without coma: Secondary | ICD-10-CM | POA: Diagnosis not present

## 2019-01-21 DIAGNOSIS — R188 Other ascites: Secondary | ICD-10-CM | POA: Diagnosis not present

## 2019-01-21 DIAGNOSIS — E119 Type 2 diabetes mellitus without complications: Secondary | ICD-10-CM | POA: Diagnosis not present

## 2019-01-22 DIAGNOSIS — I10 Essential (primary) hypertension: Secondary | ICD-10-CM | POA: Diagnosis not present

## 2019-01-22 DIAGNOSIS — E782 Mixed hyperlipidemia: Secondary | ICD-10-CM | POA: Diagnosis not present

## 2019-01-25 ENCOUNTER — Other Ambulatory Visit: Payer: Self-pay | Admitting: *Deleted

## 2019-01-25 ENCOUNTER — Other Ambulatory Visit: Payer: Self-pay

## 2019-01-25 ENCOUNTER — Encounter (HOSPITAL_COMMUNITY): Payer: Self-pay | Admitting: Emergency Medicine

## 2019-01-25 ENCOUNTER — Other Ambulatory Visit: Payer: Self-pay | Admitting: Nurse Practitioner

## 2019-01-25 ENCOUNTER — Telehealth: Payer: Self-pay | Admitting: Gastroenterology

## 2019-01-25 ENCOUNTER — Inpatient Hospital Stay (HOSPITAL_COMMUNITY)
Admission: EM | Admit: 2019-01-25 | Discharge: 2019-01-30 | DRG: 682 | Disposition: A | Discharge status: Hospice | Payer: PPO | Attending: Family Medicine | Admitting: Family Medicine

## 2019-01-25 DIAGNOSIS — D638 Anemia in other chronic diseases classified elsewhere: Secondary | ICD-10-CM | POA: Diagnosis present

## 2019-01-25 DIAGNOSIS — Z7189 Other specified counseling: Secondary | ICD-10-CM

## 2019-01-25 DIAGNOSIS — N179 Acute kidney failure, unspecified: Secondary | ICD-10-CM | POA: Diagnosis present

## 2019-01-25 DIAGNOSIS — Z20828 Contact with and (suspected) exposure to other viral communicable diseases: Secondary | ICD-10-CM | POA: Diagnosis present

## 2019-01-25 DIAGNOSIS — I10 Essential (primary) hypertension: Secondary | ICD-10-CM | POA: Diagnosis present

## 2019-01-25 DIAGNOSIS — K2980 Duodenitis without bleeding: Secondary | ICD-10-CM | POA: Diagnosis present

## 2019-01-25 DIAGNOSIS — E039 Hypothyroidism, unspecified: Secondary | ICD-10-CM | POA: Diagnosis present

## 2019-01-25 DIAGNOSIS — N183 Chronic kidney disease, stage 3 unspecified: Secondary | ICD-10-CM | POA: Diagnosis present

## 2019-01-25 DIAGNOSIS — G9340 Encephalopathy, unspecified: Secondary | ICD-10-CM | POA: Diagnosis not present

## 2019-01-25 DIAGNOSIS — D649 Anemia, unspecified: Secondary | ICD-10-CM | POA: Diagnosis present

## 2019-01-25 DIAGNOSIS — K7460 Unspecified cirrhosis of liver: Secondary | ICD-10-CM | POA: Diagnosis present

## 2019-01-25 DIAGNOSIS — K2970 Gastritis, unspecified, without bleeding: Secondary | ICD-10-CM | POA: Diagnosis present

## 2019-01-25 DIAGNOSIS — E1122 Type 2 diabetes mellitus with diabetic chronic kidney disease: Secondary | ICD-10-CM | POA: Diagnosis present

## 2019-01-25 DIAGNOSIS — K7581 Nonalcoholic steatohepatitis (NASH): Secondary | ICD-10-CM | POA: Diagnosis present

## 2019-01-25 DIAGNOSIS — E785 Hyperlipidemia, unspecified: Secondary | ICD-10-CM | POA: Diagnosis present

## 2019-01-25 DIAGNOSIS — K7200 Acute and subacute hepatic failure without coma: Secondary | ICD-10-CM | POA: Diagnosis present

## 2019-01-25 DIAGNOSIS — R188 Other ascites: Secondary | ICD-10-CM

## 2019-01-25 DIAGNOSIS — T502X5A Adverse effect of carbonic-anhydrase inhibitors, benzothiadiazides and other diuretics, initial encounter: Secondary | ICD-10-CM | POA: Diagnosis present

## 2019-01-25 DIAGNOSIS — Z79890 Hormone replacement therapy (postmenopausal): Secondary | ICD-10-CM

## 2019-01-25 DIAGNOSIS — K3189 Other diseases of stomach and duodenum: Secondary | ICD-10-CM | POA: Diagnosis present

## 2019-01-25 DIAGNOSIS — Z79899 Other long term (current) drug therapy: Secondary | ICD-10-CM

## 2019-01-25 DIAGNOSIS — E871 Hypo-osmolality and hyponatremia: Secondary | ICD-10-CM | POA: Diagnosis present

## 2019-01-25 DIAGNOSIS — Z8249 Family history of ischemic heart disease and other diseases of the circulatory system: Secondary | ICD-10-CM

## 2019-01-25 DIAGNOSIS — Z66 Do not resuscitate: Secondary | ICD-10-CM | POA: Diagnosis not present

## 2019-01-25 DIAGNOSIS — I129 Hypertensive chronic kidney disease with stage 1 through stage 4 chronic kidney disease, or unspecified chronic kidney disease: Secondary | ICD-10-CM | POA: Diagnosis present

## 2019-01-25 DIAGNOSIS — K7210 Chronic hepatic failure without coma: Secondary | ICD-10-CM | POA: Diagnosis present

## 2019-01-25 DIAGNOSIS — K7682 Hepatic encephalopathy: Secondary | ICD-10-CM | POA: Diagnosis present

## 2019-01-25 DIAGNOSIS — Z634 Disappearance and death of family member: Secondary | ICD-10-CM | POA: Diagnosis not present

## 2019-01-25 DIAGNOSIS — Z8673 Personal history of transient ischemic attack (TIA), and cerebral infarction without residual deficits: Secondary | ICD-10-CM

## 2019-01-25 DIAGNOSIS — Z8542 Personal history of malignant neoplasm of other parts of uterus: Secondary | ICD-10-CM

## 2019-01-25 DIAGNOSIS — E119 Type 2 diabetes mellitus without complications: Secondary | ICD-10-CM

## 2019-01-25 DIAGNOSIS — E7800 Pure hypercholesterolemia, unspecified: Secondary | ICD-10-CM | POA: Diagnosis present

## 2019-01-25 DIAGNOSIS — Z515 Encounter for palliative care: Secondary | ICD-10-CM | POA: Diagnosis not present

## 2019-01-25 DIAGNOSIS — R1084 Generalized abdominal pain: Secondary | ICD-10-CM | POA: Diagnosis not present

## 2019-01-25 DIAGNOSIS — R52 Pain, unspecified: Secondary | ICD-10-CM | POA: Diagnosis not present

## 2019-01-25 DIAGNOSIS — Z800 Family history of malignant neoplasm of digestive organs: Secondary | ICD-10-CM

## 2019-01-25 DIAGNOSIS — R000 Tachycardia, unspecified: Secondary | ICD-10-CM | POA: Diagnosis not present

## 2019-01-25 DIAGNOSIS — I471 Supraventricular tachycardia: Secondary | ICD-10-CM | POA: Diagnosis present

## 2019-01-25 DIAGNOSIS — Z9114 Patient's other noncompliance with medication regimen: Secondary | ICD-10-CM

## 2019-01-25 DIAGNOSIS — Z801 Family history of malignant neoplasm of trachea, bronchus and lung: Secondary | ICD-10-CM

## 2019-01-25 DIAGNOSIS — R0689 Other abnormalities of breathing: Secondary | ICD-10-CM | POA: Diagnosis not present

## 2019-01-25 DIAGNOSIS — K7290 Hepatic failure, unspecified without coma: Secondary | ICD-10-CM | POA: Diagnosis present

## 2019-01-25 LAB — COMPREHENSIVE METABOLIC PANEL
ALT: 55 U/L — ABNORMAL HIGH (ref 0–44)
AST: 76 U/L — ABNORMAL HIGH (ref 15–41)
Albumin: 2.4 g/dL — ABNORMAL LOW (ref 3.5–5.0)
Alkaline Phosphatase: 135 U/L — ABNORMAL HIGH (ref 38–126)
Anion gap: 13 (ref 5–15)
BUN: 43 mg/dL — ABNORMAL HIGH (ref 8–23)
CO2: 20 mmol/L — ABNORMAL LOW (ref 22–32)
Calcium: 8.5 mg/dL — ABNORMAL LOW (ref 8.9–10.3)
Chloride: 92 mmol/L — ABNORMAL LOW (ref 98–111)
Creatinine, Ser: 1.94 mg/dL — ABNORMAL HIGH (ref 0.44–1.00)
GFR calc Af Amer: 29 mL/min — ABNORMAL LOW (ref >60)
GFR calc non Af Amer: 25 mL/min — ABNORMAL LOW (ref >60)
Glucose, Bld: 182 mg/dL — ABNORMAL HIGH (ref 70–99)
Potassium: 5.1 mmol/L (ref 3.5–5.1)
Sodium: 125 mmol/L — ABNORMAL LOW (ref 135–145)
Total Bilirubin: 3.4 mg/dL — ABNORMAL HIGH (ref 0.3–1.2)
Total Protein: 6 g/dL — ABNORMAL LOW (ref 6.5–8.1)

## 2019-01-25 LAB — CBC WITH DIFFERENTIAL/PLATELET
Abs Immature Granulocytes: 0.1 10*3/uL — ABNORMAL HIGH (ref 0.00–0.07)
Basophils Absolute: 0.1 10*3/uL (ref 0.0–0.1)
Basophils Relative: 1 %
Eosinophils Absolute: 0.1 10*3/uL (ref 0.0–0.5)
Eosinophils Relative: 1 %
HCT: 28.9 % — ABNORMAL LOW (ref 36.0–46.0)
Hemoglobin: 9.7 g/dL — ABNORMAL LOW (ref 12.0–15.0)
Immature Granulocytes: 1 %
Lymphocytes Relative: 7 %
Lymphs Abs: 0.8 10*3/uL (ref 0.7–4.0)
MCH: 31.6 pg (ref 26.0–34.0)
MCHC: 33.6 g/dL (ref 30.0–36.0)
MCV: 94.1 fL (ref 80.0–100.0)
Monocytes Absolute: 1.2 10*3/uL — ABNORMAL HIGH (ref 0.1–1.0)
Monocytes Relative: 10 %
Neutro Abs: 9.7 10*3/uL — ABNORMAL HIGH (ref 1.7–7.7)
Neutrophils Relative %: 80 %
Platelets: 219 10*3/uL (ref 150–400)
RBC: 3.07 MIL/uL — ABNORMAL LOW (ref 3.87–5.11)
RDW: 16.2 % — ABNORMAL HIGH (ref 11.5–15.5)
WBC: 11.9 10*3/uL — ABNORMAL HIGH (ref 4.0–10.5)
nRBC: 0 % (ref 0.0–0.2)

## 2019-01-25 LAB — PROTIME-INR
INR: 1.9 — ABNORMAL HIGH (ref 0.8–1.2)
Prothrombin Time: 21.5 seconds — ABNORMAL HIGH (ref 11.4–15.2)

## 2019-01-25 LAB — URINALYSIS, ROUTINE W REFLEX MICROSCOPIC
Bilirubin Urine: NEGATIVE
Glucose, UA: NEGATIVE mg/dL
Hgb urine dipstick: NEGATIVE
Ketones, ur: NEGATIVE mg/dL
Leukocytes,Ua: NEGATIVE
Nitrite: NEGATIVE
Protein, ur: NEGATIVE mg/dL
Specific Gravity, Urine: 1.012 (ref 1.005–1.030)
pH: 5 (ref 5.0–8.0)

## 2019-01-25 LAB — CBG MONITORING, ED: Glucose-Capillary: 172 mg/dL — ABNORMAL HIGH (ref 70–99)

## 2019-01-25 LAB — BRAIN NATRIURETIC PEPTIDE: B Natriuretic Peptide: 48 pg/mL (ref 0.0–100.0)

## 2019-01-25 LAB — AMMONIA: Ammonia: 131 umol/L — ABNORMAL HIGH (ref 9–35)

## 2019-01-25 MED ORDER — LACTULOSE 10 GM/15ML PO SOLN
30.0000 g | Freq: Three times a day (TID) | ORAL | Status: DC
  Administered 2019-01-26 – 2019-01-27 (×7): 30 g via ORAL
  Filled 2019-01-25 (×8): qty 60

## 2019-01-25 MED ORDER — SODIUM CHLORIDE 0.9 % IV BOLUS
1000.0000 mL | Freq: Once | INTRAVENOUS | Status: AC
  Administered 2019-01-26: 1000 mL via INTRAVENOUS

## 2019-01-25 MED ORDER — SODIUM CHLORIDE 0.9 % IV SOLN
INTRAVENOUS | Status: DC
  Administered 2019-01-25: 23:00:00 via INTRAVENOUS

## 2019-01-25 NOTE — ED Triage Notes (Signed)
Pt from home via [facility]. Pt C/O weakness, nausea, and abdominal swelling. Pt states she is due to have fluid drawn from her abdomen on Wednesday.

## 2019-01-25 NOTE — Telephone Encounter (Signed)
PATIENT DAUGHTER CALLED STATING PATIENT NEEDS A PARA, THEY CALLED CENTRAL SCHEDULING BUT WERE TOLD THEY DID NOT HAVE A STANDING ORDER

## 2019-01-25 NOTE — ED Provider Notes (Signed)
[facility] EMERGENCY DEPARTMENT Provider Note   CSN: [id number] Arrival date & time: 01/25/19  2055     History   Chief Complaint Chief Complaint  Patient presents with   Weakness    HPI Jordan Le is a 73 y.o. female.     HPI  The patient is a 73 year old female, she has a known history of endometrial cancer, she has cirrhosis of the liver as well as type 2 diabetes.  She presents to the hospital today with a couple of complaints, primarily nausea and vomiting that started approximately 8 hours ago, this has been persistent throughout the day, she is severely weak, she has not been taking her medications including her lactulose and last bowel movement was yesterday.  She does not have a headache, she feels diffusely weak and fatigued.  She endorses increasing abdominal swelling but not out of the norm compared to prior swelling.  She is scheduled for a paracentesis on Wednesday.  She is able to give me all of this information.  The paramedics have brought her in, no medications given prehospital.  Symptoms are persistent, gradually worsening, nobody has been sick at home.  She does report some increasing shortness of breath and cough today  Past Medical History:  Diagnosis Date   Arthritis    Cancer ([place])    endometrial cancer   Cirrhosis of liver ([place])    Diabetes mellitus without complication ([place])    type 2   High cholesterol    Hypertension    Mini stroke ([place])    2011   Pneumonia    Renal disorder     Patient Active Problem List   Diagnosis Date Noted   Vaginal lesion 01/05/2019   Ascites    Hepatic encephalopathy (HCC)    Lactic acidosis    Dehydration    Acute hepatic encephalopathy 12/03/2018   Hepatitis B vaccination not up to date 11/16/2018   SVT (supraventricular tachycardia) ([place]) 10/27/2018   AKI (acute kidney injury) ([place])    Anasarca    Pleural effusion on left    Acute renal failure (ARF) ([place]) 10/25/2018    Essential hypertension 10/25/2018   SOB (shortness of breath) 10/25/2018   Acute lower UTI 10/25/2018   Diabetes mellitus without complication ([place])    Cirrhosis of liver with ascites ([place])    High cholesterol    Mini stroke ([place])    Endometrial cancer ([place]) 05/07/2018   Urinary tract infection 05/07/2018    Past Surgical History:  Procedure Laterality Date   ABDOMINAL HYSTERECTOMY     BIOPSY  11/23/2018   Procedure: BIOPSY;  Surgeon: Danie Binder, MD;  [facility];  Service: Endoscopy;;   BREAST BIOPSY Right    ~15 years ago-2005   ESOPHAGOGASTRODUODENOSCOPY N/A 11/23/2018   Procedure: ESOPHAGOGASTRODUODENOSCOPY (EGD);  Surgeon: Danie Binder, MD;  Location: [facility];  Service: Endoscopy;  Laterality: N/A;  1:00pm   OTHER SURGICAL HISTORY  2009   Uterine Polyp removal   polyps removed from the uterus     ROBOTIC ASSISTED TOTAL HYSTERECTOMY WITH BILATERAL SALPINGO OOPHERECTOMY Bilateral 05/07/2018   Procedure: XI ROBOTIC ASSISTED TOTAL HYSTERECTOMY WITH BILATERAL SALPINGO OOPHORECTOMY PELVIC LYMPHANECTOMY;  Surgeon: Everitt Amber, MD;  Location: [name];  Service: Gynecology;  Laterality: Bilateral;   SENTINEL NODE BIOPSY Bilateral 05/07/2018   Procedure: SENTINEL NODE BIOPSY;  Surgeon: Everitt Amber, MD;  Location: [name];  Service: Gynecology;  Laterality: Bilateral;     OB History  Gravida  2   Para  2   Term  2   Preterm      AB      Living        SAB      TAB      Ectopic      Multiple      Live Births               Home Medications    Prior to Admission medications   Medication Sig Start Date End Date Taking? Authorizing Provider  atorvastatin (LIPITOR) 10 MG tablet Take 1 tablet (10 mg total) by mouth daily at 6 PM. 11/05/18   Emokpae, Courage, MD  diltiazem (CARDIZEM CD) 120 MG 24 hr capsule Take 1 capsule (120 mg total) by mouth daily. 11/05/18 11/05/19  Roxan Hockey, MD  docusate sodium (COLACE) 100 MG capsule  Take 1 capsule (100 mg total) by mouth daily. 12/08/18   Manuella Ghazi, Pratik D, DO  feeding supplement, ENSURE ENLIVE, (ENSURE ENLIVE) LIQD Take 237 mLs by mouth 2 (two) times daily between meals. Patient taking differently: Take 237 mLs by mouth daily at 2 PM.  11/05/18   Emokpae, Courage, MD  lactulose (CHRONULAC) 10 GM/15ML solution Take 30 mLs (20 g total) by mouth 3 (three) times daily. Patient taking differently: Take 20 g by mouth 2 (two) times daily as needed for moderate constipation.  12/08/18   Manuella Ghazi, Pratik D, DO  levOCARNitine, Dietary, 330 MG TABS Take 330 mg by mouth 3 (three) times daily. 01/07/19   Carlis Stable, NP  levothyroxine (SYNTHROID) 75 MCG tablet Take 75 mcg by mouth daily before breakfast.  10/05/18 10/30/19  [provider]  Multiple Vitamin (MULTIVITAMIN WITH MINERALS) TABS tablet Take 1 tablet by mouth daily. 11/06/18   Roxan Hockey, MD  nystatin (MYCOSTATIN/NYSTOP) powder Apply topically 3 (three) times daily. Patient taking differently: Apply 1 g topically daily at 2 PM.  11/05/18   Emokpae, Courage, MD  omeprazole (PRILOSEC) 20 MG capsule 1 PO 30 MINS PRIOR TO BREAKFAST. Patient taking differently: Take 20 mg by mouth daily before breakfast. 1 PO 30 MINS PRIOR TO BREAKFAST. 11/23/18   Fields, Marga Melnick, MD  Potassium 99 MG TABS Take 1 tablet by mouth daily.    [provider]  rifaximin (XIFAXAN) 550 MG TABS tablet Take 1 tablet (550 mg total) by mouth 2 (two) times daily. 01/07/19 02/06/19  Danie Binder, MD  sennosides-docusate sodium (SENOKOT-S) 8.6-50 MG tablet 1 PO BID 01/07/19   Fields, Marga Melnick, MD  spironolactone (ALDACTONE) 50 MG tablet Take 1 tablet (50 mg total) by mouth daily. 12/08/18 01/07/19  Manuella Ghazi, Pratik D, DO  torsemide (DEMADEX) 20 MG tablet Take 1 tablet (20 mg total) by mouth daily. 12/08/18 01/07/19  Manuella Ghazi, Pratik D, DO  zinc sulfate 220 (50 Zn) MG capsule Take 1 capsule (220 mg total) by mouth 3 (three) times daily. 01/07/19   Carlis Stable, NP      Family History Family History  Problem Relation Age of Onset   Lung cancer Mother    Hypertension Father    Pancreatic cancer Brother    Liver disease Neg Hx     Social History Social History   Tobacco Use   Smoking status: Never Smoker   Smokeless tobacco: Never Used  Substance Use Topics   Alcohol use: Never    Frequency: Never   Drug use: Never     Allergies   Patient has no known  allergies.   Review of Systems Review of Systems  All other systems reviewed and are negative.    Physical Exam Updated Vital Signs BP (!) 118/50 (BP Location: Right Arm)    Pulse 98    Temp 98.3 F (36.8 C) (Oral)    Resp 18    Wt 83.5 kg    SpO2 98%    BMI 32.59 kg/m   Physical Exam Vitals signs and nursing note reviewed.  Constitutional:      General: She is not in acute distress.    Appearance: She is well-developed.  HENT:     Head: Normocephalic and atraumatic.     Mouth/Throat:     Mouth: Mucous membranes are dry.     Pharynx: No oropharyngeal exudate.     Comments: Very dry mucous membranes Eyes:     General: No scleral icterus.       Right eye: No discharge.        Left eye: No discharge.     Conjunctiva/sclera: Conjunctivae normal.     Pupils: Pupils are equal, round, and reactive to light.  Neck:     Musculoskeletal: Normal range of motion and neck supple.     Thyroid: No thyromegaly.     Vascular: No JVD.  Cardiovascular:     Rate and Rhythm: Normal rate and regular rhythm.     Heart sounds: Normal heart sounds. No murmur. No friction rub. No gallop.   Pulmonary:     Effort: Pulmonary effort is normal. No respiratory distress.     Breath sounds: Normal breath sounds. No wheezing or rales.  Abdominal:     General: Bowel sounds are normal. There is distension.     Palpations: Abdomen is soft. There is no mass.     Tenderness: There is no abdominal tenderness.     Comments: Massively distended abdomen, dullness to percussion diffusely with a  fluid wave but no tenderness  Musculoskeletal: Normal range of motion.        General: No tenderness.     Right lower leg: Edema present.     Left lower leg: Edema present.     Comments: Scant bilateral lower extremity edema symmetrical  Lymphadenopathy:     Cervical: No cervical adenopathy.  Skin:    General: Skin is warm and dry.     Findings: No erythema or rash.  Neurological:     Mental Status: She is alert.     Coordination: Coordination normal.     Comments: Severely weak but able to help sit up in bed, able to follow all my commands but with significant weakness, 4 out of 5 strength diffusely, no cranial nerve deficits 3 through 12  Psychiatric:        Behavior: Behavior normal.      ED Treatments / Results  Labs (all labs ordered are listed, but only abnormal results are displayed) Labs Reviewed - No data to display  EKG None  Radiology No results found.  Procedures .Critical Care Performed by: Noemi Chapel, MD Authorized by: Noemi Chapel, MD   Critical care provider statement:    Critical care time (minutes):  35   Critical care time was exclusive of:  Separately billable procedures and treating other patients and teaching time   Critical care was necessary to treat or prevent imminent or life-threatening deterioration of the following conditions:  Renal failure and CNS failure or compromise (Hyponatremia)   Critical care was time spent personally by me on the following  activities:  Blood draw for specimens, development of treatment plan with patient or surrogate, discussions with consultants, evaluation of patient's response to treatment, examination of patient, obtaining history from patient or surrogate, ordering and performing treatments and interventions, ordering and review of laboratory studies, ordering and review of radiographic studies, pulse oximetry, re-evaluation of patient's condition and review of old charts   (including critical care  time)  Medications Ordered in ED Medications - No data to display   Initial Impression / Assessment and Plan / ED Course  I have reviewed the triage vital signs and the nursing notes.  Pertinent labs & imaging results that were available during my care of the patient were reviewed by me and considered in my medical decision making (see chart for details).        Severe weakness and fatigue, the patient has history of liver cirrhosis and failure, this could be electrolyte, dehydration, hyperammonemia secondary to not taking her lactulose, could also consider infection.  She does not appear to need a paracentesis, she does not have significant tachypnea hypoxia tachycardia fever or hypotension.  Labs, ammonia, reevaluate.  There are multiple abnormal lab values including a sodium level which continues to go down and is currently 125, a creatinine which continues to go up and is currently 1.94 significantly elevated from baseline.  Her liver function is all abnormal, she is anemic but this seems chronic.  Ammonia level is pending  A family member has now come to the bedside, additional history obtained from the daughter who states that she has had some increasing weakness throughout the day.  Discussed with the hospitalist who will admit, she will need to be admitted for hyponatremia as well as her worsening renal function.  If this was hepatorenal syndrome it would be much more serious.  Discussed with Dr. Olevia Bowens, he is agreeable to admit, appreciate his willingness to care for this patient  Final Clinical Impressions(s) / ED Diagnoses   Final diagnoses:  Hyponatremia  Acute kidney injury ([place])  Chronic liver failure without hepatic coma ([place])  Acute encephalopathy      Noemi Chapel, MD 01/25/19 2255

## 2019-01-25 NOTE — H&P (Signed)
History and Physical    Jordan Le:[id number] DOB: 1946-01-29 DOA: 01/25/2019  PCP: Jordan Hilding, MD   Patient coming from: Home.  I have personally briefly reviewed patient's old medical records in [facility]  Chief Complaint: Abdominal distention, weakness and nausea.  HPI: Jordan Le is a 73 y.o. female with medical history significant of osteoarthritis, type 2 diabetes, endometrial cancer, hyperlipidemia, hypertension, history of TIA, history of pneumonia, renal disorder, liver cirrhosis who is coming to the emergency department due to abdominal distention, weakness and nausea since this morning.  She denies melena, hematochezia, emesis or diarrhea and states that her last BM was 2 days ago.  She is only taking lactulose as needed.  No headache, fever, chills, but feels fatigue with decreased appetite.  Denies rhinorrhea, sore throat, wheezing, dyspnea, hemoptysis.  No chest pain, palpitations, diaphoresis, PND, but occasionally gets some orthopnea and pitting edema lower extremities.  She denies dysuria, frequency or hematuria.  She denies polyuria, polydipsia, polyphagia or blurred vision.  ED Course: Initial vital signs temperature 98.3 F, pulse 98, respiration 18, blood pressure 118/50 mmHg and O2 sat 98% on room air.  The patient was given 250 mL of NS.  Urinalysis was unremarkable.  Her CBC showed a white count was 11.9, hemoglobin 9.7 g/dL and platelets 219.  CMP shows a sodium 125, potassium 5.1, chloride 92 and CO2 20 mmol/L.  BUN was 43, creatinine 1.94, glucose 182 and calcium 8.5 mg/dL.  Total protein 6.0, albumin 2.4 g/dL.  AST 76, ALT 55 and alkaline phosphatase 135 units/L.  Total bilirubin was 3.4 mg/dL.  Her ammonia level was 131 mol/L.  Review of Systems: As per HPI otherwise 10 point review of systems negative.   Past Medical History:  Diagnosis Date  . Arthritis   . Cirrhosis of liver ([place])   . Diabetes mellitus without complication ([place])    type  2  . Endometrial cancer ([place]) 05/07/2018  . High cholesterol   . Hypertension   . Mini stroke ([place])    2011  . Pneumonia   . Renal disorder     Past Surgical History:  Procedure Laterality Date  . ABDOMINAL HYSTERECTOMY    . BIOPSY  11/23/2018   Procedure: BIOPSY;  Surgeon: Jordan Binder, MD;  [facility];  Service: Endoscopy;;  . BREAST BIOPSY Right    ~15 years ago-2005  . ESOPHAGOGASTRODUODENOSCOPY N/A 11/23/2018   Procedure: ESOPHAGOGASTRODUODENOSCOPY (EGD);  Surgeon: Jordan Binder, MD;  [facility];  Service: Endoscopy;  Laterality: N/A;  1:00pm  . OTHER SURGICAL HISTORY  2009   Uterine Polyp removal  . polyps removed from the uterus    . ROBOTIC ASSISTED TOTAL HYSTERECTOMY WITH BILATERAL SALPINGO OOPHERECTOMY Bilateral 05/07/2018   Procedure: XI ROBOTIC ASSISTED TOTAL HYSTERECTOMY WITH BILATERAL SALPINGO OOPHORECTOMY PELVIC LYMPHANECTOMY;  Surgeon: Jordan Amber, MD;  [name];  Service: Gynecology;  Laterality: Bilateral;  . SENTINEL NODE BIOPSY Bilateral 05/07/2018   Procedure: SENTINEL NODE BIOPSY;  Surgeon: Jordan Amber, MD;  [name];  Service: Gynecology;  Laterality: Bilateral;     reports that she has never smoked. She has never used smokeless tobacco. She reports that she does not drink alcohol or use drugs.  No Known Allergies  Family History  Problem Relation Age of Onset  . Lung cancer Mother   . Hypertension Father   . Pancreatic cancer Brother   . Liver disease Neg Hx    Prior to  Admission medications   Medication Sig Start Date End Date Taking? Authorizing Provider  atorvastatin (LIPITOR) 10 MG tablet Take 1 tablet (10 mg total) by mouth daily at 6 PM. 11/05/18  Yes Le, Courage, MD  diltiazem (CARDIZEM CD) 120 MG 24 hr capsule Take 1 capsule (120 mg total) by mouth daily. 11/05/18 11/05/19 Yes Le, Courage, MD  docusate sodium (COLACE) 100 MG capsule Take 1 capsule (100 mg total) by mouth daily. 12/08/18  Yes  Le, Jordan D, DO  feeding supplement, ENSURE ENLIVE, (ENSURE ENLIVE) LIQD Take 237 mLs by mouth 2 (two) times daily between meals. Patient taking differently: Take 237 mLs by mouth daily at 2 PM.  11/05/18  Yes Le, Courage, MD  lactulose (CHRONULAC) 10 GM/15ML solution Take 30 mLs (20 g total) by mouth 3 (three) times daily. Patient taking differently: Take 20 g by mouth 2 (two) times daily as needed for moderate constipation.  12/08/18  Yes Manuella Ghazi, Jordan D, DO  levothyroxine (SYNTHROID) 75 MCG tablet Take 75 mcg by mouth daily before breakfast.  10/05/18 10/30/19 Yes [provider]  Multiple Vitamin (MULTIVITAMIN WITH MINERALS) TABS tablet Take 1 tablet by mouth daily. 11/06/18  Yes Le, Courage, MD  omeprazole (PRILOSEC) 20 MG capsule 1 PO 30 MINS PRIOR TO BREAKFAST. Patient taking differently: Take 20 mg by mouth daily before breakfast. 1 PO 30 MINS PRIOR TO BREAKFAST. 11/23/18  Yes Le, Jordan L, MD  Potassium 99 MG TABS Take 1 tablet by mouth daily.   Yes [provider]  sennosides-docusate sodium (SENOKOT-S) 8.6-50 MG tablet 1 PO BID Patient taking differently: Take 1 tablet by mouth 2 (two) times daily.  01/07/19  Yes Le, Jordan L, MD  spironolactone (ALDACTONE) 50 MG tablet Take 1 tablet (50 mg total) by mouth daily. 12/08/18 01/25/19 Yes Le, Jordan D, DO  torsemide (DEMADEX) 20 MG tablet Take 1 tablet (20 mg total) by mouth daily. 12/08/18 01/25/19 Yes Le, Jordan D, DO  zinc sulfate 220 (50 Zn) MG capsule Take 1 capsule (220 mg total) by mouth 3 (three) times daily. Patient taking differently: Take 220 mg by mouth every morning.  01/07/19  Yes Carlis Stable, NP  levOCARNitine, Dietary, 330 MG TABS Take 330 mg by mouth 3 (three) times daily. Patient not taking: Reported on 01/25/2019 01/07/19   Carlis Stable, NP  nystatin (MYCOSTATIN/NYSTOP) powder Apply topically 3 (three) times daily. Patient not taking: Reported on 01/25/2019 11/05/18   Jordan Hockey, MD   rifaximin (XIFAXAN) 550 MG TABS tablet Take 1 tablet (550 mg total) by mouth 2 (two) times daily. Patient not taking: Reported on 01/25/2019 01/07/19 02/06/19  Jordan Binder, MD    Physical Exam: Vitals:   01/25/19 2101 01/25/19 2104 01/25/19 2137  BP:  (!) 118/50   Pulse:  98   Resp:  18   Temp:  98.3 F (36.8 C) 98.3 F (36.8 C)  TempSrc:  Oral Rectal  SpO2:  98%   Weight: 83.5 kg      Constitutional: Somnolent, but otherwise in NAD, calm, comfortable Eyes: PERRL, lids and conjunctivae normal, icteric ENMT: Mucous membranes are moist. Posterior pharynx clear of any exudate or lesions. Neck: normal, supple, no masses, no thyromegaly Respiratory: Decreased breath sounds in bases, otherwise clear to auscultation bilaterally, no wheezing, no crackles. Normal respiratory effort. No accessory muscle use.  Cardiovascular: Regular rate and rhythm, no murmurs / rubs / gallops. No extremity edema. 2+ pedal pulses. No carotid bruits.  Abdomen: Distended, ascites, mild  diffuse tenderness, no guarding or rebound, no masses palpated. No hepatosplenomegaly. Bowel sounds positive.  Musculoskeletal: no clubbing / cyanosis.  Good ROM, no contractures. Normal muscle tone.  Skin:.  Positive icterus and areas of ecchymosis on extremities. Neurologic: CN 2-12 grossly intact. Sensation intact, DTR normal. Strength 5/5 in all 4.  Psychiatric: Somnolent, but wakes up easily, then oriented x 3. Normal mood.   Labs on Admission: I have personally reviewed following labs and imaging studies  CBC: Recent Labs  Lab 01/25/19 2127  WBC 11.9*  NEUTROABS 9.7*  HGB 9.7*  HCT 28.9*  MCV 94.1  PLT 591   Basic Metabolic Panel: Recent Labs  Lab 01/25/19 2127  NA 125*  K 5.1  CL 92*  CO2 20*  GLUCOSE 182*  BUN 43*  CREATININE 1.94*  CALCIUM 8.5*   GFR: Estimated Creatinine Clearance: 26 mL/min (A) (by C-G formula based on SCr of 1.94 mg/dL (H)). Liver Function Tests: Recent Labs  Lab 01/25/19  2127  AST 76*  ALT 55*  ALKPHOS 135*  BILITOT 3.4*  PROT 6.0*  ALBUMIN 2.4*   No results for input(s): LIPASE, AMYLASE in the last 168 hours. Recent Labs  Lab 01/25/19 2127  AMMONIA 131*   Coagulation Profile: No results for input(s): INR, PROTIME in the last 168 hours. Cardiac Enzymes: No results for input(s): CKTOTAL, CKMB, CKMBINDEX, TROPONINI in the last 168 hours. BNP (last 3 results) No results for input(s): PROBNP in the last 8760 hours. HbA1C: No results for input(s): HGBA1C in the last 72 hours. CBG: Recent Labs  Lab 01/25/19 2125  GLUCAP 172*   Lipid Profile: No results for input(s): CHOL, HDL, LDLCALC, TRIG, CHOLHDL, LDLDIRECT in the last 72 hours. Thyroid Function Tests: No results for input(s): TSH, T4TOTAL, FREET4, T3FREE, THYROIDAB in the last 72 hours. Anemia Panel: No results for input(s): VITAMINB12, FOLATE, FERRITIN, TIBC, IRON, RETICCTPCT in the last 72 hours. Urine analysis:    Component Value Date/Time   COLORURINE YELLOW 01/25/2019 2112   APPEARANCEUR HAZY (A) 01/25/2019 2112   LABSPEC 1.012 01/25/2019 2112   PHURINE 5.0 01/25/2019 2112   GLUCOSEU NEGATIVE 01/25/2019 2112   HGBUR NEGATIVE 01/25/2019 2112   [place] NEGATIVE 01/25/2019 2112   [place] NEGATIVE 01/25/2019 2112   PROTEINUR NEGATIVE 01/25/2019 2112   NITRITE NEGATIVE 01/25/2019 2112   LEUKOCYTESUR NEGATIVE 01/25/2019 2112    Radiological Exams on Admission: No results found.  10/27/2018 echo  IMPRESSIONS   1. The left ventricle has hyperdynamic systolic function, with an ejection fraction of >65%. The cavity size was normal. Left ventricular diastolic parameters were normal.  2. The right ventricle has normal systolic function. The cavity was normal. There is no increase in right ventricular wall thickness.  3. Large pleural effusion in the left lateral region.  4. No evidence of mitral valve stenosis.  5. The aortic valve is tricuspid. No stenosis of the aortic  valve.  6. The aorta is normal in size and structure.  7. The aortic root is normal in size and structure.  8. Pulmonary hypertension is indeterminate, inadequate TR jet  EKG: Independently reviewed.   Assessment/Plan Principal Problem:   AKI (acute kidney injury) ([place]) Due to poor oral intake/diuretic use. Observation/telemetry. Hydrate with NS overnight. Monitor intake and output. Follow renal function electrolytes.  Active Problems:   Hyponatremia Secondary to diuretics/decreased oral intake. Overnight IV hydration with normal saline. Hold torsemide for now. Follow-up sodium level.    Hepatic encephalopathy (HCC) Lactulose 3 times daily while in  the hospital. Would only use lactulose as needed for constipation. Follow-up ammonia level    Essential hypertension Hold antihypertensives for now. Monitor blood pressure.    Type 2 diabetes mellitus (HCC) Carbohydrate modified diet.    Hypothyroidism Continue levothyroxine 75 mcg p.o. daily.    Anemia Monitor H&H   DVT prophylaxis: SCDs. Code Status: Full code. Family Communication: Her daughter was present in the ED room. Disposition Plan: Observation for IV hydration and hyperammonemia treatment. Consults called: Admission status: Observation/telemetry.   Reubin Milan MD [facility]  If 7PM-7AM, please contact night-coverage [email or web address]  01/25/2019, 11:02 PM   This document was prepared using Dragon voice recognition software and may contain some unintended transcription errors.

## 2019-01-25 NOTE — Telephone Encounter (Signed)
Called [facility] and was advised they will call patient to schedule as they have standing order. Spoke with EG and she can have 4 more orders placed  Looks like pt already scheduled for 01/27/2019

## 2019-01-26 ENCOUNTER — Encounter (HOSPITAL_COMMUNITY): Payer: Self-pay | Admitting: Primary Care

## 2019-01-26 DIAGNOSIS — D649 Anemia, unspecified: Secondary | ICD-10-CM | POA: Diagnosis not present

## 2019-01-26 DIAGNOSIS — K7210 Chronic hepatic failure without coma: Secondary | ICD-10-CM | POA: Diagnosis present

## 2019-01-26 DIAGNOSIS — Z20828 Contact with and (suspected) exposure to other viral communicable diseases: Secondary | ICD-10-CM | POA: Diagnosis present

## 2019-01-26 DIAGNOSIS — E039 Hypothyroidism, unspecified: Secondary | ICD-10-CM | POA: Diagnosis present

## 2019-01-26 DIAGNOSIS — K2980 Duodenitis without bleeding: Secondary | ICD-10-CM | POA: Diagnosis present

## 2019-01-26 DIAGNOSIS — I129 Hypertensive chronic kidney disease with stage 1 through stage 4 chronic kidney disease, or unspecified chronic kidney disease: Secondary | ICD-10-CM | POA: Diagnosis present

## 2019-01-26 DIAGNOSIS — K7581 Nonalcoholic steatohepatitis (NASH): Secondary | ICD-10-CM | POA: Diagnosis present

## 2019-01-26 DIAGNOSIS — E7800 Pure hypercholesterolemia, unspecified: Secondary | ICD-10-CM | POA: Diagnosis present

## 2019-01-26 DIAGNOSIS — I471 Supraventricular tachycardia: Secondary | ICD-10-CM | POA: Diagnosis present

## 2019-01-26 DIAGNOSIS — K2970 Gastritis, unspecified, without bleeding: Secondary | ICD-10-CM | POA: Diagnosis present

## 2019-01-26 DIAGNOSIS — Z9114 Patient's other noncompliance with medication regimen: Secondary | ICD-10-CM | POA: Diagnosis not present

## 2019-01-26 DIAGNOSIS — D638 Anemia in other chronic diseases classified elsewhere: Secondary | ICD-10-CM | POA: Diagnosis present

## 2019-01-26 DIAGNOSIS — Z66 Do not resuscitate: Secondary | ICD-10-CM | POA: Diagnosis not present

## 2019-01-26 DIAGNOSIS — Z634 Disappearance and death of family member: Secondary | ICD-10-CM | POA: Diagnosis not present

## 2019-01-26 DIAGNOSIS — R188 Other ascites: Secondary | ICD-10-CM | POA: Diagnosis present

## 2019-01-26 DIAGNOSIS — Z515 Encounter for palliative care: Secondary | ICD-10-CM | POA: Diagnosis not present

## 2019-01-26 DIAGNOSIS — E785 Hyperlipidemia, unspecified: Secondary | ICD-10-CM | POA: Diagnosis present

## 2019-01-26 DIAGNOSIS — Z7189 Other specified counseling: Secondary | ICD-10-CM | POA: Diagnosis not present

## 2019-01-26 DIAGNOSIS — N183 Chronic kidney disease, stage 3 unspecified: Secondary | ICD-10-CM | POA: Diagnosis present

## 2019-01-26 DIAGNOSIS — K3189 Other diseases of stomach and duodenum: Secondary | ICD-10-CM | POA: Diagnosis present

## 2019-01-26 DIAGNOSIS — N179 Acute kidney failure, unspecified: Secondary | ICD-10-CM | POA: Diagnosis present

## 2019-01-26 DIAGNOSIS — E871 Hypo-osmolality and hyponatremia: Secondary | ICD-10-CM | POA: Diagnosis present

## 2019-01-26 DIAGNOSIS — K7200 Acute and subacute hepatic failure without coma: Secondary | ICD-10-CM | POA: Diagnosis present

## 2019-01-26 DIAGNOSIS — K7460 Unspecified cirrhosis of liver: Secondary | ICD-10-CM | POA: Diagnosis present

## 2019-01-26 DIAGNOSIS — T502X5A Adverse effect of carbonic-anhydrase inhibitors, benzothiadiazides and other diuretics, initial encounter: Secondary | ICD-10-CM | POA: Diagnosis present

## 2019-01-26 DIAGNOSIS — E1122 Type 2 diabetes mellitus with diabetic chronic kidney disease: Secondary | ICD-10-CM | POA: Diagnosis present

## 2019-01-26 LAB — COMPREHENSIVE METABOLIC PANEL
ALT: 48 U/L — ABNORMAL HIGH (ref 0–44)
AST: 63 U/L — ABNORMAL HIGH (ref 15–41)
Albumin: 2.3 g/dL — ABNORMAL LOW (ref 3.5–5.0)
Alkaline Phosphatase: 128 U/L — ABNORMAL HIGH (ref 38–126)
Anion gap: 9 (ref 5–15)
BUN: 44 mg/dL — ABNORMAL HIGH (ref 8–23)
CO2: 21 mmol/L — ABNORMAL LOW (ref 22–32)
Calcium: 8.4 mg/dL — ABNORMAL LOW (ref 8.9–10.3)
Chloride: 100 mmol/L (ref 98–111)
Creatinine, Ser: 1.74 mg/dL — ABNORMAL HIGH (ref 0.44–1.00)
GFR calc Af Amer: 33 mL/min — ABNORMAL LOW (ref >60)
GFR calc non Af Amer: 28 mL/min — ABNORMAL LOW (ref >60)
Glucose, Bld: 142 mg/dL — ABNORMAL HIGH (ref 70–99)
Potassium: 4.6 mmol/L (ref 3.5–5.1)
Sodium: 130 mmol/L — ABNORMAL LOW (ref 135–145)
Total Bilirubin: 3.7 mg/dL — ABNORMAL HIGH (ref 0.3–1.2)
Total Protein: 5.7 g/dL — ABNORMAL LOW (ref 6.5–8.1)

## 2019-01-26 LAB — CBC WITH DIFFERENTIAL/PLATELET
Abs Immature Granulocytes: 0.07 10*3/uL (ref 0.00–0.07)
Basophils Absolute: 0.1 10*3/uL (ref 0.0–0.1)
Basophils Relative: 1 %
Eosinophils Absolute: 0.2 10*3/uL (ref 0.0–0.5)
Eosinophils Relative: 2 %
HCT: 26.6 % — ABNORMAL LOW (ref 36.0–46.0)
Hemoglobin: 8.8 g/dL — ABNORMAL LOW (ref 12.0–15.0)
Immature Granulocytes: 1 %
Lymphocytes Relative: 11 %
Lymphs Abs: 1.2 10*3/uL (ref 0.7–4.0)
MCH: 31.5 pg (ref 26.0–34.0)
MCHC: 33.1 g/dL (ref 30.0–36.0)
MCV: 95.3 fL (ref 80.0–100.0)
Monocytes Absolute: 1.6 10*3/uL — ABNORMAL HIGH (ref 0.1–1.0)
Monocytes Relative: 15 %
Neutro Abs: 7.7 10*3/uL (ref 1.7–7.7)
Neutrophils Relative %: 70 %
Platelets: 184 10*3/uL (ref 150–400)
RBC: 2.79 MIL/uL — ABNORMAL LOW (ref 3.87–5.11)
RDW: 16.4 % — ABNORMAL HIGH (ref 11.5–15.5)
WBC: 10.9 10*3/uL — ABNORMAL HIGH (ref 4.0–10.5)
nRBC: 0 % (ref 0.0–0.2)

## 2019-01-26 LAB — SARS CORONAVIRUS 2 (TAT 6-24 HRS): SARS Coronavirus 2: NEGATIVE

## 2019-01-26 LAB — AMMONIA: Ammonia: 56 umol/L — ABNORMAL HIGH (ref 9–35)

## 2019-01-26 MED ORDER — LEVOTHYROXINE SODIUM 75 MCG PO TABS
75.0000 ug | ORAL_TABLET | Freq: Every day | ORAL | Status: DC
  Administered 2019-01-26 – 2019-01-30 (×5): 75 ug via ORAL
  Filled 2019-01-26 (×5): qty 1

## 2019-01-26 MED ORDER — PROCHLORPERAZINE EDISYLATE 10 MG/2ML IJ SOLN
5.0000 mg | INTRAMUSCULAR | Status: DC | PRN
  Administered 2019-01-26 – 2019-01-29 (×2): 5 mg via INTRAVENOUS
  Filled 2019-01-26 (×2): qty 2

## 2019-01-26 MED ORDER — ENSURE ENLIVE PO LIQD
237.0000 mL | Freq: Every day | ORAL | Status: DC
  Administered 2019-01-28: 237 mL via ORAL

## 2019-01-26 MED ORDER — ZINC SULFATE 220 (50 ZN) MG PO CAPS
220.0000 mg | ORAL_CAPSULE | Freq: Every morning | ORAL | Status: DC
  Administered 2019-01-26 – 2019-01-30 (×5): 220 mg via ORAL
  Filled 2019-01-26 (×5): qty 1

## 2019-01-26 MED ORDER — SODIUM CHLORIDE 0.9 % IV SOLN
INTRAVENOUS | Status: DC
  Administered 2019-01-26: 02:00:00 via INTRAVENOUS

## 2019-01-26 MED ORDER — DILTIAZEM HCL ER COATED BEADS 120 MG PO CP24
120.0000 mg | ORAL_CAPSULE | Freq: Every day | ORAL | Status: DC
  Administered 2019-01-26 – 2019-01-30 (×5): 120 mg via ORAL
  Filled 2019-01-26 (×5): qty 1

## 2019-01-26 MED ORDER — DOCUSATE SODIUM 100 MG PO CAPS
100.0000 mg | ORAL_CAPSULE | Freq: Every day | ORAL | Status: DC
  Administered 2019-01-26 – 2019-01-28 (×3): 100 mg via ORAL
  Filled 2019-01-26 (×3): qty 1

## 2019-01-26 MED ORDER — PANTOPRAZOLE SODIUM 40 MG PO TBEC
40.0000 mg | DELAYED_RELEASE_TABLET | Freq: Every day | ORAL | Status: DC
  Administered 2019-01-26 – 2019-01-30 (×5): 40 mg via ORAL
  Filled 2019-01-26 (×5): qty 1

## 2019-01-26 MED ORDER — SODIUM CHLORIDE 0.9 % IV SOLN
INTRAVENOUS | Status: DC
  Administered 2019-01-26 – 2019-01-27 (×3): via INTRAVENOUS

## 2019-01-26 NOTE — Care Management Obs Status (Signed)
[place] NOTIFICATION   Patient Details  Name: Jordan Le MRN: [id number] Date of Birth: 11-Feb-1946   Medicare Observation Status Notification Given:  Yes    [name] 01/26/2019, 4:07 PM

## 2019-01-26 NOTE — Consult Note (Signed)
Consultation Note Date: 01/26/2019   Patient Name: Jordan Le  DOB: [birth year]  MRN: [id number]  Age / Sex: 73 y.o., female  PCP: Quintin Alto [name], MD Referring Physician: Roxan Hockey, MD  Reason for Consultation: Establishing goals of care and Psychosocial/spiritual support  HPI/Patient Profile: 73 y.o. female  with past medical history of Osteoarthritis, DM2,  endometrial cancer, HTN/HLD, history of TIA, history of pneumonia, renal disorder, liver cirrhosis  admitted on 01/25/2019 with acute kidney injury, hyponatremia, hepatic encephalopathy.   Clinical Assessment and Goals of Care: Jordan Le is lying quietly in bed.  She appears acutely/chronically ill and frail.  Her abdomen is very distended and tight.  She is alert and oriented, calm and cooperative.  She is able to make her basic needs known.  There is no family at bedside at this time.  Jordan Le shares that her husband died recently (within the last 4 to 45 weeks).  She shares that she has been independent with ADLs.  Her sister and her mildly mentally challenged niece have lived in her home for 5 to 6 years, but her sister is now a resident of SNF.  We talked about healthcare power of attorney, see below. We talked about CODE STATUS, see below.  Jordan Le tells me that she was doing "fine" until yesterday.  I ask what her GI doctors have told her about what is next.  She shares that she does not have an appointment with GI for another 2 to 3 months.  Jordan Le tells me that she did see the kidney doctor recently and was told that she has "no problems".  We talked about discharge plan.  Ms. Balliet tells me that she would not go to rehab, she prefers home.  We talked about the benefits of at home hospice, I encouraged her to consider how she wants [place] to care for her.  Conference with attending related to patient condition, meld score  which is low 30s indicating a 53% mortality in 3 months.  Attending reaches out to daughters Jordan Le and Jordan Le, and PMT participates via phone.  We talked in detail about chronic illness pathway, Pleurx drain placement, at home hospice.  We plan for a palliative meeting tomorrow at 1 PM..  Conference with attending and bedside nursing related to patient condition, needs, goals of care, CODE STATUS.  HCPOA   NEXT OF KIN -Jordan Le names her daughter, Jordan Le as her Ambulance person.  Daughter, Jordan Le also participates in decision-making.  Jordan Le husband, Jordan Le, died at home under [facility] hospice care with brain cancer sometime within the last 4 to 6 weeks.    SUMMARY OF RECOMMENDATIONS   Continue to treat the treatable but no extraordinary measures Family meeting 11/4 at 1 PM Considering next steps  Code Status/Advance Care Planning:  DNR -we discussed [place] today and Mrs. Chain tells me she would like to allow a natural death.  This is discussed with daughters who agree with this decision for a DNR  Symptom Management:   Per hospitalist, no additional needs at this time.  Palliative Prophylaxis:   Frequent Pain Assessment and Turn Reposition  Additional Recommendations (Limitations, Scope, Preferences):  Treat the treatable but no CPR, no intubation  Psycho-social/Spiritual:   Desire for further Chaplaincy support:no  Additional Recommendations: Caregiving  Support/Resources and Education on Hospice  Prognosis:   < 3 months, would not be surprising based on meld score that shows 53% mortality in 3 months.  Discharge Planning: To be determined, based on outcomes.      Primary Diagnoses: Present on Admission: . AKI (acute kidney injury) ([place]) . Hepatic encephalopathy ([place]) . Essential hypertension . Hyponatremia . Anemia . Hypothyroidism   I have reviewed the medical record, interviewed the patient and  family, and examined the patient. The following aspects are pertinent.  Past Medical History:  Diagnosis Date  . Arthritis   . Cirrhosis of liver ([place])   . Diabetes mellitus without complication ([place])    type 2  . Endometrial cancer ([place]) 05/07/2018  . High cholesterol   . Hypertension   . Mini stroke ([place])    2011  . Pneumonia   . Renal disorder    Social History   Socioeconomic History  . Marital status: Married    Spouse name: Not on file  . Number of children: Not on file  . Years of education: Not on file  . Highest education level: Not on file  Occupational History  . Not on file  Social Needs  . Financial resource strain: Not on file  . Food insecurity    Worry: Not on file    Inability: Not on file  . Transportation needs    Medical: Not on file    Non-medical: Not on file  Tobacco Use  . Smoking status: Never Smoker  . Smokeless tobacco: Never Used  Substance and Sexual Activity  . Alcohol use: Never    Frequency: Never  . Drug use: Never  . Sexual activity: Not Currently  Lifestyle  . Physical activity    Days per week: Not on file    Minutes per session: Not on file  . Stress: Not on file  Relationships  . Social Herbalist on phone: Not on file    Gets together: Not on file    Attends religious service: Not on file    Active member of club or organization: Not on file    Attends meetings of clubs or organizations: Not on file    Relationship status: Not on file  Other Topics Concern  . Not on file  Social History Narrative  . Not on file   Family History  Problem Relation Age of Onset  . Lung cancer Mother   . Hypertension Father   . Pancreatic cancer Brother   . Liver disease Neg Hx    Scheduled Meds: . diltiazem  120 mg Oral Daily  . docusate sodium  100 mg Oral Daily  . feeding supplement (ENSURE ENLIVE)  237 mL Oral Q1400  . lactulose  30 g Oral TID  . levothyroxine  75 mcg Oral QAC breakfast  . pantoprazole  40 mg Oral  Daily  . zinc sulfate  220 mg Oral q morning - 10a   Continuous Infusions: . sodium chloride 75 mL/hr at 01/26/19 1041   PRN Meds:.prochlorperazine Medications Prior to Admission:  Prior to Admission medications   Medication Sig Start Date End Date Taking? Authorizing Provider  atorvastatin (LIPITOR) 10 MG  tablet Take 1 tablet (10 mg total) by mouth daily at 6 PM. 11/05/18  Yes Emokpae, Courage, MD  diltiazem (CARDIZEM CD) 120 MG 24 hr capsule Take 1 capsule (120 mg total) by mouth daily. 11/05/18 11/05/19 Yes Emokpae, Courage, MD  docusate sodium (COLACE) 100 MG capsule Take 1 capsule (100 mg total) by mouth daily. 12/08/18  Yes Shah, Pratik D, DO  feeding supplement, ENSURE ENLIVE, (ENSURE ENLIVE) LIQD Take 237 mLs by mouth 2 (two) times daily between meals. Patient taking differently: Take 237 mLs by mouth daily at 2 PM.  11/05/18  Yes Emokpae, Courage, MD  lactulose (CHRONULAC) 10 GM/15ML solution Take 30 mLs (20 g total) by mouth 3 (three) times daily. Patient taking differently: Take 20 g by mouth 2 (two) times daily as needed for moderate constipation.  12/08/18  Yes Manuella Ghazi, Pratik D, DO  levothyroxine (SYNTHROID) 75 MCG tablet Take 75 mcg by mouth daily before breakfast.  10/05/18 10/30/19 Yes [provider]  Multiple Vitamin (MULTIVITAMIN WITH MINERALS) TABS tablet Take 1 tablet by mouth daily. 11/06/18  Yes Emokpae, Courage, MD  omeprazole (PRILOSEC) 20 MG capsule 1 PO 30 MINS PRIOR TO BREAKFAST. Patient taking differently: Take 20 mg by mouth daily before breakfast. 1 PO 30 MINS PRIOR TO BREAKFAST. 11/23/18  Yes Fields, Sandi L, MD  Potassium 99 MG TABS Take 1 tablet by mouth daily.   Yes [provider]  sennosides-docusate sodium (SENOKOT-S) 8.6-50 MG tablet 1 PO BID Patient taking differently: Take 1 tablet by mouth 2 (two) times daily.  01/07/19  Yes Fields, Sandi L, MD  spironolactone (ALDACTONE) 50 MG tablet Take 1 tablet (50 mg total) by mouth daily. 12/08/18 01/25/19  Yes Shah, Pratik D, DO  torsemide (DEMADEX) 20 MG tablet Take 1 tablet (20 mg total) by mouth daily. 12/08/18 01/25/19 Yes Shah, Pratik D, DO  zinc sulfate 220 (50 Zn) MG capsule Take 1 capsule (220 mg total) by mouth 3 (three) times daily. Patient taking differently: Take 220 mg by mouth every morning.  01/07/19  Yes Carlis Stable, NP  levOCARNitine, Dietary, 330 MG TABS Take 330 mg by mouth 3 (three) times daily. Patient not taking: Reported on 01/25/2019 01/07/19   Carlis Stable, NP  nystatin (MYCOSTATIN/NYSTOP) powder Apply topically 3 (three) times daily. Patient not taking: Reported on 01/25/2019 11/05/18   Roxan Hockey, MD  rifaximin (XIFAXAN) 550 MG TABS tablet Take 1 tablet (550 mg total) by mouth 2 (two) times daily. Patient not taking: Reported on 01/25/2019 01/07/19 02/06/19  Danie Binder, MD   No Known Allergies Review of Systems  Unable to perform ROS: Acuity of condition    Physical Exam Vitals signs and nursing note reviewed.  Constitutional:      General: She is not in acute distress.    Appearance: She is ill-appearing.  Cardiovascular:     Rate and Rhythm: Normal rate.  Pulmonary:     Effort: Pulmonary effort is normal. No respiratory distress.  Abdominal:     General: Abdomen is flat. There is no distension.     Tenderness: There is no abdominal tenderness.  Musculoskeletal:        General: No swelling.     Comments: Muscle wasting  Skin:    General: Skin is warm and dry.  Neurological:     Mental Status: She is alert.     Comments: Known dementia   Psychiatric:     Comments: Calm and cooperative     Vital Signs:  BP 110/60 Comment: Manual BP  Pulse (!) 125   Temp 98.5 F (36.9 C) (Oral)   Resp 20   Ht 5\' 3"  (1.6 m)   Wt 83.5 kg   SpO2 97%   BMI 32.59 kg/m  Pain Scale: 0-10   Pain Score: 0-No pain   SpO2: SpO2: 97 % O2 Device:SpO2: 97 % O2 Flow Rate: .   IO: Intake/output summary:   Intake/Output Summary (Last 24 hours) at 01/26/2019 1154  Last data filed at 01/26/2019 0900 Gross per 24 hour  Intake 1808.43 ml  Output -  Net 1808.43 ml    LBM: Last BM Date: 01/25/19 Baseline Weight: Weight: 83.5 kg Most recent weight: Weight: 83.5 kg     Palliative Assessment/Data:   Flowsheet Rows     Most Recent Value  Intake Tab  Referral Department  Hospitalist  Unit at Time of Referral  Med/Surg Unit  Palliative Care Primary Diagnosis  Other (Comment)  Date Notified  01/26/19  Palliative Care Type  New Palliative care  Reason for referral  Clarify Goals of Care  Date of Admission  01/25/19  Date first seen by Palliative Care  01/26/19  # of days Palliative referral response time  0 Day(s)  # of days IP prior to Palliative referral  1  Clinical Assessment  Palliative Performance Scale Score  30%  Pain Max last 24 hours  Not able to report  Pain Min Last 24 hours  Not able to report  Dyspnea Max Last 24 Hours  Not able to report  Dyspnea Min Last 24 hours  Not able to report  Psychosocial & Spiritual Assessment  Palliative Care Outcomes      Time In: 1100 Time Out: 1220 Time Total: 70 minutes  Greater than 50%  of this time was spent counseling and coordinating care related to the above assessment and plan.  Signed by: Drue Novel, NP   Please contact Palliative Medicine Team phone at [phone] for questions and concerns.  For individual provider: See Shea Evans

## 2019-01-26 NOTE — Progress Notes (Signed)
Patient Demographics:    Jordan Le, is a 73 y.o. female, DOB - 1946-02-03, MVH:[id number]  Admit date - 01/25/2019   Admitting Physician Reubin Milan, MD  Outpatient Primary MD for the patient is [name], [name], MD  LOS - 0   Chief Complaint  Patient presents with   Weakness        Subjective:    Jordan Le today has no fevers, no emesis,  No chest pain,    MELD score this admission- 31 points , 53 % 3 month Mortality  More awake, less sleepy,  -Appetite is poor   Assessment  & Plan :    Principal Problem:   AKI (acute kidney injury) ([place]) Active Problems:   Essential hypertension   Hepatic encephalopathy ([place])   Type 2 diabetes mellitus ([place])   Hyponatremia   Anemia   Hypothyroidism  Brief History: 73 year old female with  DM2,  endometrial cancer (status post hysterectomy February 202)  HTN/HLD, history of TIA, CKD III, NASH liver cirrhosis  admitted on 01/25/2019 with  hyponatremia, hepatic encephalopathy and AKI on CKD III   Assessment/Plan:  1)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--- --Hepatitis B surface antigen--neg -Hepatitis C antibody--neg -Antimitochondrial antibody negative -Anti-smooth muscle antibody negative -Alpha-1 antitrypsin level--normal -Alpha-fetoprotein pending -ANA negative -Patient gets paracentesis about every 10 days -Not compliant with lactulose at home, -Ammonia on admission was 131, down to 56 now baseline usually in the 40s -MELD score this admission- 31 points , 53 % 3 month Mortality   2)AKI----acute kidney injury on CKD stage - III-suspect due to decreased oral intake in the setting of increased lethargy compounded by some component of hepatorenal syndrome -   creatinine on admission=  1.94,   baseline creatinine =1.2 to 1.3    , creatinine is now= 1.74 , renally adjust medications, avoid nephrotoxic  agents / dehydration /hypotension -Continue IV fluids especially in the setting of lactulose use with GI losses  3)H/o SVT/Atrial tachycardia -Echo from 11/12/2018 with EF over 65%,- -TSH 5.4 -Consider propranolol or atenolol if tachycardia persist  4)H/o DM2  -10/26/18 Hemoglobin A1c--4.7--putting her at risk for hypoglycemia --Discontinued metformin - 5)Hypothyroidism -TSH is 5.4 -Continue Synthroid  6)H/o Endometrial Carcinoma -Status post hysterectomy 05/07/2018 -follow up Dr. Denman George  7)Social/Ethics- family conference with Palliative care and pt's 2 daughters----  Discussed with daughters x 2 (Ms Shirley Friar- [phone]) and Nancie Neas--- [phone]) Pt is a DNR,  Palliative care consult appreciated   Family Communication:Discussed with daughters x 2 (Ms Shirley Friar- [phone]) and Nancie Neas--- [phone])  Consultants:Palliative care  Code Status: DNR  DVT Prophylaxis: SCD  Disposition/Need for in-Hospital Stay- patient unable to be discharged at this time due to --- Next IV fluids due to GI losses induced by lactulos  Disposition Plan  : TBD  Lab Results  Component Value Date   PLT 184 01/26/2019    Inpatient Medications  Scheduled Meds:  diltiazem  120 mg Oral Daily   docusate sodium  100 mg Oral Daily   feeding supplement (ENSURE ENLIVE)  237 mL Oral Q1400   lactulose  30 g Oral TID   levothyroxine  75 mcg Oral QAC breakfast   pantoprazole  40 mg Oral Daily   zinc sulfate  220 mg Oral q morning - 10a   Continuous Infusions:  sodium chloride 100 mL/hr at 01/26/19 0152   PRN Meds:.prochlorperazine    Anti-infectives (From admission, onward)   None        Objective:   Vitals:   01/26/19 0018 01/26/19 0133 01/26/19 0556 01/26/19 0600  BP: (!) 116/43 (!) 116/43 (!) 113/33 110/60  Pulse: 95 95 95 (!) 125  Resp: 16 16 20    Temp: 98.4 F (36.9 C) 98.4 F (36.9 C) 98.5 F (36.9 C)     TempSrc: Oral Oral Oral   SpO2: 99%  97% 97%  Weight:  83.5 kg    Height:  5\' 3"  (1.6 m)      Wt Readings from Last 3 Encounters:  01/26/19 83.5 kg  01/04/19 79.8 kg  12/30/18 83.4 kg     Intake/Output Summary (Last 24 hours) at 01/26/2019 6073 Last data filed at 01/26/2019 0400 Gross per 24 hour  Intake 1808.43 ml  Output --  Net 1808.43 ml    Physical Exam  Gen:- Awake Alert, resting comfortably, chronically ill-appearing HEENT:- [place].AT, No sclera icterus Neck-Supple Neck,No JVD,.  Lungs-diminished in bases, no wheezing  CV- S1, S2 normal, regular  Abd-  +ve B.Sounds, distended consistent with ascites Extremity/Skin:- 2 +  edema, pedal pulses present  Psych-less confused, more coherent  neuro-generalized weakness, no new focal deficits,      Data Review:   Micro Results No results found for this or any previous visit (from the past 240 hour(s)).  Radiology Reports US Paracentesis  Result Date: 01/13/2019 INDICATION: Cirrhosis Ascites EXAM: ULTRASOUND GUIDED RLQ PARACENTESIS MEDICATIONS: 10 cc 1% lidocaine COMPLICATIONS: None immediate. PROCEDURE: Informed written consent was obtained from the patient after a discussion of the risks, benefits and alternatives to treatment. A timeout was performed prior to the initiation of the procedure. Initial ultrasound scanning demonstrates a large amount of ascites within the right lower abdominal quadrant. The right lower abdomen was prepped and draped in the usual sterile fashion. 1% lidocaine was used for local anesthesia. Following this, a 7 cm Yeuh catheter was introduced. An ultrasound image was saved for documentation purposes. The paracentesis was performed. The catheter was removed and a dressing was applied. The patient tolerated the procedure well without immediate post procedural complication. Patient received post-procedure intravenous albumin; see nursing notes for details. FINDINGS: A total of approximately 6.2 Liters of  reddish fluid was removed. Samples were sent to the laboratory as requested by the clinical team. IMPRESSION: Successful ultrasound-guided paracentesis yielding 6.2 liters of peritoneal fluid. Read by Lavonia Drafts [facility] Electronically Signed   By: Lavonia Dana M.D.   On: 01/13/2019 10:14   US Paracentesis  Result Date: 01/01/2019 INDICATION: Cirrhosis, ascites EXAM: ULTRASOUND GUIDED DIAGNOSTIC AND THERAPEUTIC PARACENTESIS MEDICATIONS: None COMPLICATIONS: None immediate PROCEDURE: Informed written consent was obtained from the patient after a discussion of the risks, benefits and alternatives to treatment. A timeout was performed prior to the initiation of the procedure. Initial ultrasound scanning demonstrates a large amount of ascites within the right lower abdominal quadrant. The right lower abdomen was prepped and draped in the usual sterile fashion. 1% lidocaine was used for local anesthesia. Following this, a 5 [place] Yueh catheter was introduced. An ultrasound image was saved for documentation purposes. The paracentesis was performed. The catheter was removed and a dressing was applied. The patient tolerated the procedure well without immediate post procedural complication. Patient received post-procedure intravenous albumin; see nursing notes for details. FINDINGS:  A total of approximately 4.2 L of amber colored ascitic fluid was removed. Samples were sent to the laboratory as requested by the clinical team. IMPRESSION: Successful ultrasound-guided paracentesis yielding 4.2 liters of peritoneal fluid. Electronically Signed   By: Lavonia Dana M.D.   On: 01/01/2019 13:31     CBC Recent Labs  Lab 01/25/19 2127 01/26/19 0458  WBC 11.9* 10.9*  HGB 9.7* 8.8*  HCT 28.9* 26.6*  PLT 219 184  MCV 94.1 95.3  MCH 31.6 31.5  MCHC 33.6 33.1  RDW 16.2* 16.4*  LYMPHSABS 0.8 1.2  MONOABS 1.2* 1.6*  EOSABS 0.1 0.2  BASOSABS 0.1 0.1    Chemistries  Recent Labs  Lab 01/25/19 2127 01/26/19 0458  NA  125* 130*  K 5.1 4.6  CL 92* 100  CO2 20* 21*  GLUCOSE 182* 142*  BUN 43* 44*  CREATININE 1.94* 1.74*  CALCIUM 8.5* 8.4*  AST 76* 63*  ALT 55* 48*  ALKPHOS 135* 128*  BILITOT 3.4* 3.7*   ------------------------------------------------------------------------------------------------------------------ No results for input(s): CHOL, HDL, LDLCALC, TRIG, CHOLHDL, LDLDIRECT in the last 72 hours.  Lab Results  Component Value Date   HGBA1C 4.7 (L) 10/26/2018   ------------------------------------------------------------------------------------------------------------------ No results for input(s): TSH, T4TOTAL, T3FREE, THYROIDAB in the last 72 hours.  Invalid input(s): FREET3 ------------------------------------------------------------------------------------------------------------------ No results for input(s): VITAMINB12, FOLATE, FERRITIN, TIBC, IRON, RETICCTPCT in the last 72 hours.  Coagulation profile Recent Labs  Lab 01/25/19 2127  INR 1.9*    No results for input(s): DDIMER in the last 72 hours.  Cardiac Enzymes No results for input(s): CKMB, TROPONINI, MYOGLOBIN in the last 168 hours.  Invalid input(s): CK ------------------------------------------------------------------------------------------------------------------    Component Value Date/Time   BNP 48.0 01/25/2019 2127     Roxan Hockey M.D on 01/26/2019 at 8:22 AM  Go to [email or web address] - for contact info  [facility] - Office  [phone]

## 2019-01-26 NOTE — Telephone Encounter (Signed)
Xifaxan did not require a PA.  It would cost $825.00. I have called her insurance and spoke to [employer]. I have initiated a tier exception. She was having computer problems and will call me back or fax me the info.

## 2019-01-27 ENCOUNTER — Inpatient Hospital Stay (HOSPITAL_COMMUNITY): Payer: PPO

## 2019-01-27 ENCOUNTER — Ambulatory Visit (HOSPITAL_COMMUNITY): Payer: PPO

## 2019-01-27 LAB — URINE CULTURE: Culture: NO GROWTH

## 2019-01-27 LAB — COMPREHENSIVE METABOLIC PANEL
ALT: 42 U/L (ref 0–44)
AST: 48 U/L — ABNORMAL HIGH (ref 15–41)
Albumin: 2.2 g/dL — ABNORMAL LOW (ref 3.5–5.0)
Alkaline Phosphatase: 117 U/L (ref 38–126)
Anion gap: 12 (ref 5–15)
BUN: 46 mg/dL — ABNORMAL HIGH (ref 8–23)
CO2: 19 mmol/L — ABNORMAL LOW (ref 22–32)
Calcium: 8.6 mg/dL — ABNORMAL LOW (ref 8.9–10.3)
Chloride: 100 mmol/L (ref 98–111)
Creatinine, Ser: 1.7 mg/dL — ABNORMAL HIGH (ref 0.44–1.00)
GFR calc Af Amer: 34 mL/min — ABNORMAL LOW (ref >60)
GFR calc non Af Amer: 29 mL/min — ABNORMAL LOW (ref >60)
Glucose, Bld: 129 mg/dL — ABNORMAL HIGH (ref 70–99)
Potassium: 4.2 mmol/L (ref 3.5–5.1)
Sodium: 131 mmol/L — ABNORMAL LOW (ref 135–145)
Total Bilirubin: 3.8 mg/dL — ABNORMAL HIGH (ref 0.3–1.2)
Total Protein: 5.5 g/dL — ABNORMAL LOW (ref 6.5–8.1)

## 2019-01-27 LAB — CBC WITH DIFFERENTIAL/PLATELET
Abs Immature Granulocytes: 0.09 10*3/uL — ABNORMAL HIGH (ref 0.00–0.07)
Basophils Absolute: 0.1 10*3/uL (ref 0.0–0.1)
Basophils Relative: 1 %
Eosinophils Absolute: 0.5 10*3/uL (ref 0.0–0.5)
Eosinophils Relative: 4 %
HCT: 25 % — ABNORMAL LOW (ref 36.0–46.0)
Hemoglobin: 8.3 g/dL — ABNORMAL LOW (ref 12.0–15.0)
Immature Granulocytes: 1 %
Lymphocytes Relative: 9 %
Lymphs Abs: 1.2 10*3/uL (ref 0.7–4.0)
MCH: 31.7 pg (ref 26.0–34.0)
MCHC: 33.2 g/dL (ref 30.0–36.0)
MCV: 95.4 fL (ref 80.0–100.0)
Monocytes Absolute: 2.9 10*3/uL — ABNORMAL HIGH (ref 0.1–1.0)
Monocytes Relative: 21 %
Neutro Abs: 9 10*3/uL — ABNORMAL HIGH (ref 1.7–7.7)
Neutrophils Relative %: 64 %
Platelets: 178 10*3/uL (ref 150–400)
RBC: 2.62 MIL/uL — ABNORMAL LOW (ref 3.87–5.11)
RDW: 16.3 % — ABNORMAL HIGH (ref 11.5–15.5)
WBC: 13.7 10*3/uL — ABNORMAL HIGH (ref 4.0–10.5)
nRBC: 0 % (ref 0.0–0.2)

## 2019-01-27 MED ORDER — ALBUMIN HUMAN 25 % IV SOLN
25.0000 g | Freq: Once | INTRAVENOUS | Status: AC
  Administered 2019-01-27: 25 g via INTRAVENOUS
  Filled 2019-01-27: qty 50

## 2019-01-27 NOTE — Progress Notes (Signed)
Palliative: Jordan Le is sitting up in the Jordan Le chair in her room.  She greets me making and mostly keeping eye contact.  She appears acutely/chronically ill and frail.  Her abdomen continues to be distended, but she is not guarding.  Present today at bedside his daughter Jordan Le who calls daughter Jordan Le to participate in our conference.   We talk in detail about Jordan Le acute and chronic health concerns.  She shares that she has only been tapped 3 or 4 times, patient and family endorsed a relatively quick decline in her health.  Jordan Le shares that her daughters assist with getting groceries and cooking food, but she often will not eat more than a few bites at a time.  Daughter Jordan Le asks about difficulty in keeping food and medications down, stating that Jordan Le will complain of GI concerns.  We talked about the large amount of fluid in her belly.  We review selected labs in detail and occluding but not limited to ammonia, creatinine, slightly increasing white blood cells decrease in albumin.  We reviewed the treatment plan including consults from GI doctor about Xifaxan.    We talked about some "what if's and maybe's", future care.  Jordan Le shares that she was scheduled to see the gynecologist next week for help with (cystocele?),  And asks if she should keep this appointment.  I recognize the difficulty in her getting to appointments and encouraged her to consider if keeping the appointment would benefit her comfort.    Jordan Le and her daughters are requesting a permanent Pleurx drain.  They endorse her difficulty in getting to and scheduleing for paracentesis.  We talked about the risk for infection, SBP.  We also talked about hepatorenal syndrome when too much fluid is removed, too quickly.  Family asks about hospice care, how would they start hospice care if they choose to do so in 1 week or 1 month.  We talked about "surveillance" with [facility] hospice, and  they request that Jordan Le information be sent.     Plan: Patient and family are requesting surveillance with hospice of [facility].  [name] husband recently passed away with hospice of [facility] in-home care. Patient and family are requesting permanent Pleurx drain to manage paracentesis.   80 minutes, extended time  Jordan Axe, NP Palliative Medicine Team Team Phone # [phone] Greater than 50% of this time was spent counseling and coordinating care related to the above assessment and plan.

## 2019-01-27 NOTE — TOC Initial Note (Signed)
Transition of Care [facility]) - Initial/Assessment Note    Patient Details  Name: Jordan Le MRN: [id number] Date of Birth: 12/31/1945  Transition of Care [facility]) CM/SW Contact:    Ihor Gully, LCSW Phone Number: 01/27/2019, 4:28 PM  Clinical Narrative:                 Patient from home alone. Dtr., [name], lives across the street. Palliative consult placed and decision has been made to have patient referred to [facility] for home hospice. Daughter, Colletta [place], confirms.  Spoke with Cassandra at hospice and advised to place referral on Hub.  Expected Discharge Plan: Home w Hospice Care Barriers to Discharge: Continued Medical Work up   Patient Goals and CMS Choice     Choice offered to / list presented to : Adult Children  Expected Discharge Plan and Services Expected Discharge Plan: [place] Acute Care Choice: Hospice Living arrangements for the past 2 months: Single Family Home                                      Prior Living Arrangements/Services Living arrangements for the past 2 months: Single Family Home Lives with:: Self Patient language and need for interpreter reviewed:: Yes Do you feel safe going back to the place where you live?: Yes      Need for Family Participation in Patient Care: Yes (Comment) Care giver support system in place?: Yes (comment)   Criminal Activity/Legal Involvement Pertinent to Current Situation/Hospitalization: No - Comment as needed  Activities of Daily Living Home Assistive Devices/Equipment: None ADL Screening (condition at time of admission) Patient's cognitive ability adequate to safely complete daily activities?: No Is the patient deaf or have difficulty hearing?: No Does the patient have difficulty seeing, even when wearing glasses/contacts?: No Does the patient have difficulty concentrating, remembering, or making decisions?: No Patient able to express need for assistance with ADLs?: Yes Does  the patient have difficulty dressing or bathing?: No Independently performs ADLs?: Yes (appropriate for developmental age) Does the patient have difficulty walking or climbing stairs?: Yes Weakness of Legs: Both Weakness of Arms/Hands: None  Permission Sought/Granted            Permission granted to share info w Relationship: Dtr. Colletta [place]     Emotional Assessment Appearance:: Appears stated age   Affect (typically observed): Appropriate Orientation: : Oriented to Self, Oriented to Place, Oriented to  Time, Oriented to Situation Alcohol / Substance Use: Not Applicable Psych Involvement: No (comment)  Admission diagnosis:  Hyponatremia [E87.1] Acute encephalopathy [G93.40] Acute kidney injury ([place]) [N17.9] Chronic liver failure without hepatic coma (HCC) [K72.10] Patient Active Problem List   Diagnosis Date Noted  . Hypothyroidism 01/26/2019  . Chronic liver failure without hepatic coma ([place])   . Goals of care, counseling/discussion   . Palliative care by specialist   . DNR (do not resuscitate) discussion   . Encounter for hospice care discussion   . Type 2 diabetes mellitus ([place]) 01/25/2019  . Hyponatremia 01/25/2019  . Anemia 01/25/2019  . Vaginal lesion 01/05/2019  . Ascites   . Hepatic encephalopathy ([place])   . Lactic acidosis   . Dehydration   . Acute hepatic encephalopathy 12/03/2018  . Hepatitis B vaccination not up to date 11/16/2018  . SVT (supraventricular tachycardia) ([place]) 10/27/2018  . AKI (acute kidney injury) ([place])   . Anasarca   .  Pleural effusion on left   . Acute renal failure (ARF) ([place]) 10/25/2018  . Essential hypertension 10/25/2018  . SOB (shortness of breath) 10/25/2018  . Acute lower UTI 10/25/2018  . Diabetes mellitus without complication ([place])   . Cirrhosis of liver with ascites ([place])   . High cholesterol   . Mini stroke ([place])   . Endometrial cancer ([place]) 05/07/2018  . Urinary tract infection 05/07/2018   PCP:  Manon Hilding,  MD Pharmacy:   [facility], [place] [address] [place] [address] Phone: [phone] Fax: [phone]     Social Determinants of Health (SDOH) Interventions    Readmission Risk Interventions Readmission Risk Prevention Plan 12/08/2018 12/04/2018  Transportation Screening - Complete  HRI or [place] - Complete  Social Work Consult for [place] Planning/Counseling - Complete  Palliative Care Screening - Not Complete  Medication Review Press photographer) - Complete  PCP or Specialist appointment within 3-5 days of discharge Complete -  [place] or Home Care Consult Complete -  SW Recovery Care/Counseling Consult Complete -  Palliative Care Screening Not Applicable -  [place] Not Applicable -  Some recent data might be hidden

## 2019-01-27 NOTE — Progress Notes (Signed)
Patient Demographics:    Rion Schnitzer, is a 73 y.o. female, DOB - [birth year], GYB:[id number]  Admit date - 01/25/2019   Admitting Physician Reubin Milan, MD  Outpatient Primary MD for the patient is [name], [name], MD  LOS - 1   Chief Complaint  Patient presents with  . Weakness        Subjective:    Patterson Hammersmith today has no fevers, no emesis,  No chest pain,    MELD score this admission- 31 points , 53 % 3 month Mortality  -Awake and talkative, Denies significant pain   Assessment  & Plan :    Principal Problem:   AKI (acute kidney injury) ([place]) Active Problems:   Essential hypertension   Acute hepatic encephalopathy   Hepatic encephalopathy ([place])   Type 2 diabetes mellitus ([place])   Hyponatremia   Anemia   Hypothyroidism   Chronic liver failure without hepatic coma ([place])   Goals of care, counseling/discussion   Palliative care by specialist   DNR (do not resuscitate) discussion   Encounter for hospice care discussion  Brief History: 73 year old female with  DM2,  endometrial cancer (status post hysterectomy February 202)  HTN/HLD, history of TIA, CKD III, NASH liver cirrhosis  admitted on 01/25/2019 with  hyponatremia, hepatic encephalopathy and AKI on CKD III   Assessment/Plan:  1)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--- --Hepatitis B surface antigen--neg -Hepatitis C antibody--neg -Antimitochondrial antibody negative -Anti-smooth muscle antibody negative -Alpha-1 antitrypsin level--normal -Alpha-fetoprotein pending -ANA negative --Scheduled for palliative/therapeutic paracentesis with albumin transfusion on 01/27/2019 -Not compliant with lactulose at home, -Ammonia on admission was 131, down to 56 now baseline usually in the 40s -Awaiting further improvement in mental status so oral intake can be more reliable -MELD score this admission- 31  points , 53 % 3 month Mortality   2)AKI----acute kidney injury on CKD stage - III-suspect due to decreased oral intake in the setting of increased lethargy compounded by some component of hepatorenal syndrome -   creatinine on admission=  1.94,   baseline creatinine =1.2 to 1.3    , creatinine is now= 1.70 , renally adjust medications, avoid nephrotoxic agents / dehydration /hypotension -Continue IV fluids especially in the setting of lactulose use with GI losses  3)H/o SVT/Atrial tachycardia -Echo from 11/12/2018 with EF over 65%,- -TSH 5.4 -Consider propranolol or atenolol if tachycardia persist  4)H/o DM2  -10/26/18 Hemoglobin A1c--4.7--putting her at risk for hypoglycemia --Discontinued metformin - 5)Hypothyroidism -TSH is 5.4 -Continue Synthroid  6)H/o Endometrial Carcinoma -Status post hysterectomy 05/07/2018 -follow up Dr. Denman George  7)Social/Ethics- family conference with Palliative care and pt's 2 daughters----  Discussed with daughters x 2 (Ms Shirley Friar- [phone]) and Nancie Neas--- [phone]) Pt is a DNR,  Palliative care consult appreciated   Family Communication:Discussed with daughters x 2 (Ms Shirley Friar- [phone]) and Nancie Neas--- [phone])  Consultants:Palliative care  Code Status: DNR  DVT Prophylaxis: SCD  Disposition/Need for in-Hospital Stay- patient unable to be discharged at this time due to --- requiring IV fluids due to GI losses induced by lactulose -Awaiting further improvement in mental status so oral intake can be more reliable  Disposition Plan  : TBD  Lab Results  Component Value Date   PLT  178 01/27/2019    Inpatient Medications  Scheduled Meds: . diltiazem  120 mg Oral Daily  . docusate sodium  100 mg Oral Daily  . feeding supplement (ENSURE ENLIVE)  237 mL Oral Q1400  . lactulose  30 g Oral TID  . levothyroxine  75 mcg Oral QAC breakfast  . pantoprazole  40 mg Oral Daily  .  zinc sulfate  220 mg Oral q morning - 10a   Continuous Infusions: . sodium chloride 75 mL/hr at 01/27/19 0234  . albumin human     PRN Meds:.prochlorperazine    Anti-infectives (From admission, onward)   None        Objective:   Vitals:   01/26/19 1324 01/26/19 2132 01/26/19 2135 01/27/19 0438  BP: (!) 118/50 (!) 121/53  (!) 120/49  Pulse: 91 (!) 116 95 93  Resp: 18 20  20   Temp: 98.7 F (37.1 C) 98.5 F (36.9 C)  98.7 F (37.1 C)  TempSrc: Oral Oral  Oral  SpO2: 97% 96%  96%  Weight:      Height:        Wt Readings from Last 3 Encounters:  01/26/19 83.5 kg  01/04/19 79.8 kg  12/30/18 83.4 kg     Intake/Output Summary (Last 24 hours) at 01/27/2019 1437 Last data filed at 01/27/2019 0300 Gross per 24 hour  Intake 1341.12 ml  Output 400 ml  Net 941.12 ml    Physical Exam  Gen:- Awake Alert, resting comfortably, chronically ill-appearing HEENT:- [place].AT,   Neck-Supple Neck,No JVD,.  Lungs-diminished in bases, no wheezing  CV- S1, S2 normal, regular  Abd-  +ve B.Sounds, distended consistent with ascites Extremity/Skin:- 2 +  edema, pedal pulses present  Psych-more awake, coherent neuro-generalized weakness, no new focal deficits,      Data Review:   Micro Results Recent Results (from the past 240 hour(s))  Urine Culture     Status: None   Collection Time: 01/25/19  9:12 PM   Specimen: Urine, Catheterized  Result Value Ref Range Status   Specimen Description   Final    URINE, CATHETERIZED Performed at [facility], [address], [place], [place] [address]    Special Requests   Final    NONE Performed at [facility], [address] [place], [place] [address]    Culture   Final    NO GROWTH Performed at [facility], [place] [address], [place], [place] [address]    Report Status 01/27/2019 FINAL  Final  SARS CORONAVIRUS 2 (TAT 6-24 HRS) Nasopharyngeal Nasopharyngeal Swab     Status: None   Collection Time: 01/25/19 11:39 PM   Specimen:  Nasopharyngeal Swab  Result Value Ref Range Status   SARS Coronavirus 2 NEGATIVE NEGATIVE Final    Comment: (NOTE) SARS-CoV-2 target nucleic acids are NOT DETECTED. The SARS-CoV-2 RNA is generally detectable in upper and lower respiratory specimens during the acute phase of infection. Negative results do not preclude SARS-CoV-2 infection, do not rule out co-infections with other pathogens, and should not be used as the sole basis for treatment or other patient management decisions. Negative results must be combined with clinical observations, patient history, and epidemiological information. The expected result is Negative. Fact Sheet for Patients: SugarRoll.be Fact Sheet for Healthcare Providers: https://www.woods-mathews.com/ This test is not yet approved or cleared by the [place] FDA and  has been authorized for detection and/or diagnosis of SARS-CoV-2 by FDA under an Emergency Use Authorization (EUA). This EUA will remain  in  effect (meaning this test can be used) for the duration of the COVID-19 declaration under Section 56 4(b)(1) of the Act, 21 U.S.C. section 360bbb-3(b)(1), unless the authorization is terminated or revoked sooner. Performed at [facility], [place] [address], [place], [place] [address]     Radiology Reports US Paracentesis  Result Date: 01/13/2019 INDICATION: Cirrhosis Ascites EXAM: ULTRASOUND GUIDED RLQ PARACENTESIS MEDICATIONS: 10 cc 1% lidocaine COMPLICATIONS: None immediate. PROCEDURE: Informed written consent was obtained from the patient after a discussion of the risks, benefits and alternatives to treatment. A timeout was performed prior to the initiation of the procedure. Initial ultrasound scanning demonstrates a large amount of ascites within the right lower abdominal quadrant. The right lower abdomen was prepped and draped in the usual sterile fashion. 1% lidocaine was used for local anesthesia.  Following this, a 7 cm Yeuh catheter was introduced. An ultrasound image was saved for documentation purposes. The paracentesis was performed. The catheter was removed and a dressing was applied. The patient tolerated the procedure well without immediate post procedural complication. Patient received post-procedure intravenous albumin; see nursing notes for details. FINDINGS: A total of approximately 6.2 Liters of reddish fluid was removed. Samples were sent to the laboratory as requested by the clinical team. IMPRESSION: Successful ultrasound-guided paracentesis yielding 6.2 liters of peritoneal fluid. Read by Lavonia Drafts [facility] Electronically Signed   By: Lavonia Dana M.D.   On: 01/13/2019 10:14   US Paracentesis  Result Date: 01/01/2019 INDICATION: Cirrhosis, ascites EXAM: ULTRASOUND GUIDED DIAGNOSTIC AND THERAPEUTIC PARACENTESIS MEDICATIONS: None COMPLICATIONS: None immediate PROCEDURE: Informed written consent was obtained from the patient after a discussion of the risks, benefits and alternatives to treatment. A timeout was performed prior to the initiation of the procedure. Initial ultrasound scanning demonstrates a large amount of ascites within the right lower abdominal quadrant. The right lower abdomen was prepped and draped in the usual sterile fashion. 1% lidocaine was used for local anesthesia. Following this, a 5 [place] Yueh catheter was introduced. An ultrasound image was saved for documentation purposes. The paracentesis was performed. The catheter was removed and a dressing was applied. The patient tolerated the procedure well without immediate post procedural complication. Patient received post-procedure intravenous albumin; see nursing notes for details. FINDINGS: A total of approximately 4.2 L of amber colored ascitic fluid was removed. Samples were sent to the laboratory as requested by the clinical team. IMPRESSION: Successful ultrasound-guided paracentesis yielding 4.2 liters of peritoneal  fluid. Electronically Signed   By: Lavonia Dana M.D.   On: 01/01/2019 13:31     CBC Recent Labs  Lab 01/25/19 2127 01/26/19 0458 01/27/19 0421  WBC 11.9* 10.9* 13.7*  HGB 9.7* 8.8* 8.3*  HCT 28.9* 26.6* 25.0*  PLT 219 184 178  MCV 94.1 95.3 95.4  MCH 31.6 31.5 31.7  MCHC 33.6 33.1 33.2  RDW 16.2* 16.4* 16.3*  LYMPHSABS 0.8 1.2 1.2  MONOABS 1.2* 1.6* 2.9*  EOSABS 0.1 0.2 0.5  BASOSABS 0.1 0.1 0.1    Chemistries  Recent Labs  Lab 01/25/19 2127 01/26/19 0458 01/27/19 0421  NA 125* 130* 131*  K 5.1 4.6 4.2  CL 92* 100 100  CO2 20* 21* 19*  GLUCOSE 182* 142* 129*  BUN 43* 44* 46*  CREATININE 1.94* 1.74* 1.70*  CALCIUM 8.5* 8.4* 8.6*  AST 76* 63* 48*  ALT 55* 48* 42  ALKPHOS 135* 128* 117  BILITOT 3.4* 3.7* 3.8*   ------------------------------------------------------------------------------------------------------------------ No results for input(s): CHOL, HDL, LDLCALC, TRIG, CHOLHDL, LDLDIRECT  in the last 72 hours.  Lab Results  Component Value Date   HGBA1C 4.7 (L) 10/26/2018   ------------------------------------------------------------------------------------------------------------------ No results for input(s): TSH, T4TOTAL, T3FREE, THYROIDAB in the last 72 hours.  Invalid input(s): FREET3 ------------------------------------------------------------------------------------------------------------------ No results for input(s): VITAMINB12, FOLATE, FERRITIN, TIBC, IRON, RETICCTPCT in the last 72 hours.  Coagulation profile Recent Labs  Lab 01/25/19 2127  INR 1.9*    No results for input(s): DDIMER in the last 72 hours.  Cardiac Enzymes No results for input(s): CKMB, TROPONINI, MYOGLOBIN in the last 168 hours.  Invalid input(s): CK ------------------------------------------------------------------------------------------------------------------    Component Value Date/Time   BNP 48.0 01/25/2019 2127    Roxan Hockey M.D on 01/27/2019 at 2:37 PM   Go to [email or web address] - for contact info  [facility] - Office  [phone]

## 2019-01-28 ENCOUNTER — Encounter (HOSPITAL_COMMUNITY): Payer: Self-pay | Admitting: Gastroenterology

## 2019-01-28 DIAGNOSIS — K7460 Unspecified cirrhosis of liver: Secondary | ICD-10-CM

## 2019-01-28 LAB — COMPREHENSIVE METABOLIC PANEL
ALT: 33 U/L (ref 0–44)
AST: 39 U/L (ref 15–41)
Albumin: 2.3 g/dL — ABNORMAL LOW (ref 3.5–5.0)
Alkaline Phosphatase: 102 U/L (ref 38–126)
Anion gap: 8 (ref 5–15)
BUN: 49 mg/dL — ABNORMAL HIGH (ref 8–23)
CO2: 19 mmol/L — ABNORMAL LOW (ref 22–32)
Calcium: 8 mg/dL — ABNORMAL LOW (ref 8.9–10.3)
Chloride: 102 mmol/L (ref 98–111)
Creatinine, Ser: 1.71 mg/dL — ABNORMAL HIGH (ref 0.44–1.00)
GFR calc Af Amer: 34 mL/min — ABNORMAL LOW (ref >60)
GFR calc non Af Amer: 29 mL/min — ABNORMAL LOW (ref >60)
Glucose, Bld: 129 mg/dL — ABNORMAL HIGH (ref 70–99)
Potassium: 4.2 mmol/L (ref 3.5–5.1)
Sodium: 129 mmol/L — ABNORMAL LOW (ref 135–145)
Total Bilirubin: 3.1 mg/dL — ABNORMAL HIGH (ref 0.3–1.2)
Total Protein: 5.3 g/dL — ABNORMAL LOW (ref 6.5–8.1)

## 2019-01-28 LAB — CBC WITH DIFFERENTIAL/PLATELET
Abs Immature Granulocytes: 0.05 10*3/uL (ref 0.00–0.07)
Basophils Absolute: 0.1 10*3/uL (ref 0.0–0.1)
Basophils Relative: 1 %
Eosinophils Absolute: 0.7 10*3/uL — ABNORMAL HIGH (ref 0.0–0.5)
Eosinophils Relative: 7 %
HCT: 24.2 % — ABNORMAL LOW (ref 36.0–46.0)
Hemoglobin: 7.9 g/dL — ABNORMAL LOW (ref 12.0–15.0)
Immature Granulocytes: 1 %
Lymphocytes Relative: 9 %
Lymphs Abs: 0.9 10*3/uL (ref 0.7–4.0)
MCH: 30.6 pg (ref 26.0–34.0)
MCHC: 32.6 g/dL (ref 30.0–36.0)
MCV: 93.8 fL (ref 80.0–100.0)
Monocytes Absolute: 1.9 10*3/uL — ABNORMAL HIGH (ref 0.1–1.0)
Monocytes Relative: 19 %
Neutro Abs: 6.3 10*3/uL (ref 1.7–7.7)
Neutrophils Relative %: 63 %
Platelets: 146 10*3/uL — ABNORMAL LOW (ref 150–400)
RBC: 2.58 MIL/uL — ABNORMAL LOW (ref 3.87–5.11)
RDW: 15.9 % — ABNORMAL HIGH (ref 11.5–15.5)
WBC: 10 10*3/uL (ref 4.0–10.5)
nRBC: 0 % (ref 0.0–0.2)

## 2019-01-28 MED ORDER — DEXTROSE-NACL 5-0.45 % IV SOLN
INTRAVENOUS | Status: DC
  Administered 2019-01-28: 15:00:00 via INTRAVENOUS

## 2019-01-28 MED ORDER — LACTULOSE 10 GM/15ML PO SOLN
30.0000 g | Freq: Two times a day (BID) | ORAL | Status: DC
  Administered 2019-01-28 – 2019-01-30 (×4): 30 g via ORAL
  Filled 2019-01-28 (×4): qty 60

## 2019-01-28 MED ORDER — PHYTONADIONE 5 MG PO TABS
2.5000 mg | ORAL_TABLET | Freq: Once | ORAL | Status: AC
  Administered 2019-01-28: 2.5 mg via ORAL
  Filled 2019-01-28: qty 1

## 2019-01-28 NOTE — Progress Notes (Signed)
Patient Demographics:    Jordan Le, is a 73 y.o. female, DOB - 07/28/1945, YJE:[id number]  Admit date - 01/25/2019   Admitting Physician Reubin Milan, MD  Outpatient Primary MD for the patient is [name], [name], MD  LOS - 2   Chief Complaint  Patient presents with   Weakness        Subjective:    [place] today has no fevers, no emesis,   No chest pain,   -Resting comfortably Oral intake is not great -Had multiple BMs   Assessment  & Plan :    Principal Problem:   AKI (acute kidney injury) ([place]) Active Problems:   Essential hypertension   Acute hepatic encephalopathy   Hepatic encephalopathy (HCC)   Type 2 diabetes mellitus (HCC)   Hyponatremia   Anemia   Hypothyroidism   Chronic liver failure without hepatic coma ([place])   Goals of care, counseling/discussion   Palliative care by specialist   DNR (do not resuscitate) discussion   Encounter for hospice care discussion  Brief History: 73 year old female with  DM2,  endometrial cancer (status post hysterectomy February 202)  HTN/HLD, history of TIA, CKD III, NASH liver cirrhosis  admitted on 01/25/2019 with  hyponatremia, hepatic encephalopathy and AKI on CKD III   Assessment/Plan:  1)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--- --Hepatitis B surface antigen--neg -Hepatitis C antibody--neg -Antimitochondrial antibody negative -Anti-smooth muscle antibody negative -Alpha-1 antitrypsin level--normal -Alpha-fetoprotein pending -ANA negative --Scheduled for palliative/therapeutic paracentesis with albumin transfusion on 01/27/2019 -Not compliant with lactulose at home, -Ammonia on admission was 131, down to 56 now baseline usually in the 40s -Decrease lactulose to 30 g twice daily -MELD score this admission- 31 points , 53 % 3 month Mortality -Had paracentesis on 01/27/2019 with removal of 8.1 L of  fluid -Interventional radiology consult for drainage catheter/Pleurx tunneled catheter-placement due to recurrent ascites requiring frequent paracentesis -d/w Dr Markus Daft   2)AKI----acute kidney injury on CKD stage - III-suspect due to decreased oral intake in the setting of increased lethargy compounded by some component of hepatorenal syndrome -   creatinine on admission=  1.94,   baseline creatinine =1.2 to 1.3    , creatinine is now= 1.71 , renally adjust medications, avoid nephrotoxic agents / dehydration /hypotension -Continue IV fluids especially in the setting of lactulose use with GI losses  3)H/o SVT/Atrial tachycardia -Echo from 11/12/2018 with EF over 65%,- -TSH 5.4 -Consider propranolol or atenolol if tachycardia persist  4)H/o DM2  -10/26/18 Hemoglobin A1c--4.7--putting her at risk for hypoglycemia --Discontinued metformin - 5)Hypothyroidism -TSH is 5.4 -Continue Synthroid  6)H/o Endometrial Carcinoma -Status post hysterectomy 05/07/2018 -follow up Dr. Denman George  7)Social/Ethics- family conference with Palliative care and pt's 2 daughters----  Discussed with daughters x 2 (Ms Shirley Friar- [phone]) and Nancie Neas--- [phone]) Pt is a DNR,  Palliative care consult appreciated -Patient plans to enroll in hospice services in the near future, she needs Pleurx peritoneal catheter placed to avoid repeated trips to the hospital for paracentesis  8)Anemia of chronic disease --- in the setting of liver cirrhosis --Hgb currently 7.9, continue to monitor and transfuse as clinically indicated  Family Communication:Discussed with daughters x 2 (Ms Shirley Friar- [phone]) and Nancie Neas--- [phone])  Consultants:Palliative care/IR/GI  Code Status: DNR  DVT Prophylaxis: SCD  Disposition/Need for in-Hospital Stay- patient unable to be discharged at this time due to --- requiring IV fluids due to GI losses induced by  lactulose -Interventional radiology consult for drainage catheter/Pleurx tunneled catheter-placement due to recurrent ascites requiring frequent paracentesis -d/w Dr Markus Daft  Disposition Plan  : TBD  Lab Results  Component Value Date   PLT 146 (L) 01/28/2019    Inpatient Medications  Scheduled Meds:  diltiazem  120 mg Oral Daily   docusate sodium  100 mg Oral Daily   feeding supplement (ENSURE ENLIVE)  237 mL Oral Q1400   lactulose  30 g Oral BID   levothyroxine  75 mcg Oral QAC breakfast   pantoprazole  40 mg Oral Daily   zinc sulfate  220 mg Oral q morning - 10a   Continuous Infusions:  sodium chloride 75 mL/hr at 01/27/19 1705   PRN Meds:.prochlorperazine    Anti-infectives (From admission, onward)   None        Objective:   Vitals:   01/27/19 2113 01/28/19 0548 01/28/19 0806 01/28/19 1112  BP: (!) 100/45 (!) 107/48  (!) 108/44  Pulse: 94 90    Resp: 17 18    Temp: 98.2 F (36.8 C) 98.3 F (36.8 C)    TempSrc:      SpO2: 100% 98% 95%   Weight:      Height:        Wt Readings from Last 3 Encounters:  01/26/19 83.5 kg  01/04/19 79.8 kg  12/30/18 83.4 kg     Intake/Output Summary (Last 24 hours) at 01/28/2019 1142 Last data filed at 01/28/2019 0400 Gross per 24 hour  Intake 1677.33 ml  Output --  Net 1677.33 ml    Physical Exam  Gen:- Awake Alert, resting comfortably, chronically ill-appearing HEENT:- [place].AT,   Neck-Supple Neck,No JVD,.  Lungs-diminished in bases, no wheezing  CV- S1, S2 normal, regular  Abd-  +ve B.Sounds, much less distended after paracentesis Extremity/Skin:- 2 +  edema, pedal pulses present  Psych-more awake, coherent neuro-generalized weakness, no new focal deficits,      Data Review:   Micro Results Recent Results (from the past 240 hour(s))  Urine Culture     Status: None   Collection Time: 01/25/19  9:12 PM   Specimen: Urine, Catheterized  Result Value Ref Range Status   Specimen Description   Final     URINE, CATHETERIZED Performed at [facility], [address], [place], [place] [address]    Special Requests   Final    NONE Performed at [facility], [address] [place], [place] [address]    Culture   Final    NO GROWTH Performed at [facility], [place] [address] [place], [place] [address]    Report Status 01/27/2019 FINAL  Final  SARS CORONAVIRUS 2 (TAT 6-24 HRS) Nasopharyngeal Nasopharyngeal Swab     Status: None   Collection Time: 01/25/19 11:39 PM   Specimen: Nasopharyngeal Swab  Result Value Ref Range Status   SARS Coronavirus 2 NEGATIVE NEGATIVE Final    Comment: (NOTE) SARS-CoV-2 target nucleic acids are NOT DETECTED. The SARS-CoV-2 RNA is generally detectable in upper and lower respiratory specimens during the acute phase of infection. Negative results do not preclude SARS-CoV-2 infection, do not rule out co-infections with other pathogens, and should not be used as the sole basis for treatment or other patient management decisions. Negative results must be combined with clinical observations,  patient history, and epidemiological information. The expected result is Negative. Fact Sheet for Patients: SugarRoll.be Fact Sheet for Healthcare Providers: https://www.woods-mathews.com/ This test is not yet approved or cleared by the [place] FDA and  has been authorized for detection and/or diagnosis of SARS-CoV-2 by FDA under an Emergency Use Authorization (EUA). This EUA will remain  in effect (meaning this test can be used) for the duration of the COVID-19 declaration under Section 56 4(b)(1) of the Act, 21 U.S.C. section 360bbb-3(b)(1), unless the authorization is terminated or revoked sooner. Performed at [facility], [place] [address] [place], [place] [address]     Radiology Reports US Paracentesis  Result Date: 01/27/2019 INDICATION: Ascites EXAM: ULTRASOUND GUIDED THERAPEUTIC PARACENTESIS  MEDICATIONS: None COMPLICATIONS: None immediate PROCEDURE: Informed written consent was obtained from the patient after a discussion of the risks, benefits and alternatives to treatment. A timeout was performed prior to the initiation of the procedure. Initial ultrasound scanning demonstrates a large amount of ascites within the right lower abdominal quadrant. The right lower abdomen was prepped and draped in the usual sterile fashion. 1% lidocaine was used for local anesthesia. Following this, a 5 [place] Yueh catheter was introduced. An ultrasound image was saved for documentation purposes. The paracentesis was performed. The catheter was removed and a dressing was applied. The patient tolerated the procedure well without immediate post procedural complication. Patient received post-procedure intravenous albumin; see nursing notes for details. FINDINGS: A total of approximately 8.1 L of yellow fluid was removed. IMPRESSION: Successful ultrasound-guided paracentesis yielding 8.1 liters of peritoneal fluid. Electronically Signed   By: Lavonia Dana M.D.   On: 01/27/2019 16:58   US Paracentesis  Result Date: 01/13/2019 INDICATION: Cirrhosis Ascites EXAM: ULTRASOUND GUIDED RLQ PARACENTESIS MEDICATIONS: 10 cc 1% lidocaine COMPLICATIONS: None immediate. PROCEDURE: Informed written consent was obtained from the patient after a discussion of the risks, benefits and alternatives to treatment. A timeout was performed prior to the initiation of the procedure. Initial ultrasound scanning demonstrates a large amount of ascites within the right lower abdominal quadrant. The right lower abdomen was prepped and draped in the usual sterile fashion. 1% lidocaine was used for local anesthesia. Following this, a 7 cm Yeuh catheter was introduced. An ultrasound image was saved for documentation purposes. The paracentesis was performed. The catheter was removed and a dressing was applied. The patient tolerated the procedure well  without immediate post procedural complication. Patient received post-procedure intravenous albumin; see nursing notes for details. FINDINGS: A total of approximately 6.2 Liters of reddish fluid was removed. Samples were sent to the laboratory as requested by the clinical team. IMPRESSION: Successful ultrasound-guided paracentesis yielding 6.2 liters of peritoneal fluid. Read by Lavonia Drafts [facility] Electronically Signed   By: Lavonia Dana M.D.   On: 01/13/2019 10:14   US Paracentesis  Result Date: 01/01/2019 INDICATION: Cirrhosis, ascites EXAM: ULTRASOUND GUIDED DIAGNOSTIC AND THERAPEUTIC PARACENTESIS MEDICATIONS: None COMPLICATIONS: None immediate PROCEDURE: Informed written consent was obtained from the patient after a discussion of the risks, benefits and alternatives to treatment. A timeout was performed prior to the initiation of the procedure. Initial ultrasound scanning demonstrates a large amount of ascites within the right lower abdominal quadrant. The right lower abdomen was prepped and draped in the usual sterile fashion. 1% lidocaine was used for local anesthesia. Following this, a 5 [place] Yueh catheter was introduced. An ultrasound image was saved for documentation purposes. The paracentesis was performed. The catheter was removed and a dressing was applied. The patient  tolerated the procedure well without immediate post procedural complication. Patient received post-procedure intravenous albumin; see nursing notes for details. FINDINGS: A total of approximately 4.2 L of amber colored ascitic fluid was removed. Samples were sent to the laboratory as requested by the clinical team. IMPRESSION: Successful ultrasound-guided paracentesis yielding 4.2 liters of peritoneal fluid. Electronically Signed   By: Lavonia Dana M.D.   On: 01/01/2019 13:31     CBC Recent Labs  Lab 01/25/19 2127 01/26/19 0458 01/27/19 0421 01/28/19 0500  WBC 11.9* 10.9* 13.7* 10.0  HGB 9.7* 8.8* 8.3* 7.9*  HCT 28.9*  26.6* 25.0* 24.2*  PLT 219 184 178 146*  MCV 94.1 95.3 95.4 93.8  MCH 31.6 31.5 31.7 30.6  MCHC 33.6 33.1 33.2 32.6  RDW 16.2* 16.4* 16.3* 15.9*  LYMPHSABS 0.8 1.2 1.2 0.9  MONOABS 1.2* 1.6* 2.9* 1.9*  EOSABS 0.1 0.2 0.5 0.7*  BASOSABS 0.1 0.1 0.1 0.1    Chemistries  Recent Labs  Lab 01/25/19 2127 01/26/19 0458 01/27/19 0421 01/28/19 0500  NA 125* 130* 131* 129*  K 5.1 4.6 4.2 4.2  CL 92* 100 100 102  CO2 20* 21* 19* 19*  GLUCOSE 182* 142* 129* 129*  BUN 43* 44* 46* 49*  CREATININE 1.94* 1.74* 1.70* 1.71*  CALCIUM 8.5* 8.4* 8.6* 8.0*  AST 76* 63* 48* 39  ALT 55* 48* 42 33  ALKPHOS 135* 128* 117 102  BILITOT 3.4* 3.7* 3.8* 3.1*   ------------------------------------------------------------------------------------------------------------------ No results for input(s): CHOL, HDL, LDLCALC, TRIG, CHOLHDL, LDLDIRECT in the last 72 hours.  Lab Results  Component Value Date   HGBA1C 4.7 (L) 10/26/2018   ------------------------------------------------------------------------------------------------------------------ No results for input(s): TSH, T4TOTAL, T3FREE, THYROIDAB in the last 72 hours.  Invalid input(s): FREET3 ------------------------------------------------------------------------------------------------------------------ No results for input(s): VITAMINB12, FOLATE, FERRITIN, TIBC, IRON, RETICCTPCT in the last 72 hours.  Coagulation profile Recent Labs  Lab 01/25/19 2127  INR 1.9*    No results for input(s): DDIMER in the last 72 hours.  Cardiac Enzymes No results for input(s): CKMB, TROPONINI, MYOGLOBIN in the last 168 hours.  Invalid input(s): CK ------------------------------------------------------------------------------------------------------------------    Component Value Date/Time   BNP 48.0 01/25/2019 2127    Roxan Hockey M.D on 01/28/2019 at 11:42 AM  Go to [email or web address] - for contact info  [facility] - Office   [phone]

## 2019-01-28 NOTE — Procedures (Signed)
PreOperative Dx: Cirrhosis, ascites Postoperative Dx: Cirrhosis, ascites Procedure:   US guided paracentesis Radiologist:  Thornton Papas Anesthesia:  10 ml of1% lidocaine Specimen:  8.1 L of yellow ascitic fluid EBL:   < 1 ml Complications: None

## 2019-01-28 NOTE — Plan of Care (Signed)

## 2019-01-28 NOTE — TOC Progression Note (Signed)
Transition of Care [facility]) - Progression Note    Patient Details  Name: PREETHI SCANTLEBURY MRN: [id number] Date of Birth: 1945-12-27  Transition of Care [facility]) CM/SW Contact  Ihor Gully, LCSW Phone Number: 01/28/2019, 12:46 PM  Clinical Narrative:    Ron at [facility] is unable to get in the [employer]. Referral routed via [place] fax function to [phone].   Expected Discharge Plan: Home w Hospice Care Barriers to Discharge: Continued Medical Work up  Expected Discharge Plan and Services Expected Discharge Plan: [place] Acute Care Choice: Hospice Living arrangements for the past 2 months: Single Family Home                                       Social Determinants of Health (SDOH) Interventions    Readmission Risk Interventions Readmission Risk Prevention Plan 12/08/2018 12/04/2018  Transportation Screening - Complete  HRI or [place] - Complete  Social Work Consult for [place] Planning/Counseling - Complete  Palliative Care Screening - Not Complete  Medication Review Press photographer) - Complete  PCP or Specialist appointment within 3-5 days of discharge Complete -  [place] or Home Care Consult Complete -  SW Recovery Care/Counseling Consult Complete -  Palliative Care Screening Not Applicable -  [place] Not Applicable -  Some recent data might be hidden

## 2019-01-28 NOTE — Clinical Social Work Note (Signed)
Patient Information  Patient Name  Twylah, Bennetts ([id number]) Sex  Female DOB  12/09/1945 SSN [id number]  Room Bed  A313 A313-01  Patient Demographics  Address  [place] [address] Phone  [phone] [facility])  [phone] (Mobile) E-mail Address  [email or web address]  Patient Ethnicity & Race  Ethnic Group Patient Race  Not Hispanic or Latino White or Caucasian  Emergency Contact(s)  Name Relation Home Work Mobile  [place] Daughter [phone]  [phone]  Merrick,Stepanie Daughter   [phone]  Documents on File   Status Date Received Description  Documents for the Patient  [place] Not Received    [place] E-Signature HIPAA Notice of Privacy Signed 57/32/20   Driver's License Received 25/42/70 EXP6/26/24  Insurance Card Received 04/28/18 HTA/GTL/MC--[facility]  Advance Directives/Living Will/HCPOA/POA Not Received    Other Photo ID Not Received    Release of Information Received 04/28/18 ROI--[facility]  AMB Referral Received 06/09/18 [facility] Received 11/06/18 [facility]  HIM ROI Authorization  11/20/18 [place] MD  AMB Outside Hospital Record Received 12/15/18 [place]  Consent Form Received 01/01/19 paracentesis  AMB Correspondence Received 01/15/19 LETTER [place]IXAR  Patient Photo   Photo of Patient  HIM Release of Information Output  11/09/18   HIM Release of Information Output  11/09/18   HIM Release of Information Output  11/20/18 Requested records  HIM Release of Information Output  11/23/18 Requested records  HIM Release of Information Output  11/24/18 Requested records  HIM Release of Information Output  01/06/19 Requested records  Documents for the Encounter  AOB (Assignment of Insurance Benefits) Not Received    E-signature AOB Signed 01/25/19   MEDICARE RIGHTS Not Received    E-signature Medicare Rights  Signed 01/25/19   ED Patient Billing Extract   ED PB Billing Extract  Cardiac Monitoring Strip Received 01/26/19   Cardiac Monitoring Strip Shift Summary Received 01/26/19   Medicare Observation  01/26/19   Consent Form Received 01/27/19   Admission Information  Current Information  Attending Provider Admitting Provider Admission Type Admission Status  Roxan Hockey, MD Reubin Milan, MD Emergency Admission (Confirmed)       Admission Date/Time Discharge Date Hospital Service Auth/Cert Status  62/37/62 08:58 PM  Internal Medicine [place] Unit Room/Bed   [facility] AP-DEPT 300 A313/A313-01        Admission  Complaint  abd pain  Hospital Account  Name Acct ID Class Status Primary Coverage  Geral, Tuch [id number] Inpatient Open [place] PPO      Guarantor Account (for [place] [id number])  Name Relation to Pt Service Area Active? Acct Type  [name] Self [facility]SA Yes Personal/Family  Address Phone    [place], [place] [address] [phone])        Coverage Information (for Hospital Account [id number])  F/O Payor/Plan Precert #  [facility] ADVANTAGE/[employer] ADVANTAGE PPO   Subscriber Subscriber #  Roisin, Mones [id number]  Address [place]  [address] [address] [phone]       Care Everywhere ID:  [id number]

## 2019-01-28 NOTE — Progress Notes (Addendum)
IR consulted for abdominal PleurX placement in patient with recurrent ascites and end stage liver disease.   Case reviewed and approved by Dr. Anselm Pancoast.   Patient not on blood thinners.  Auto-anticogulation suspected as INR in 1.9 in the setting of end stage liver disease, however will given a dose of Vitamin K overnight and plan to proceed tomorrow.   Patient to arrive at [facility] tomorrow at 11:30 for procedure.  Plan to return to [facility] for recovery and discharge.  Spoke with Lattie Haw to arrange.  Spoke with Colletta [place] (daughter) to obtain consent.   Risks and benefits discussed with the patient including bleeding, infection, damage to adjacent structures.  All of the patient's questions were answered, patient is agreeable to proceed. Consent signed and in chart.

## 2019-01-28 NOTE — Consult Note (Signed)
Referring Provider: Roxan Hockey, MD Primary Care Physician:  Manon Hilding, MD Primary Gastroenterologist:  Barney Drain, MD  Reason for Consultation:  Cirrhosis/ascites  HPI: Jordan Le is a 72 y.o. 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  Reported poor oral intake.  Initial labs while in the ED showed hemoglobin of 9.7 down from 11.2 three weeks ago.  Platelet count 219,000.  White blood cell count 11,900.  Sodium 125 down from 131 one month ago.  BUN 43, creatinine 1.94 increased from 1.2 to 72-monthago.  Albumin down to 2.4, new elevation of AST and ALT of 76 and 55 respectively, alk phos 135, total bilirubin 3.4.  Ammonia level up from 44 to 131.  INR 1.9.  Patient was started on lactulose. Reportedly at home not taking regularly. Xifaxan too expensive at $825/month and we are working on tier exception as outpatient.  Her ammonia is down to 56 on November 3.  White blood cell count down to 10,000.  Hemoglobin down to 7.9, platelets of 146,000.  Sodium up to 129.  Potassium 4.2.  Creatinine 1.71, BUN 49, albumin 2.3, AST 39, ALT 33, alk phos 102, total bilirubin 3.1.  Patient has been requiring increased frequency of abdominal paracenteses.  Her initial 1 was on August 3.  She had 3 in September.  The tap was performed on October 9, October 21 and the last 1 on November 4 (fluid was not sent for any analysis).  No previous SBP.  Patient states at home she has been very weak.  She has had a lot of issues with recurrent abdominal swelling.  No real abdominal pain.  Appetite has been down.  Eating only a few bites at a time.  No complaints of constipation or diarrhea.  No melena or rectal bleeding.  She is interested in pursuing Pleurx drain to help manage her ascites.  She states she  does not feel like she can go in once weekly to have abdominal taps done.  She voices her understanding that this can increase her risk of infection along with sepsis and death.   Prior to Admission medications   Medication Sig Start Date End Date Taking? Authorizing Provider  atorvastatin (LIPITOR) 10 MG tablet Take 1 tablet (10 mg total) by mouth daily at 6 PM. 11/05/18  Yes Emokpae, Courage, MD  diltiazem (CARDIZEM CD) 120 MG 24 hr capsule Take 1 capsule (120 mg total) by mouth daily. 11/05/18 11/05/19 Yes Emokpae, Courage, MD  docusate sodium (COLACE) 100 MG capsule Take 1 capsule (100 mg total) by mouth daily. 12/08/18  Yes Shah, Pratik D, DO  feeding supplement, ENSURE ENLIVE, (ENSURE ENLIVE) LIQD Take 237 mLs by mouth 2 (two) times daily between meals. Patient taking differently: Take 237 mLs by mouth daily at 2 PM.  11/05/18  Yes Emokpae, Courage, MD  lactulose (CHRONULAC) 10 GM/15ML solution Take 30 mLs (20 g total) by mouth 3 (three) times daily. Patient taking differently: Take 20 g by mouth 2 (two) times daily as needed for moderate constipation.  12/08/18  Yes SManuella Ghazi Pratik D, DO  levothyroxine (SYNTHROID) 75 MCG tablet Take 75 mcg by mouth daily before breakfast.  10/05/18 10/30/19 Yes [provider]  Multiple Vitamin (MULTIVITAMIN WITH MINERALS) TABS tablet Take 1 tablet by mouth daily. 11/06/18  Yes ERoxan Hockey MD  omeprazole (PRILOSEC) 20  MG capsule 1 PO 30 MINS PRIOR TO BREAKFAST. Patient taking differently: Take 20 mg by mouth daily before breakfast. 1 PO 30 MINS PRIOR TO BREAKFAST. 11/23/18  Yes Fields, Sandi L, MD  Potassium 99 MG TABS Take 1 tablet by mouth daily.   Yes [provider]  sennosides-docusate sodium (SENOKOT-S) 8.6-50 MG tablet 1 PO BID Patient taking differently: Take 1 tablet by mouth 2 (two) times daily.  01/07/19  Yes Fields, Sandi L, MD  spironolactone (ALDACTONE) 50 MG tablet Take 1 tablet (50 mg total) by mouth daily. 12/08/18 01/25/19 Yes Shah,  Pratik D, DO  torsemide (DEMADEX) 20 MG tablet Take 1 tablet (20 mg total) by mouth daily. 12/08/18 01/25/19 Yes Shah, Pratik D, DO  zinc sulfate 220 (50 Zn) MG capsule Take 1 capsule (220 mg total) by mouth 3 (three) times daily. Patient taking differently: Take 220 mg by mouth every morning.  01/07/19  Yes Carlis Stable, NP  levOCARNitine, Dietary, 330 MG TABS Take 330 mg by mouth 3 (three) times daily. Patient not taking: Reported on 01/25/2019 01/07/19   Carlis Stable, NP  nystatin (MYCOSTATIN/NYSTOP) powder Apply topically 3 (three) times daily. Patient not taking: Reported on 01/25/2019 11/05/18   Roxan Hockey, MD  rifaximin (XIFAXAN) 550 MG TABS tablet Take 1 tablet (550 mg total) by mouth 2 (two) times daily. Patient not taking: Reported on 01/25/2019 01/07/19 02/06/19  Danie Binder, MD    Current Facility-Administered Medications  Medication Dose Route Frequency Provider Last Rate Last Dose  . 0.9 %  sodium chloride infusion   Intravenous Continuous Denton Brick, Courage, MD 75 mL/hr at 01/27/19 1705    . diltiazem (CARDIZEM CD) 24 hr capsule 120 mg  120 mg Oral Daily Reubin Milan, MD   120 mg at 01/27/19 1013  . docusate sodium (COLACE) capsule 100 mg  100 mg Oral Daily Reubin Milan, MD   100 mg at 01/27/19 1013  . feeding supplement (ENSURE ENLIVE) (ENSURE ENLIVE) liquid 237 mL  237 mL Oral Q1400 Reubin Milan, MD      . lactulose [facility]) 10 GM/15ML solution 30 g  30 g Oral TID Reubin Milan, MD   30 g at 01/27/19 2112  . levothyroxine (SYNTHROID) tablet 75 mcg  75 mcg Oral QAC breakfast Reubin Milan, MD   75 mcg at 01/28/19 0526  . pantoprazole (PROTONIX) EC tablet 40 mg  40 mg Oral Daily Reubin Milan, MD   40 mg at 01/27/19 1013  . prochlorperazine (COMPAZINE) injection 5 mg  5 mg Intravenous Q4H PRN Reubin Milan, MD   5 mg at 01/26/19 0149  . zinc sulfate capsule 220 mg  220 mg Oral q morning - 10a Reubin Milan, MD   220 mg at  01/27/19 1013    Allergies as of 01/25/2019  . (No Known Allergies)    Past Medical History:  Diagnosis Date  . Arthritis   . Cirrhosis of liver ([place])   . Diabetes mellitus without complication ([place])    type 2  . Endometrial cancer ([place]) 05/07/2018  . High cholesterol   . Hypertension   . Mini stroke ([place])    2011  . Pneumonia   . Renal disorder     Past Surgical History:  Procedure Laterality Date  . ABDOMINAL HYSTERECTOMY    . BIOPSY  11/23/2018   Procedure: BIOPSY;  Surgeon: Danie Binder, MD;  Location: [facility];  Service: Endoscopy;;  .  BREAST BIOPSY Right    ~15 years ago-2005  . ESOPHAGOGASTRODUODENOSCOPY N/A 11/23/2018   Dr. Oneida Alar: MILD Portal hypertensive gastropathy.  Moderate gastritis/mild duodenitis due to aspirin.  Next surveillance EGD in 2 to 3 years.  . OTHER SURGICAL HISTORY  2009   Uterine Polyp removal  . polyps removed from the uterus    . ROBOTIC ASSISTED TOTAL HYSTERECTOMY WITH BILATERAL SALPINGO OOPHERECTOMY Bilateral 05/07/2018   Procedure: XI ROBOTIC ASSISTED TOTAL HYSTERECTOMY WITH BILATERAL SALPINGO OOPHORECTOMY PELVIC LYMPHANECTOMY;  Surgeon: Everitt Amber, MD;  Location: [name];  Service: Gynecology;  Laterality: Bilateral;  . SENTINEL NODE BIOPSY Bilateral 05/07/2018   Procedure: SENTINEL NODE BIOPSY;  Surgeon: Everitt Amber, MD;  Location: [name];  Service: Gynecology;  Laterality: Bilateral;    Family History  Problem Relation Age of Onset  . Lung cancer Mother   . Hypertension Father   . Pancreatic cancer Brother   . Liver disease Neg Hx     Social History   Socioeconomic History  . Marital status: Married    Spouse name: Not on file  . Number of children: Not on file  . Years of education: Not on file  . Highest education level: Not on file  Occupational History  . Not on file  Social Needs  . Financial resource strain: Not on file  . Food insecurity    Worry: Not on file    Inability: Not on file  . Transportation needs     Medical: Not on file    Non-medical: Not on file  Tobacco Use  . Smoking status: Never Smoker  . Smokeless tobacco: Never Used  Substance and Sexual Activity  . Alcohol use: Never    Frequency: Never  . Drug use: Never  . Sexual activity: Not Currently  Lifestyle  . Physical activity    Days per week: Not on file    Minutes per session: Not on file  . Stress: Not on file  Relationships  . Social Herbalist on phone: Not on file    Gets together: Not on file    Attends religious service: Not on file    Active member of club or organization: Not on file    Attends meetings of clubs or organizations: Not on file    Relationship status: Not on file  . Intimate partner violence    Fear of current or ex partner: Not on file    Emotionally abused: Not on file    Physically abused: Not on file    Forced sexual activity: Not on file  Other Topics Concern  . Not on file  Social History Narrative  . Not on file     ROS:  General: Negative for weight loss, fever, chills, positive for anorexia, fatigue, weakness. Eyes: Negative for vision changes.  ENT: Negative for hoarseness, difficulty swallowing , nasal congestion. CV: Negative for chest pain, angina, palpitations, dyspnea on exertion, positive peripheral edema.  Respiratory: Negative for dyspnea at rest, dyspnea on exertion, cough, sputum, wheezing.  GI: See history of present illness. GU:  Negative for dysuria, hematuria, urinary incontinence, urinary frequency, nocturnal urination.  MS: Negative for joint pain, low back pain.  Derm: Negative for rash or itching.  Neuro: Negative for weakness, abnormal sensation, seizure, frequent headaches, memory loss, confusion.  Psych: Negative for anxiety, depression, suicidal ideation, hallucinations.  Endo: Negative for unusual weight change.  Heme: Negative for bruising or bleeding. Allergy: Negative for rash or hives.  Physical Examination: Vital signs in last  24 hours: Temp:  [98.2 F (36.8 C)-98.3 F (36.8 C)] 98.3 F (36.8 C) (11/05 0548) Pulse Rate:  [90-94] 90 (11/05 0548) Resp:  [17-18] 18 (11/05 0548) BP: (100-107)/(45-48) 107/48 (11/05 0548) SpO2:  [98 %-100 %] 98 % (11/05 0548) Last BM Date: 01/26/19  General: Mechele Claude Caucasian female resting comfortably.  Easily arousable.  No acute distress.  Alert and oriented x4.  Head: Normocephalic, atraumatic.   Eyes: Conjunctiva pink, slight icterus. Mouth: Oropharyngeal mucosa moist and pink , no lesions erythema or exudate. Neck: Supple without thyromegaly, masses, or lymphadenopathy.  Lungs: Clear to auscultation bilaterally.  Heart: Regular rate and rhythm, no murmurs rubs or gallops.  Abdomen: Bowel sounds are normal, mild distention but soft, no abdominal bruits or    hernia , no rebound or guarding.   Rectal: Not performed Extremities: Trace bilateral lower extremity edema.  No clubbing, deformity.  Neuro: Alert and oriented x 4 , grossly normal neurologically.  Skin: Warm and dry, no rash or jaundice.   Psych: Alert and cooperative, normal mood and affect.        Intake/Output from previous day: 11/04 0701 - 11/05 0700 In: 1677.3 [I.V.:1677.3] Out: -  Intake/Output this shift: No intake/output data recorded.  Lab Results: CBC Recent Labs    01/26/19 0458 01/27/19 0421 01/28/19 0500  WBC 10.9* 13.7* 10.0  HGB 8.8* 8.3* 7.9*  HCT 26.6* 25.0* 24.2*  MCV 95.3 95.4 93.8  PLT 184 178 146*   BMET Recent Labs    01/26/19 0458 01/27/19 0421 01/28/19 0500  NA 130* 131* 129*  K 4.6 4.2 4.2  CL 100 100 102  CO2 21* 19* 19*  GLUCOSE 142* 129* 129*  BUN 44* 46* 49*  CREATININE 1.74* 1.70* 1.71*  CALCIUM 8.4* 8.6* 8.0*   LFT Recent Labs    01/26/19 0458 01/27/19 0421 01/28/19 0500  BILITOT 3.7* 3.8* 3.1*  ALKPHOS 128* 117 102  AST 63* 48* 39  ALT 48* 42 33  PROT 5.7* 5.5* 5.3*  ALBUMIN 2.3* 2.2* 2.3*    Lipase No results for input(s): LIPASE in  the last 72 hours.  PT/INR Recent Labs    01/25/19 2127  LABPROT 21.5*  INR 1.9*      Imaging Studies: US Paracentesis  Result Date: 01/27/2019 INDICATION: Ascites EXAM: ULTRASOUND GUIDED THERAPEUTIC PARACENTESIS MEDICATIONS: None COMPLICATIONS: None immediate PROCEDURE: Informed written consent was obtained from the patient after a discussion of the risks, benefits and alternatives to treatment. A timeout was performed prior to the initiation of the procedure. Initial ultrasound scanning demonstrates a large amount of ascites within the right lower abdominal quadrant. The right lower abdomen was prepped and draped in the usual sterile fashion. 1% lidocaine was used for local anesthesia. Following this, a 5 [place] Yueh catheter was introduced. An ultrasound image was saved for documentation purposes. The paracentesis was performed. The catheter was removed and a dressing was applied. The patient tolerated the procedure well without immediate post procedural complication. Patient received post-procedure intravenous albumin; see nursing notes for details. FINDINGS: A total of approximately 8.1 L of yellow fluid was removed. IMPRESSION: Successful ultrasound-guided paracentesis yielding 8.1 liters of peritoneal fluid. Electronically Signed   By: Lavonia Dana M.D.   On: 01/27/2019 16:58   US Paracentesis  Result Date: 01/13/2019 INDICATION: Cirrhosis Ascites EXAM: ULTRASOUND GUIDED RLQ PARACENTESIS MEDICATIONS: 10 cc 1% lidocaine COMPLICATIONS: None immediate. PROCEDURE: Informed written consent was obtained from the patient after a discussion  of the risks, benefits and alternatives to treatment. A timeout was performed prior to the initiation of the procedure. Initial ultrasound scanning demonstrates a large amount of ascites within the right lower abdominal quadrant. The right lower abdomen was prepped and draped in the usual sterile fashion. 1% lidocaine was used for local anesthesia. Following  this, a 7 cm Yeuh catheter was introduced. An ultrasound image was saved for documentation purposes. The paracentesis was performed. The catheter was removed and a dressing was applied. The patient tolerated the procedure well without immediate post procedural complication. Patient received post-procedure intravenous albumin; see nursing notes for details. FINDINGS: A total of approximately 6.2 Liters of reddish fluid was removed. Samples were sent to the laboratory as requested by the clinical team. IMPRESSION: Successful ultrasound-guided paracentesis yielding 6.2 liters of peritoneal fluid. Read by Lavonia Drafts [facility] Electronically Signed   By: Lavonia Dana M.D.   On: 01/13/2019 10:14   US Paracentesis  Result Date: 01/01/2019 INDICATION: Cirrhosis, ascites EXAM: ULTRASOUND GUIDED DIAGNOSTIC AND THERAPEUTIC PARACENTESIS MEDICATIONS: None COMPLICATIONS: None immediate PROCEDURE: Informed written consent was obtained from the patient after a discussion of the risks, benefits and alternatives to treatment. A timeout was performed prior to the initiation of the procedure. Initial ultrasound scanning demonstrates a large amount of ascites within the right lower abdominal quadrant. The right lower abdomen was prepped and draped in the usual sterile fashion. 1% lidocaine was used for local anesthesia. Following this, a 5 [place] Yueh catheter was introduced. An ultrasound image was saved for documentation purposes. The paracentesis was performed. The catheter was removed and a dressing was applied. The patient tolerated the procedure well without immediate post procedural complication. Patient received post-procedure intravenous albumin; see nursing notes for details. FINDINGS: A total of approximately 4.2 L of amber colored ascitic fluid was removed. Samples were sent to the laboratory as requested by the clinical team. IMPRESSION: Successful ultrasound-guided paracentesis yielding 4.2 liters of peritoneal fluid.  Electronically Signed   By: Lavonia Dana M.D.   On: 01/01/2019 13:31  [4 week]   Impression: Pleasant frail-appearing 73 year old female with cirrhosis suspected to be due to Jordan Le, endometrial cancer status post hysterectomy in February 2020, who developed rather quick decompensation of her liver disease over the past several months.  Over the past couple of months she has become weaker, decreased oral intake, and has had increased frequency of abdominal paracenteses.  Current MELD Na of 30 (27 to 32% 90-day mortality).  Patient is not a liver transplant candidate due to age and recent cancer history.  Patient and family have met with palliative medicine team.  Patient and daughters are requesting permanent Pleurx drain.  They endorse difficulty getting to and from paracenteses on a frequent basis that the patient is requiring.  They are aware of risk of infection, sepsis, hepatorenal syndrome.  Family also inquiring about surveillance with hospice of [facility].  Elevated ammonia level, improved. ?mild HE. Continue lactulose at dose to promote 2-3 soft stools daily. Will continue to work on [employer] as outpatient.    Plan: 1. To discuss Pleurx drain with Dr. Gala Romney. Pleurx drain has been discussed previously with patient by [facility] care and her primary GI, Dr. Oneida Alar. This may be a reasonable approach especially if patient chooses Hospice services.  2. Decrease lactulose to BID given 4-7 stools per 24 hours the last two days.   We would like to thank you for the opportunity to participate in the care of [facility]  B Se[facility].  Laureen Ochs. Bernarda Caffey [facility] [phone] 11/5/202010:04 AM     LOS: 2 days

## 2019-01-29 ENCOUNTER — Ambulatory Visit (HOSPITAL_COMMUNITY)
Admission: RE | Admit: 2019-01-29 | Discharge: 2019-01-29 | Disposition: A | Discharge status: Home / Self Care | Payer: PPO | Source: Ambulatory Visit | Attending: Family Medicine | Admitting: Family Medicine

## 2019-01-29 ENCOUNTER — Encounter (HOSPITAL_COMMUNITY): Payer: Self-pay | Admitting: Interventional Radiology

## 2019-01-29 ENCOUNTER — Other Ambulatory Visit (HOSPITAL_COMMUNITY): Payer: PPO

## 2019-01-29 DIAGNOSIS — R188 Other ascites: Secondary | ICD-10-CM

## 2019-01-29 DIAGNOSIS — N179 Acute kidney failure, unspecified: Principal | ICD-10-CM

## 2019-01-29 DIAGNOSIS — D649 Anemia, unspecified: Secondary | ICD-10-CM

## 2019-01-29 DIAGNOSIS — K7200 Acute and subacute hepatic failure without coma: Secondary | ICD-10-CM

## 2019-01-29 DIAGNOSIS — E871 Hypo-osmolality and hyponatremia: Secondary | ICD-10-CM

## 2019-01-29 DIAGNOSIS — K7210 Chronic hepatic failure without coma: Secondary | ICD-10-CM

## 2019-01-29 HISTORY — PX: IR PERC TUN PERIT CATH WO PORT S&I /IMAG: IMG2327

## 2019-01-29 LAB — CBC WITH DIFFERENTIAL/PLATELET
Abs Immature Granulocytes: 0.09 10*3/uL — ABNORMAL HIGH (ref 0.00–0.07)
Basophils Absolute: 0.1 10*3/uL (ref 0.0–0.1)
Basophils Relative: 1 %
Eosinophils Absolute: 0.9 10*3/uL — ABNORMAL HIGH (ref 0.0–0.5)
Eosinophils Relative: 7 %
HCT: 26.1 % — ABNORMAL LOW (ref 36.0–46.0)
Hemoglobin: 8.7 g/dL — ABNORMAL LOW (ref 12.0–15.0)
Immature Granulocytes: 1 %
Lymphocytes Relative: 9 %
Lymphs Abs: 1.2 10*3/uL (ref 0.7–4.0)
MCH: 31.2 pg (ref 26.0–34.0)
MCHC: 33.3 g/dL (ref 30.0–36.0)
MCV: 93.5 fL (ref 80.0–100.0)
Monocytes Absolute: 2.4 10*3/uL — ABNORMAL HIGH (ref 0.1–1.0)
Monocytes Relative: 20 %
Neutro Abs: 7.6 10*3/uL (ref 1.7–7.7)
Neutrophils Relative %: 62 %
Platelets: 172 10*3/uL (ref 150–400)
RBC: 2.79 MIL/uL — ABNORMAL LOW (ref 3.87–5.11)
RDW: 15.6 % — ABNORMAL HIGH (ref 11.5–15.5)
WBC: 12.2 10*3/uL — ABNORMAL HIGH (ref 4.0–10.5)
nRBC: 0 % (ref 0.0–0.2)

## 2019-01-29 MED ORDER — LIDOCAINE HCL 1 % IJ SOLN
INTRAMUSCULAR | Status: AC | PRN
  Administered 2019-01-29: 5 mL

## 2019-01-29 MED ORDER — CEFAZOLIN SODIUM-DEXTROSE 2-4 GM/100ML-% IV SOLN
2.0000 g | Freq: Once | INTRAVENOUS | Status: AC
  Administered 2019-01-29: 15:00:00 2 g via INTRAVENOUS

## 2019-01-29 MED ORDER — FENTANYL CITRATE (PF) 100 MCG/2ML IJ SOLN
INTRAMUSCULAR | Status: AC | PRN
  Administered 2019-01-29: 25 ug via INTRAVENOUS

## 2019-01-29 MED ORDER — MIDAZOLAM HCL 2 MG/2ML IJ SOLN
INTRAMUSCULAR | Status: AC
  Filled 2019-01-29: qty 2

## 2019-01-29 MED ORDER — FENTANYL CITRATE (PF) 100 MCG/2ML IJ SOLN
INTRAMUSCULAR | Status: AC
  Filled 2019-01-29: qty 2

## 2019-01-29 MED ORDER — MIDAZOLAM HCL 2 MG/2ML IJ SOLN
INTRAMUSCULAR | Status: AC | PRN
  Administered 2019-01-29: 0.5 mg via INTRAVENOUS

## 2019-01-29 MED ORDER — CEFAZOLIN SODIUM-DEXTROSE 2-4 GM/100ML-% IV SOLN
INTRAVENOUS | Status: AC
  Administered 2019-01-29: 2 g via INTRAVENOUS
  Filled 2019-01-29: qty 100

## 2019-01-29 NOTE — Sedation Documentation (Signed)
Pt arrived from [facility] via carelink. Received report from carelink. Pt denies pain. Consent signed, pt is sleepy but awake when spoken to, oriented to person time and place. Dr Kathlene Cote has spoken to pt about procedure today and she verbalizes understanding.

## 2019-01-29 NOTE — Care Management Important Message (Signed)
Important Message  Patient Details  Name: Jordan Le MRN: [id number] Date of Birth: March 26, 1945   Medicare Important Message Given:  Yes     [name] 01/29/2019, 3:20 PM

## 2019-01-29 NOTE — Progress Notes (Signed)
Patient Demographics:    Jordan Le, is a 73 y.o. female, DOB - Feb 06, 1946, YTW:[id number]  Admit date - 01/25/2019   Admitting Physician Reubin Milan, MD  Outpatient Primary MD for the patient is [name], [name], MD  LOS - 3   Chief Complaint  Patient presents with  . Weakness        Subjective:    Jordan Le today has no fevers, no emesis,  -resting comfortably   Assessment  & Plan :    Principal Problem:   AKI (acute kidney injury) ([place]) Active Problems:   Essential hypertension   Acute hepatic encephalopathy   Hepatic encephalopathy (HCC)   Type 2 diabetes mellitus (HCC)   Hyponatremia   Anemia   Hypothyroidism   Chronic liver failure without hepatic coma (HCC)   Goals of care, counseling/discussion   Palliative care by specialist   DNR (do not resuscitate) discussion   Encounter for hospice care discussion  Brief History: 73 year old female with  DM2,  endometrial cancer (status post hysterectomy February 202)  HTN/HLD, history of TIA, CKD III, NASH liver cirrhosis  admitted on 01/25/2019 with  hyponatremia, hepatic encephalopathy and AKI on CKD III   Assessment/Plan:  1)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--- --Hepatitis B surface antigen--neg -Hepatitis C antibody--neg -Antimitochondrial antibody negative -Anti-smooth muscle antibody negative -Alpha-1 antitrypsin level--normal -Alpha-fetoprotein pending -ANA negative --Scheduled for palliative/therapeutic paracentesis with albumin transfusion on 01/27/2019 -Not compliant with lactulose at home, -Ammonia on admission was 131, down to 56 now baseline usually in the 40s -c/n Lactulose to 30 g twice daily -MELD score this admission- 31 points , 53 % 3 month Mortality -Had paracentesis on 01/27/2019 with removal of 8.1 L of fluid -on 01/29/2019--Tunneled peritoneal PleurX catheter placed from  right abdominal approach. 2.5 L ascites removed. -Interventional radiology consult for drainage catheter/Pleurx tunneled catheter-placement due to recurrent ascites requiring frequent paracentesis -d/w Dr Markus Daft  2)AKI----acute kidney injury on CKD stage - III-suspect due to decreased oral intake in the setting of increased lethargy compounded by some component of hepatorenal syndrome -   creatinine on admission=  1.94,   baseline creatinine =1.2 to 1.3    , creatinine is now= 1.71 , renally adjust medications, avoid nephrotoxic agents / dehydration /hypotension -Continue IV fluids especially in the setting of lactulose use with GI losses  3)H/o SVT/Atrial tachycardia -Echo from 11/12/2018 with EF over 65%,- -TSH 5.4 -Consider propranolol or atenolol if tachycardia persist  4)H/o DM2  -10/26/18 Hemoglobin A1c--4.7--putting her at risk for hypoglycemia --Discontinued metformin - 5)Hypothyroidism -TSH is 5.4 -Continue Synthroid  6)H/o Endometrial Carcinoma -Status post hysterectomy 05/07/2018 -follow up Dr. Denman George  7)Social/Ethics- family conference with Palliative care and pt's 2 daughters----  Discussed with daughters x 2 (Ms Shirley Friar- [phone]) and Nancie Neas--- [phone]) Pt is a DNR,  Palliative care consult appreciated -Patient plans to enroll in hospice services in the near future, she needs Pleurx peritoneal catheter placed to avoid repeated trips to the hospital for paracentesis  8)Anemia of chronic disease --- in the setting of liver cirrhosis --Hgb currently 7.9, continue to monitor and transfuse as clinically indicated  Family Communication:Discussed with daughters x 2 (Ms Shirley Friar- [phone]) and Nancie Neas--- [phone])  Consultants:Palliative care/IR/GI  Code Status: DNR  DVT Prophylaxis: SCD  Disposition/Need for in-Hospital Stay- patient unable to be discharged at this time due to --- requiring IV fluids  due to GI losses induced by lactulose  Disposition Plan  : TBD  Lab Results  Component Value Date   PLT 172 01/29/2019   Procedure- -Had paracentesis on 01/27/2019 with removal of 8.1 L of fluid - on 01/29/2019--Tunneled peritoneal PleurX catheter placed from right abdominal approach. 2.5 L ascites removed.  Inpatient Medications  Scheduled Meds: . diltiazem  120 mg Oral Daily  . feeding supplement (ENSURE ENLIVE)  237 mL Oral Q1400  . lactulose  30 g Oral BID  . levothyroxine  75 mcg Oral QAC breakfast  . pantoprazole  40 mg Oral Daily  . zinc sulfate  220 mg Oral q morning - 10a   Continuous Infusions: . dextrose 5 % and 0.45% NaCl 50 mL/hr at 01/28/19 1529   PRN Meds:.prochlorperazine   Anti-infectives (From admission, onward)   None        Objective:   Vitals:   01/28/19 2159 01/29/19 0547 01/29/19 0749 01/29/19 0803  BP: (!) 106/46 (!) 106/56  (!) 110/49  Pulse: (!) 101 92  89  Resp: 20 17  16   Temp: 98.1 F (36.7 C) 98.4 F (36.9 C)  (!) 97.3 F (36.3 C)  TempSrc: Oral   Oral  SpO2: 95% 96% 98% 96%  Weight:      Height:        Wt Readings from Last 3 Encounters:  01/26/19 83.5 kg  01/04/19 79.8 kg  12/30/18 83.4 kg     Intake/Output Summary (Last 24 hours) at 01/29/2019 1551 Last data filed at 01/29/2019 1527 Gross per 24 hour  Intake 1207.99 ml  Output -  Net 1207.99 ml   Physical Exam Gen:- Awake Alert, resting comfortably, chronically ill-appearing HEENT:- [place].AT,   Neck-Supple Neck,No JVD,.  Lungs-diminished in bases, no wheezing  CV- S1, S2 normal, regular  Abd-  +ve B.Sounds, much less distended after paracentesis, Rt sided PleurX catheter Extremity/Skin:- 2 +  edema, pedal pulses present  Psych-more awake, coherent neuro-generalized weakness, no new focal deficits,      Data Review:   Micro Results Recent Results (from the past 240 hour(s))  Urine Culture     Status: None   Collection Time: 01/25/19  9:12 PM   Specimen: Urine,  Catheterized  Result Value Ref Range Status   Specimen Description   Final    URINE, CATHETERIZED Performed at [facility], [address] [place], [place] [address]    Special Requests   Final    NONE Performed at [facility], [address], [place], [place] [address]    Culture   Final    NO GROWTH Performed at [facility], [place] [address], [place], [place] [address]    Report Status 01/27/2019 FINAL  Final  SARS CORONAVIRUS 2 (TAT 6-24 HRS) Nasopharyngeal Nasopharyngeal Swab     Status: None   Collection Time: 01/25/19 11:39 PM   Specimen: Nasopharyngeal Swab  Result Value Ref Range Status   SARS Coronavirus 2 NEGATIVE NEGATIVE Final    Comment: (NOTE) SARS-CoV-2 target nucleic acids are NOT DETECTED. The SARS-CoV-2 RNA is generally detectable in upper and lower respiratory specimens during the acute phase of infection. Negative results do not preclude SARS-CoV-2 infection, do not rule out co-infections with other pathogens, and should not be used as the sole basis for treatment or other patient management decisions.  Negative results must be combined with clinical observations, patient history, and epidemiological information. The expected result is Negative. Fact Sheet for Patients: SugarRoll.be Fact Sheet for Healthcare Providers: https://www.woods-mathews.com/ This test is not yet approved or cleared by the [place] FDA and  has been authorized for detection and/or diagnosis of SARS-CoV-2 by FDA under an Emergency Use Authorization (EUA). This EUA will remain  in effect (meaning this test can be used) for the duration of the COVID-19 declaration under Section 56 4(b)(1) of the Act, 21 U.S.C. section 360bbb-3(b)(1), unless the authorization is terminated or revoked sooner. Performed at [facility], [place] [address], [place], [place] [address]    Radiology Reports US Paracentesis  Result Date: 01/27/2019  INDICATION: Ascites EXAM: ULTRASOUND GUIDED THERAPEUTIC PARACENTESIS MEDICATIONS: None COMPLICATIONS: None immediate PROCEDURE: Informed written consent was obtained from the patient after a discussion of the risks, benefits and alternatives to treatment. A timeout was performed prior to the initiation of the procedure. Initial ultrasound scanning demonstrates a large amount of ascites within the right lower abdominal quadrant. The right lower abdomen was prepped and draped in the usual sterile fashion. 1% lidocaine was used for local anesthesia. Following this, a 5 [place] Yueh catheter was introduced. An ultrasound image was saved for documentation purposes. The paracentesis was performed. The catheter was removed and a dressing was applied. The patient tolerated the procedure well without immediate post procedural complication. Patient received post-procedure intravenous albumin; see nursing notes for details. FINDINGS: A total of approximately 8.1 L of yellow fluid was removed. IMPRESSION: Successful ultrasound-guided paracentesis yielding 8.1 liters of peritoneal fluid. Electronically Signed   By: Lavonia Dana M.D.   On: 01/27/2019 16:58   US Paracentesis  Result Date: 01/13/2019 INDICATION: Cirrhosis Ascites EXAM: ULTRASOUND GUIDED RLQ PARACENTESIS MEDICATIONS: 10 cc 1% lidocaine COMPLICATIONS: None immediate. PROCEDURE: Informed written consent was obtained from the patient after a discussion of the risks, benefits and alternatives to treatment. A timeout was performed prior to the initiation of the procedure. Initial ultrasound scanning demonstrates a large amount of ascites within the right lower abdominal quadrant. The right lower abdomen was prepped and draped in the usual sterile fashion. 1% lidocaine was used for local anesthesia. Following this, a 7 cm Yeuh catheter was introduced. An ultrasound image was saved for documentation purposes. The paracentesis was performed. The catheter was removed and  a dressing was applied. The patient tolerated the procedure well without immediate post procedural complication. Patient received post-procedure intravenous albumin; see nursing notes for details. FINDINGS: A total of approximately 6.2 Liters of reddish fluid was removed. Samples were sent to the laboratory as requested by the clinical team. IMPRESSION: Successful ultrasound-guided paracentesis yielding 6.2 liters of peritoneal fluid. Read by Lavonia Drafts [facility] Electronically Signed   By: Lavonia Dana M.D.   On: 01/13/2019 10:14   US Paracentesis  Result Date: 01/01/2019 INDICATION: Cirrhosis, ascites EXAM: ULTRASOUND GUIDED DIAGNOSTIC AND THERAPEUTIC PARACENTESIS MEDICATIONS: None COMPLICATIONS: None immediate PROCEDURE: Informed written consent was obtained from the patient after a discussion of the risks, benefits and alternatives to treatment. A timeout was performed prior to the initiation of the procedure. Initial ultrasound scanning demonstrates a large amount of ascites within the right lower abdominal quadrant. The right lower abdomen was prepped and draped in the usual sterile fashion. 1% lidocaine was used for local anesthesia. Following this, a 5 [place] Yueh catheter was introduced. An ultrasound image was saved for documentation purposes. The paracentesis was performed. The catheter was removed  and a dressing was applied. The patient tolerated the procedure well without immediate post procedural complication. Patient received post-procedure intravenous albumin; see nursing notes for details. FINDINGS: A total of approximately 4.2 L of amber colored ascitic fluid was removed. Samples were sent to the laboratory as requested by the clinical team. IMPRESSION: Successful ultrasound-guided paracentesis yielding 4.2 liters of peritoneal fluid. Electronically Signed   By: Lavonia Dana M.D.   On: 01/01/2019 13:31     CBC Recent Labs  Lab 01/25/19 2127 01/26/19 0458 01/27/19 0421 01/28/19 0500  01/29/19 0516  WBC 11.9* 10.9* 13.7* 10.0 12.2*  HGB 9.7* 8.8* 8.3* 7.9* 8.7*  HCT 28.9* 26.6* 25.0* 24.2* 26.1*  PLT 219 184 178 146* 172  MCV 94.1 95.3 95.4 93.8 93.5  MCH 31.6 31.5 31.7 30.6 31.2  MCHC 33.6 33.1 33.2 32.6 33.3  RDW 16.2* 16.4* 16.3* 15.9* 15.6*  LYMPHSABS 0.8 1.2 1.2 0.9 1.2  MONOABS 1.2* 1.6* 2.9* 1.9* 2.4*  EOSABS 0.1 0.2 0.5 0.7* 0.9*  BASOSABS 0.1 0.1 0.1 0.1 0.1    Chemistries  Recent Labs  Lab 01/25/19 2127 01/26/19 0458 01/27/19 0421 01/28/19 0500  NA 125* 130* 131* 129*  K 5.1 4.6 4.2 4.2  CL 92* 100 100 102  CO2 20* 21* 19* 19*  GLUCOSE 182* 142* 129* 129*  BUN 43* 44* 46* 49*  CREATININE 1.94* 1.74* 1.70* 1.71*  CALCIUM 8.5* 8.4* 8.6* 8.0*  AST 76* 63* 48* 39  ALT 55* 48* 42 33  ALKPHOS 135* 128* 117 102  BILITOT 3.4* 3.7* 3.8* 3.1*   ------------------------------------------------------------------------------------------------------------------ No results for input(s): CHOL, HDL, LDLCALC, TRIG, CHOLHDL, LDLDIRECT in the last 72 hours.  Lab Results  Component Value Date   HGBA1C 4.7 (L) 10/26/2018   ------------------------------------------------------------------------------------------------------------------ No results for input(s): TSH, T4TOTAL, T3FREE, THYROIDAB in the last 72 hours.  Invalid input(s): FREET3 ------------------------------------------------------------------------------------------------------------------ No results for input(s): VITAMINB12, FOLATE, FERRITIN, TIBC, IRON, RETICCTPCT in the last 72 hours.  Coagulation profile Recent Labs  Lab 01/25/19 2127  INR 1.9*   No results for input(s): DDIMER in the last 72 hours.  Cardiac Enzymes No results for input(s): CKMB, TROPONINI, MYOGLOBIN in the last 168 hours.  Invalid input(s): CK ------------------------------------------------------------------------------------------------------------------    Component Value Date/Time   BNP 48.0 01/25/2019 2127    Roxan Hockey M.D on 01/29/2019 at 3:51 PM  Go to [email or web address] - for contact info  [facility] - Office  [phone]

## 2019-01-29 NOTE — TOC Progression Note (Signed)
Transition of Care [facility]) - Progression Note    Patient Details  Name: Jordan Le MRN: [id number] Date of Birth: 1945/05/01  Transition of Care [facility]) CM/SW Contact  Ihor Gully, LCSW Phone Number: 01/29/2019, 1:09 PM  Clinical Narrative:    Cassandra with [facility] has spoken with patient's daughter and initiated services for hospice at home. Hospice will equip patient with rollator, BSC, lift chair and over bed table. She requested Xifaxin be discontinued as patient is not taking and it is considered a life sustaining medication. Attending notified. Hospice also requested hospital provide patient with a few drain bag/bottles for her plurex drain that is to be placed. RN agreeable.      Expected Discharge Plan: Home w Hospice Care Barriers to Discharge: Continued Medical Work up  Expected Discharge Plan and Services Expected Discharge Plan: [place] Acute Care Choice: Hospice Living arrangements for the past 2 months: Single Family Home                                       Social Determinants of Health (SDOH) Interventions    Readmission Risk Interventions Readmission Risk Prevention Plan 12/08/2018 12/04/2018  Transportation Screening - Complete  HRI or [place] - Complete  Social Work Consult for [place] Planning/Counseling - Complete  Palliative Care Screening - Not Complete  Medication Review [employer]) - Complete  PCP or Specialist appointment within 3-5 days of discharge Complete -  [place] or Home Care Consult Complete -  [facility] Recovery Care/Counseling Consult Complete -  Palliative Care Screening Not Applicable -  [place] Not Applicable -  Some recent data might be hidden

## 2019-01-29 NOTE — Consult Note (Signed)
Chief Complaint: Patient was seen in consultation today for recurrent ascites, end stage liver disease  Referring Physician(s): [place]  Supervising Physician: Aletta Edouard  Patient Status: [facility] - Out-pt  History of Present Illness: Jordan Le is a 73 y.o. female with history of arthritis, DM, HTN, endometrial cancer, and advanced liver disease, cirrhosis with recurrent ascites who is known to IR from routine, large volume paracentesis.  Her last paracentesis was performed 11/4 yielding 8.1 liters of fluid.  IR now consulted for peritoneal PleurX catheter placement for likely transition to hospice.  Patient presents to IR today lethargic but arousable.  She is able to tell me that she is at [facility] today for a "tube."    Case reviewed and approved by Dr. Anselm Pancoast. She has been NPO.  No blood thinners in the setting of her advanced liver disease with elevated INR.  She received dose of Vitamin K overnight.  Past Medical History:  Diagnosis Date   Arthritis    Cirrhosis of liver ([place])    Diabetes mellitus without complication ([place])    type 2   Endometrial cancer ([place]) 05/07/2018   High cholesterol    Hypertension    Mini stroke ([place])    2011   Pneumonia    Renal disorder     Past Surgical History:  Procedure Laterality Date   ABDOMINAL HYSTERECTOMY     BIOPSY  11/23/2018   Procedure: BIOPSY;  Surgeon: Danie Binder, MD;  Location: [facility] SUITE;  Service: Endoscopy;;   BREAST BIOPSY Right    ~15 years ago-2005   ESOPHAGOGASTRODUODENOSCOPY N/A 11/23/2018   Dr. Oneida Alar: MILD Portal hypertensive gastropathy.  Moderate gastritis/mild duodenitis due to aspirin.  Next surveillance EGD in 2 to 3 years.   OTHER SURGICAL HISTORY  2009   Uterine Polyp removal   polyps removed from the uterus     ROBOTIC ASSISTED TOTAL HYSTERECTOMY WITH BILATERAL SALPINGO OOPHERECTOMY Bilateral 05/07/2018   Procedure: XI ROBOTIC ASSISTED TOTAL HYSTERECTOMY WITH  BILATERAL SALPINGO OOPHORECTOMY PELVIC LYMPHANECTOMY;  Surgeon: Everitt Amber, MD;  Location: [name];  Service: Gynecology;  Laterality: Bilateral;   SENTINEL NODE BIOPSY Bilateral 05/07/2018   Procedure: SENTINEL NODE BIOPSY;  Surgeon: Everitt Amber, MD;  Location: [name];  Service: Gynecology;  Laterality: Bilateral;    Allergies: Patient has no known allergies.  Medications: Prior to Admission medications   Medication Sig Start Date End Date Taking? Authorizing Provider  atorvastatin (LIPITOR) 10 MG tablet Take 1 tablet (10 mg total) by mouth daily at 6 PM. 11/05/18   Emokpae, Courage, MD  diltiazem (CARDIZEM CD) 120 MG 24 hr capsule Take 1 capsule (120 mg total) by mouth daily. 11/05/18 11/05/19  Roxan Hockey, MD  docusate sodium (COLACE) 100 MG capsule Take 1 capsule (100 mg total) by mouth daily. 12/08/18   Manuella Ghazi, Pratik D, DO  feeding supplement, ENSURE ENLIVE, (ENSURE ENLIVE) LIQD Take 237 mLs by mouth 2 (two) times daily between meals. Patient taking differently: Take 237 mLs by mouth daily at 2 PM.  11/05/18   Emokpae, Courage, MD  lactulose (CHRONULAC) 10 GM/15ML solution Take 30 mLs (20 g total) by mouth 3 (three) times daily. Patient taking differently: Take 20 g by mouth 2 (two) times daily as needed for moderate constipation.  12/08/18   Manuella Ghazi, Pratik D, DO  levOCARNitine, Dietary, 330 MG TABS Take 330 mg by mouth 3 (three) times daily. Patient not taking: Reported on 01/25/2019 01/07/19   Carlis Stable, NP  levothyroxine (  SYNTHROID) 75 MCG tablet Take 75 mcg by mouth daily before breakfast.  10/05/18 10/30/19  [provider]  Multiple Vitamin (MULTIVITAMIN WITH MINERALS) TABS tablet Take 1 tablet by mouth daily. 11/06/18   Roxan Hockey, MD  nystatin (MYCOSTATIN/NYSTOP) powder Apply topically 3 (three) times daily. Patient not taking: Reported on 01/25/2019 11/05/18   Roxan Hockey, MD  omeprazole (PRILOSEC) 20 MG capsule 1 PO 30 MINS PRIOR TO BREAKFAST. Patient taking  differently: Take 20 mg by mouth daily before breakfast. 1 PO 30 MINS PRIOR TO BREAKFAST. 11/23/18   Fields, Marga Melnick, MD  Potassium 99 MG TABS Take 1 tablet by mouth daily.    [provider]  rifaximin (XIFAXAN) 550 MG TABS tablet Take 1 tablet (550 mg total) by mouth 2 (two) times daily. Patient not taking: Reported on 01/25/2019 01/07/19 02/06/19  Danie Binder, MD  sennosides-docusate sodium (SENOKOT-S) 8.6-50 MG tablet 1 PO BID Patient taking differently: Take 1 tablet by mouth 2 (two) times daily.  01/07/19   Fields, Marga Melnick, MD  spironolactone (ALDACTONE) 50 MG tablet Take 1 tablet (50 mg total) by mouth daily. 12/08/18 01/25/19  Manuella Ghazi, Pratik D, DO  torsemide (DEMADEX) 20 MG tablet Take 1 tablet (20 mg total) by mouth daily. 12/08/18 01/25/19  Manuella Ghazi, Pratik D, DO  zinc sulfate 220 (50 Zn) MG capsule Take 1 capsule (220 mg total) by mouth 3 (three) times daily. Patient taking differently: Take 220 mg by mouth every morning.  01/07/19   Carlis Stable, NP     Family History  Problem Relation Age of Onset   Lung cancer Mother    Hypertension Father    Pancreatic cancer Brother    Liver disease Neg Hx     Social History   Socioeconomic History   Marital status: Married    Spouse name: Not on file   Number of children: Not on file   Years of education: Not on file   Highest education level: Not on file  Occupational History   Not on file  Social Needs   Financial resource strain: Not on file   Food insecurity    Worry: Not on file    Inability: Not on file   Transportation needs    Medical: Not on file    Non-medical: Not on file  Tobacco Use   Smoking status: Never Smoker   Smokeless tobacco: Never Used  Substance and Sexual Activity   Alcohol use: Never    Frequency: Never   Drug use: Never   Sexual activity: Not Currently  Lifestyle   Physical activity    Days per week: Not on file    Minutes per session: Not on file   Stress: Not on file    Relationships   Social connections    Talks on phone: Not on file    Gets together: Not on file    Attends religious service: Not on file    Active member of club or organization: Not on file    Attends meetings of clubs or organizations: Not on file    Relationship status: Not on file  Other Topics Concern   Not on file  Social History Narrative   Not on file     Review of Systems: A 12 point ROS discussed and pertinent positives are indicated in the HPI above.  All other systems are negative.  Review of Systems  Unable to perform ROS: Acuity of condition    Vital Signs: BP (!) 116/52 (  BP Location: Right Arm)    Pulse 90    Resp 20    SpO2 97%   Physical Exam Vitals signs and nursing note reviewed.  Constitutional:      General: She is not in acute distress.    Appearance: She is ill-appearing.  HENT:     Mouth/Throat:     Mouth: Mucous membranes are moist.     Pharynx: Oropharynx is clear.  Cardiovascular:     Rate and Rhythm: Normal rate and regular rhythm.  Pulmonary:     Effort: Pulmonary effort is normal. No respiratory distress.     Breath sounds: Normal breath sounds.  Abdominal:     General: There is distension.     Palpations: Abdomen is soft.     Tenderness: There is no abdominal tenderness.  Skin:    General: Skin is warm and dry.  Neurological:     General: No focal deficit present.     Mental Status: She is alert. Mental status is at baseline.      MD Evaluation Airway: WNL Heart: WNL Abdomen: WNL Chest/ Lungs: WNL ASA  Classification: 3 Mallampati/Airway Score: Two   Imaging: US Paracentesis  Result Date: 01/27/2019 INDICATION: Ascites EXAM: ULTRASOUND GUIDED THERAPEUTIC PARACENTESIS MEDICATIONS: None COMPLICATIONS: None immediate PROCEDURE: Informed written consent was obtained from the patient after a discussion of the risks, benefits and alternatives to treatment. A timeout was performed prior to the initiation of the procedure.  Initial ultrasound scanning demonstrates a large amount of ascites within the right lower abdominal quadrant. The right lower abdomen was prepped and draped in the usual sterile fashion. 1% lidocaine was used for local anesthesia. Following this, a 5 [place] Yueh catheter was introduced. An ultrasound image was saved for documentation purposes. The paracentesis was performed. The catheter was removed and a dressing was applied. The patient tolerated the procedure well without immediate post procedural complication. Patient received post-procedure intravenous albumin; see nursing notes for details. FINDINGS: A total of approximately 8.1 L of yellow fluid was removed. IMPRESSION: Successful ultrasound-guided paracentesis yielding 8.1 liters of peritoneal fluid. Electronically Signed   By: Lavonia Dana M.D.   On: 01/27/2019 16:58   US Paracentesis  Result Date: 01/13/2019 INDICATION: Cirrhosis Ascites EXAM: ULTRASOUND GUIDED RLQ PARACENTESIS MEDICATIONS: 10 cc 1% lidocaine COMPLICATIONS: None immediate. PROCEDURE: Informed written consent was obtained from the patient after a discussion of the risks, benefits and alternatives to treatment. A timeout was performed prior to the initiation of the procedure. Initial ultrasound scanning demonstrates a large amount of ascites within the right lower abdominal quadrant. The right lower abdomen was prepped and draped in the usual sterile fashion. 1% lidocaine was used for local anesthesia. Following this, a 7 cm Yeuh catheter was introduced. An ultrasound image was saved for documentation purposes. The paracentesis was performed. The catheter was removed and a dressing was applied. The patient tolerated the procedure well without immediate post procedural complication. Patient received post-procedure intravenous albumin; see nursing notes for details. FINDINGS: A total of approximately 6.2 Liters of reddish fluid was removed. Samples were sent to the laboratory as requested  by the clinical team. IMPRESSION: Successful ultrasound-guided paracentesis yielding 6.2 liters of peritoneal fluid. Read by Lavonia Drafts [facility] Electronically Signed   By: Lavonia Dana M.D.   On: 01/13/2019 10:14   US Paracentesis  Result Date: 01/01/2019 INDICATION: Cirrhosis, ascites EXAM: ULTRASOUND GUIDED DIAGNOSTIC AND THERAPEUTIC PARACENTESIS MEDICATIONS: None COMPLICATIONS: None immediate PROCEDURE: Informed written consent was obtained from the  patient after a discussion of the risks, benefits and alternatives to treatment. A timeout was performed prior to the initiation of the procedure. Initial ultrasound scanning demonstrates a large amount of ascites within the right lower abdominal quadrant. The right lower abdomen was prepped and draped in the usual sterile fashion. 1% lidocaine was used for local anesthesia. Following this, a 5 [place] Yueh catheter was introduced. An ultrasound image was saved for documentation purposes. The paracentesis was performed. The catheter was removed and a dressing was applied. The patient tolerated the procedure well without immediate post procedural complication. Patient received post-procedure intravenous albumin; see nursing notes for details. FINDINGS: A total of approximately 4.2 L of amber colored ascitic fluid was removed. Samples were sent to the laboratory as requested by the clinical team. IMPRESSION: Successful ultrasound-guided paracentesis yielding 4.2 liters of peritoneal fluid. Electronically Signed   By: Lavonia Dana M.D.   On: 01/01/2019 13:31    Labs:  CBC: Recent Labs    01/26/19 0458 01/27/19 0421 01/28/19 0500 01/29/19 0516  WBC 10.9* 13.7* 10.0 12.2*  HGB 8.8* 8.3* 7.9* 8.7*  HCT 26.6* 25.0* 24.2* 26.1*  PLT 184 178 146* 172    COAGS: Recent Labs    05/05/18 0858  12/07/18 0459 12/08/18 0457 01/04/19 1200 01/25/19 2127  INR 1.34   < > 2.2* 2.2* 1.8* 1.9*  APTT 32  --   --   --  33  --    < > = values in this interval not  displayed.    BMP: Recent Labs    01/25/19 2127 01/26/19 0458 01/27/19 0421 01/28/19 0500  NA 125* 130* 131* 129*  K 5.1 4.6 4.2 4.2  CL 92* 100 100 102  CO2 20* 21* 19* 19*  GLUCOSE 182* 142* 129* 129*  BUN 43* 44* 46* 49*  CALCIUM 8.5* 8.4* 8.6* 8.0*  CREATININE 1.94* 1.74* 1.70* 1.71*  GFRNONAA 25* 28* 29* 29*  GFRAA 29* 33* 34* 34*    LIVER FUNCTION TESTS: Recent Labs    01/25/19 2127 01/26/19 0458 01/27/19 0421 01/28/19 0500  BILITOT 3.4* 3.7* 3.8* 3.1*  AST 76* 63* 48* 39  ALT 55* 48* 42 33  ALKPHOS 135* 128* 117 102  PROT 6.0* 5.7* 5.5* 5.3*  ALBUMIN 2.4* 2.3* 2.2* 2.3*    TUMOR MARKERS: Recent Labs    11/20/18 0840  AFPTM 14.4*    Assessment and Plan: Recurrent ascites Patient with past medical history of end stage liver disease presents with complaint of recurrent ascites. IR consulted for abdominal PleurX placement at the request of Dr. Denton Brick. Vitamin K dosed overnight for INR 1.9 NPO. Recently underwent paracentesis 11/4, but does reaccumulate rapidly. Hopefully she has enough fluid for placement of catheter today.  Patient will need orders for supplies and care (if needed) from ordering provider.   Risks and benefits discussed with the patient including bleeding, infection, damage to adjacent structures, and sepsis.  All of the patient's questions were answered, patient is agreeable to proceed. Consent signed and in chart.  Thank you for this interesting consult.  I greatly enjoyed meeting AYLISSA HEINEMANN and look forward to participating in their care.  A copy of this report was sent to the requesting provider on this date.  Electronically Signed: Docia Barrier, PA 01/29/2019, 2:19 PM   I spent a total of 20 Minutes    in face to face in clinical consultation, greater than 50% of which was counseling/coordinating care for recurrent ascites.

## 2019-01-29 NOTE — Procedures (Signed)
Interventional Radiology Procedure Note  Procedure: Tunneled peritoneal catheter placement  Complications: None  Estimated Blood Loss: < 10 mL  Findings: Tunneled peritoneal PleurX catheter placed from right abdominal approach. 2.5 L ascites removed.  Venetia Night. Kathlene Cote, M.D Pager:  [phone]

## 2019-01-29 NOTE — Progress Notes (Signed)
Subjective: Feels better than when she was admitted. Has been having weakness, fatigue. Wants to take a nap. Is anxious about procedure today. Her daughters will be back this evening, after her procedure, to be with her. Considering possible hospice care. No overt complaints.  Objective: Vital signs in last 24 hours: Temp:  [97.3 F (36.3 C)-98.4 F (36.9 C)] 97.3 F (36.3 C) (11/06 0803) Pulse Rate:  [89-101] 89 (11/06 0803) Resp:  [16-20] 16 (11/06 0803) BP: (106-110)/(46-56) 110/49 (11/06 0803) SpO2:  [95 %-98 %] 96 % (11/06 0803) Last BM Date: 01/28/19 General:   Alert and oriented, pleasant. Sounds tired. Head:  Normocephalic and atraumatic. Neck:  Supple, without thyromegaly or masses.  Heart:  S1, S2 present, no murmurs noted.  Lungs: Clear to auscultation bilaterally, without wheezing, rales, or rhonchi.  Abdomen:  Bowel sounds present, distended but soft, non-tender. No HSM or hernias noted. No rebound or guarding. No masses appreciated  Msk:  Symmetrical without gross deformities. Pulses:  Normal bilateral DP pulses noted. Extremities:  Without clubbing or edema. Neurologic:  Alert and  oriented x4;  grossly normal neurologically. Skin:  Warm and dry, intact without significant lesions. Appears somewhat jaundiced. Psych:  Alert and cooperative. Normal mood and affect.  Intake/Output from previous day: 11/05 0701 - 11/06 0700 In: 683 [P.O.:200; I.V.:679] Out: -  Intake/Output this shift: No intake/output data recorded.  Lab Results: Recent Labs    01/27/19 0421 01/28/19 0500 01/29/19 0516  WBC 13.7* 10.0 12.2*  HGB 8.3* 7.9* 8.7*  HCT 25.0* 24.2* 26.1*  PLT 178 146* 172   BMET Recent Labs    01/27/19 0421 01/28/19 0500  NA 131* 129*  K 4.2 4.2  CL 100 102  CO2 19* 19*  GLUCOSE 129* 129*  BUN 46* 49*  CREATININE 1.70* 1.71*  CALCIUM 8.6* 8.0*   LFT Recent Labs    01/27/19 0421 01/28/19 0500  PROT 5.5* 5.3*  ALBUMIN 2.2* 2.3*  AST 48* 39   ALT 42 33  ALKPHOS 117 102  BILITOT 3.8* 3.1*   PT/INR No results for input(s): LABPROT, INR in the last 72 hours. Hepatitis Panel No results for input(s): HEPBSAG, HCVAB, HEPAIGM, HEPBIGM in the last 72 hours.   Studies/Results: US Paracentesis  Result Date: 01/27/2019 INDICATION: Ascites EXAM: ULTRASOUND GUIDED THERAPEUTIC PARACENTESIS MEDICATIONS: None COMPLICATIONS: None immediate PROCEDURE: Informed written consent was obtained from the patient after a discussion of the risks, benefits and alternatives to treatment. A timeout was performed prior to the initiation of the procedure. Initial ultrasound scanning demonstrates a large amount of ascites within the right lower abdominal quadrant. The right lower abdomen was prepped and draped in the usual sterile fashion. 1% lidocaine was used for local anesthesia. Following this, a 5 [place] Yueh catheter was introduced. An ultrasound image was saved for documentation purposes. The paracentesis was performed. The catheter was removed and a dressing was applied. The patient tolerated the procedure well without immediate post procedural complication. Patient received post-procedure intravenous albumin; see nursing notes for details. FINDINGS: A total of approximately 8.1 L of yellow fluid was removed. IMPRESSION: Successful ultrasound-guided paracentesis yielding 8.1 liters of peritoneal fluid. Electronically Signed   By: Lavonia Dana M.D.   On: 01/27/2019 16:58    Assessment: Pleasant frail-appearing 73 year old female with cirrhosis suspected to be due to [name], endometrial cancer status post hysterectomy in February 2020, who developed rather quick decompensation of her liver disease over the past several months.  Over the past couple  of months she has become weaker, decreased oral intake, and has had increased frequency of abdominal paracenteses.  Current MELD Na of 30 (27 to 32% 90-day mortality).  Patient is not a liver transplant candidate due to  age and recent cancer history.  Patient and family have met with palliative medicine team.  Patient and daughters are requesting permanent Pleurx drain.  They endorse difficulty getting to and from paracenteses on a frequent basis that the patient is requiring.  They are aware of risk of infection, sepsis, hepatorenal syndrome.  Family also inquiring about surveillance with hospice of [facility].  Appears to be fatigued, wants to take a nap. We briefly discussed PleurX procedure today and she sates she's anxious. However she admits that it's been difficult getting repeated paracentesis and this will help. Also discussed her rapid decompensation and quality versus quantity of life. She is somewhat familiar with hospital as her husband passed in September with hospice care, states "I hate that for the girls (daughters)"  Today she appears about the same. It seems her disease is taking a toll on her with increased fatigue and weakness/frailty. I feel that, unfortunately, overall she is nearing the end of her disease process.  Plan: 1. PleurX today at IR [facility] 2. Supportive measures 3. Agree with hospice surveillance 4. Will follow as an outpatient upon discharge   Thank you for allowing [place] to participate in the care of Jordan Evener, DNP, AGNP-C Adult & Gerontological Nurse Practitioner [facility]     LOS: 3 days    01/29/2019, 1:15 PM

## 2019-01-30 LAB — CBC WITH DIFFERENTIAL/PLATELET
Abs Immature Granulocytes: 0.11 10*3/uL — ABNORMAL HIGH (ref 0.00–0.07)
Basophils Absolute: 0.1 10*3/uL (ref 0.0–0.1)
Basophils Relative: 1 %
Eosinophils Absolute: 1 10*3/uL — ABNORMAL HIGH (ref 0.0–0.5)
Eosinophils Relative: 8 %
HCT: 27 % — ABNORMAL LOW (ref 36.0–46.0)
Hemoglobin: 9.1 g/dL — ABNORMAL LOW (ref 12.0–15.0)
Immature Granulocytes: 1 %
Lymphocytes Relative: 8 %
Lymphs Abs: 1 10*3/uL (ref 0.7–4.0)
MCH: 31.8 pg (ref 26.0–34.0)
MCHC: 33.7 g/dL (ref 30.0–36.0)
MCV: 94.4 fL (ref 80.0–100.0)
Monocytes Absolute: 2.1 10*3/uL — ABNORMAL HIGH (ref 0.1–1.0)
Monocytes Relative: 17 %
Neutro Abs: 8 10*3/uL — ABNORMAL HIGH (ref 1.7–7.7)
Neutrophils Relative %: 65 %
Platelets: 177 10*3/uL (ref 150–400)
RBC: 2.86 MIL/uL — ABNORMAL LOW (ref 3.87–5.11)
RDW: 15.8 % — ABNORMAL HIGH (ref 11.5–15.5)
WBC: 12.4 10*3/uL — ABNORMAL HIGH (ref 4.0–10.5)
nRBC: 0 % (ref 0.0–0.2)

## 2019-01-30 LAB — COMPREHENSIVE METABOLIC PANEL
ALT: 42 U/L (ref 0–44)
AST: 64 U/L — ABNORMAL HIGH (ref 15–41)
Albumin: 2.2 g/dL — ABNORMAL LOW (ref 3.5–5.0)
Alkaline Phosphatase: 117 U/L (ref 38–126)
Anion gap: 10 (ref 5–15)
BUN: 56 mg/dL — ABNORMAL HIGH (ref 8–23)
CO2: 19 mmol/L — ABNORMAL LOW (ref 22–32)
Calcium: 8.5 mg/dL — ABNORMAL LOW (ref 8.9–10.3)
Chloride: 100 mmol/L (ref 98–111)
Creatinine, Ser: 1.7 mg/dL — ABNORMAL HIGH (ref 0.44–1.00)
GFR calc Af Amer: 34 mL/min — ABNORMAL LOW (ref >60)
GFR calc non Af Amer: 29 mL/min — ABNORMAL LOW (ref >60)
Glucose, Bld: 127 mg/dL — ABNORMAL HIGH (ref 70–99)
Potassium: 3.9 mmol/L (ref 3.5–5.1)
Sodium: 129 mmol/L — ABNORMAL LOW (ref 135–145)
Total Bilirubin: 3.3 mg/dL — ABNORMAL HIGH (ref 0.3–1.2)
Total Protein: 5.5 g/dL — ABNORMAL LOW (ref 6.5–8.1)

## 2019-01-30 MED ORDER — SPIRONOLACTONE 25 MG PO TABS: 12.5000 mg | ORAL_TABLET | Freq: Every day | ORAL | 3 refills | Status: AC

## 2019-01-30 MED ORDER — MIRTAZAPINE 15 MG PO TABS: 7.5000 mg | ORAL_TABLET | Freq: Every day | ORAL | 2 refills | Status: AC

## 2019-01-30 MED ORDER — LACTULOSE 10 GM/15ML PO SOLN: 20.0000 g | Freq: Two times a day (BID) | ORAL | 1 refills | Status: AC

## 2019-01-30 MED ORDER — ONDANSETRON 4 MG PO TBDP: 4.0000 mg | ORAL_TABLET | Freq: Three times a day (TID) | ORAL | 0 refills | Status: AC | PRN

## 2019-01-30 MED ORDER — OMEPRAZOLE 20 MG PO CPDR: 20.0000 mg | DELAYED_RELEASE_CAPSULE | Freq: Every day | ORAL | 2 refills | Status: AC

## 2019-01-30 MED ORDER — DILTIAZEM HCL 30 MG PO TABS: 30.0000 mg | ORAL_TABLET | Freq: Two times a day (BID) | ORAL | 2 refills | Status: AC | PRN

## 2019-01-30 MED ORDER — TORSEMIDE 10 MG PO TABS: 10.0000 mg | ORAL_TABLET | Freq: Every day | ORAL | 3 refills | Status: AC

## 2019-01-30 NOTE — Discharge Instructions (Signed)
1)Avoid ibuprofen/Advil/Aleve/Motrin/Goody Powders/Naproxen/BC powders/Meloxicam/Diclofenac/Indomethacin and other Nonsteroidal anti-inflammatory medications as these will make you more likely to bleed and can cause stomach ulcers, can also cause Kidney problems.  2)Hospice RN to help manage right-sided lower abdominal Pleurx catheter--- peritoneal fluid will have to be drained about every 4 to 5 days per protocol  3)Please take Lactulose as prescribed to achieve 2 to 3 soft/mushy/loose bowel movements each day-in order to avoid confusion and encephalopathy  4)Please take Zofran as needed for nausea  5)Please take Remeron 7.5 mg every bedtime for mood and appetite stimulation

## 2019-01-30 NOTE — Progress Notes (Signed)
IV removed, 2x2 gauze and paper tape applied to site, patient tolerated well. Reviewed AVS with patient and patient's daughter, Shirley Friar, both verbalized understanding.  Patient taken to lobby via wheelchair and transported home by her daughter, Shirley Friar.

## 2019-01-30 NOTE — Discharge Summary (Signed)
Jordan Le, is a 73 y.o. female  DOB [birth year]  MRN [id number].  Admission date:  01/25/2019  Admitting Physician  Reubin Milan, MD  Discharge Date:  01/30/2019   Primary MD  Manon Hilding, MD  Recommendations for primary care physician for things to follow:   1)Avoid ibuprofen/Advil/Aleve/Motrin/Goody Powders/Naproxen/BC powders/Meloxicam/Diclofenac/Indomethacin and other Nonsteroidal anti-inflammatory medications as these will make you more likely to bleed and can cause stomach ulcers, can also cause Kidney problems.  2)Hospice RN to help manage right-sided lower abdominal Pleurx catheter--- peritoneal fluid will have to be drained about every 4 to 5 days per protocol  3)Please take Lactulose as prescribed to achieve 2 to 3 soft/mushy/loose bowel movements each day-in order to avoid confusion and encephalopathy  4)Please take Zofran as needed for nausea  5)Please take Remeron 7.5 mg every bedtime for mood and appetite stimulation  Admission Diagnosis  Hyponatremia [E87.1] Acute encephalopathy [G93.40] Acute kidney injury ([place]) [N17.9] Chronic liver failure without hepatic coma (HCC) [K72.10]   Discharge Diagnosis  Hyponatremia [E87.1] Acute encephalopathy [G93.40] Acute kidney injury ([place]) [N17.9] Chronic liver failure without hepatic coma (HCC) [K72.10]    Principal Problem:   AKI (acute kidney injury) ([place]) Active Problems:   Essential hypertension   Acute hepatic encephalopathy   Hepatic encephalopathy (HCC)   Type 2 diabetes mellitus ([place])   Hyponatremia   Anemia   Hypothyroidism   Chronic liver failure without hepatic coma ([place])   Goals of care, counseling/discussion   Palliative care by specialist   DNR (do not resuscitate) discussion   Encounter for hospice care discussion      Past Medical History:  Diagnosis Date   Arthritis    Cirrhosis of liver ([place])     Diabetes mellitus without complication ([place])    type 2   Endometrial cancer ([place]) 05/07/2018   High cholesterol    Hypertension    Mini stroke ([place])    2011   Pneumonia    Renal disorder     Past Surgical History:  Procedure Laterality Date   ABDOMINAL HYSTERECTOMY     BIOPSY  11/23/2018   Procedure: BIOPSY;  Surgeon: Danie Binder, MD;  [facility];  Service: Endoscopy;;   BREAST BIOPSY Right    ~15 years ago-2005   ESOPHAGOGASTRODUODENOSCOPY N/A 11/23/2018   Dr. Oneida Alar: MILD Portal hypertensive gastropathy.  Moderate gastritis/mild duodenitis due to aspirin.  Next surveillance EGD in 2 to 3 years.   IR PERC TUN PERIT CATH WO PORT S&I /IMAG  01/29/2019   OTHER SURGICAL HISTORY  2009   Uterine Polyp removal   polyps removed from the uterus     ROBOTIC ASSISTED TOTAL HYSTERECTOMY WITH BILATERAL SALPINGO OOPHERECTOMY Bilateral 05/07/2018   Procedure: XI ROBOTIC ASSISTED TOTAL HYSTERECTOMY WITH BILATERAL SALPINGO OOPHORECTOMY PELVIC LYMPHANECTOMY;  Surgeon: Everitt Amber, MD;  Location: [name];  Service: Gynecology;  Laterality: Bilateral;   SENTINEL NODE BIOPSY Bilateral 05/07/2018   Procedure: SENTINEL NODE BIOPSY;  Surgeon: Everitt Amber, MD;  Location: [name];  Service: Gynecology;  Laterality: Bilateral;     HPI  from the history and physical done on the day of admission:   - Chief Complaint: Abdominal distention, weakness and nausea.  HPI: Jordan Le is a 73 y.o. female with medical history significant of osteoarthritis, type 2 diabetes, endometrial cancer, hyperlipidemia, hypertension, history of TIA, history of pneumonia, renal disorder, liver cirrhosis who is coming to the emergency department due to abdominal distention, weakness and nausea since this morning.  She denies melena, hematochezia, emesis or diarrhea and states that her last BM was 2 days ago.  She is only taking lactulose as needed.  No headache, fever, chills, but feels fatigue with  decreased appetite.  Denies rhinorrhea, sore throat, wheezing, dyspnea, hemoptysis.  No chest pain, palpitations, diaphoresis, PND, but occasionally gets some orthopnea and pitting edema lower extremities.  She denies dysuria, frequency or hematuria.  She denies polyuria, polydipsia, polyphagia or blurred vision.  ED Course: Initial vital signs temperature 98.3 F, pulse 98, respiration 18, blood pressure 118/50 mmHg and O2 sat 98% on room air.  The patient was given 250 mL of NS.  Urinalysis was unremarkable.  Her CBC showed a white count was 11.9, hemoglobin 9.7 g/dL and platelets 219.  CMP shows a sodium 125, potassium 5.1, chloride 92 and CO2 20 mmol/L.  BUN was 43, creatinine 1.94, glucose 182 and calcium 8.5 mg/dL.  Total protein 6.0, albumin 2.4 g/dL.  AST 76, ALT 55 and alkaline phosphatase 135 units/L.  Total bilirubin was 3.4 mg/dL.  Her ammonia level was 131 mol/L.  Review of Systems: As per HPI otherwise 10 point review of systems negative.      Hospital Course:   -Brief History: 73 year old female with  DM2, endometrial cancer (status post hysterectomy February 202)  HTN/HLD, history of TIA, CKD III, NASH liver cirrhosis admitted on 11/2/2020with  hyponatremia, hepatic encephalopathy and AKI on CKD III -on 01/29/2019--Tunneled peritoneal PleurX catheter placed from right abdominal approach. 2.5 L ascites removed.   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 2.5 L ascites removed.  2)AKI----acute kidney injury on CKD stage - III-suspect due to decreased oral intake in the setting of increased lethargy compounded by some component of hepatorenal syndrome -   creatinine on admission=  1.94,   baseline creatinine =1.2 to 1.3    , creatinine is now= 1.70 , renally adjust medications, avoid nephrotoxic agents / dehydration /hypotension -Renal function currently stable  3)H/o SVT/Atrial tachycardia -Echo from 11/12/2018 with EF over 65%,- -TSH 5.4 -Consider propranolol or atenolol if tachycardia persist  4)H/o DM2  -10/26/18 Hemoglobin A1c--4.7--putting her at risk for hypoglycemia --Discontinued metformin - 5)Hypothyroidism -TSH is 5.4 -Continue Synthroid  6)H/o Endometrial Carcinoma -Status post hysterectomy 05/07/2018 -follow up Dr. Denman George  7)Social/Ethics- family conference with Palliative care and pt's 2 daughters----  Discussed with daughters x 2 (Ms Shirley Friar- [phone]) and Nancie Neas--- [phone]) Pt is a DNR,  Palliative care consult appreciated Had Pleurx peritoneal catheter placed to avoid repeated trips to the  hospital for paracentesis -Discharge home with hospice  8)Anemia of chronic disease --- in the setting of liver cirrhosis --Hgb currently 9.1, continue to monitor and transfuse as clinically indicated  Family Communication:Discussed with daughters x 2 (Ms Shirley Friar- [phone]) and Nancie Neas--- [phone])  Consultants:Palliative care/IR/GI  Code Status: DNR  DVT Prophylaxis: SCD  Disposition-discharge home with hospice services   Recent Labs       Lab Results  Component Value Date   PLT 172 01/29/2019     Procedure- -Had paracentesis on 01/27/2019 with removal of 8.1 L of fluid - on 01/29/2019--Tunneled peritoneal PleurX catheter placed from right  abdominal approach. 2.5 L ascites removed  Discharge Condition: Poor prognosis overall  Follow UP--hospice team  Diet and Activity recommendation:  As advised  Discharge Instructions    Discharge Instructions    Call MD for:  difficulty breathing, headache or visual disturbances   Complete by: As directed    Call MD for:  persistant dizziness or light-headedness   Complete by: As directed    Call MD for:  persistant nausea and vomiting   Complete by: As directed    Call MD for:  temperature >100.4   Complete by: As directed    Diet - low sodium heart healthy   Complete by: As directed    Discharge instructions   Complete by: As directed    1)Avoid ibuprofen/Advil/Aleve/Motrin/Goody Powders/Naproxen/BC powders/Meloxicam/Diclofenac/Indomethacin and other Nonsteroidal anti-inflammatory medications as these will make you more likely to bleed and can cause stomach ulcers, can also cause Kidney problems.  2)Hospice RN to help manage right-sided lower abdominal Pleurx catheter--- peritoneal fluid will have to be drained about every 4 to 5 days per protocol  3)Please take Lactulose as prescribed to achieve 2 to 3 soft/mushy/loose bowel movements each day-in order to avoid confusion and encephalopathy  4)Please take Zofran as needed for nausea  5)Please take Remeron 7.5 mg every bedtime for mood and appetite stimulation   Increase activity slowly   Complete by: As directed        Discharge Medications     Allergies as of 01/30/2019   No Known Allergies     Medication List    STOP taking these medications   atorvastatin 10 MG tablet Commonly known as: LIPITOR   diltiazem 120 MG 24 hr capsule Commonly known as: Cardizem CD   docusate sodium 100 MG capsule Commonly known as: COLACE   levOCARNitine (Dietary) 330 MG Tabs   nystatin powder Commonly known as: MYCOSTATIN/NYSTOP   rifaximin 550 MG Tabs tablet Commonly known as: XIFAXAN   sennosides-docusate sodium 8.6-50  MG tablet Commonly known as: SENOKOT-S     TAKE these medications   diltiazem 30 MG tablet Commonly known as: Cardizem Take 1 tablet (30 mg total) by mouth every 12 (twelve) hours as needed. For HR > 120 bpm   feeding supplement (ENSURE ENLIVE) Liqd Take 237 mLs by mouth 2 (two) times daily between meals. What changed: when to take this   lactulose 10 GM/15ML solution Commonly known as: CHRONULAC Take 30 mLs (20 g total) by mouth 2 (two) times daily. What changed: when to take this   levothyroxine 75 MCG tablet Commonly known as: SYNTHROID Take 75 mcg by mouth daily before breakfast.   mirtazapine 15 MG tablet Commonly known as: Remeron Take 0.5 tablets (7.5 mg total) by mouth at bedtime. For mood and appetite stimulation   multivitamin with minerals Tabs tablet Take 1 tablet by mouth daily.   omeprazole  20 MG capsule Commonly known as: PRILOSEC Take 1 capsule (20 mg total) by mouth daily before breakfast. 1 PO 30 MINS PRIOR TO BREAKFAST.   ondansetron 4 MG disintegrating tablet Commonly known as: Zofran ODT Take 1 tablet (4 mg total) by mouth every 8 (eight) hours as needed for nausea or vomiting.   Potassium 99 MG Tabs Take 1 tablet by mouth daily.   spironolactone 25 MG tablet Commonly known as: ALDACTONE Take 0.5 tablets (12.5 mg total) by mouth daily. What changed:   medication strength  how much to take   torsemide 10 MG tablet Commonly known as: DEMADEX Take 1 tablet (10 mg total) by mouth daily. What changed:   medication strength  how much to take   zinc sulfate 220 (50 Zn) MG capsule Take 1 capsule (220 mg total) by mouth 3 (three) times daily. What changed: when to take this      Major procedures and Radiology Reports - PLEASE review detailed and final reports for all details, in brief -    US Paracentesis  Result Date: 01/27/2019 INDICATION: Ascites EXAM: ULTRASOUND GUIDED THERAPEUTIC PARACENTESIS MEDICATIONS: None COMPLICATIONS: None  immediate PROCEDURE: Informed written consent was obtained from the patient after a discussion of the risks, benefits and alternatives to treatment. A timeout was performed prior to the initiation of the procedure. Initial ultrasound scanning demonstrates a large amount of ascites within the right lower abdominal quadrant. The right lower abdomen was prepped and draped in the usual sterile fashion. 1% lidocaine was used for local anesthesia. Following this, a 5 [place] Yueh catheter was introduced. An ultrasound image was saved for documentation purposes. The paracentesis was performed. The catheter was removed and a dressing was applied. The patient tolerated the procedure well without immediate post procedural complication. Patient received post-procedure intravenous albumin; see nursing notes for details. FINDINGS: A total of approximately 8.1 L of yellow fluid was removed. IMPRESSION: Successful ultrasound-guided paracentesis yielding 8.1 liters of peritoneal fluid. Electronically Signed   By: Lavonia Dana M.D.   On: 01/27/2019 16:58   US Paracentesis  Result Date: 01/13/2019 INDICATION: Cirrhosis Ascites EXAM: ULTRASOUND GUIDED RLQ PARACENTESIS MEDICATIONS: 10 cc 1% lidocaine COMPLICATIONS: None immediate. PROCEDURE: Informed written consent was obtained from the patient after a discussion of the risks, benefits and alternatives to treatment. A timeout was performed prior to the initiation of the procedure. Initial ultrasound scanning demonstrates a large amount of ascites within the right lower abdominal quadrant. The right lower abdomen was prepped and draped in the usual sterile fashion. 1% lidocaine was used for local anesthesia. Following this, a 7 cm Yeuh catheter was introduced. An ultrasound image was saved for documentation purposes. The paracentesis was performed. The catheter was removed and a dressing was applied. The patient tolerated the procedure well without immediate post procedural  complication. Patient received post-procedure intravenous albumin; see nursing notes for details. FINDINGS: A total of approximately 6.2 Liters of reddish fluid was removed. Samples were sent to the laboratory as requested by the clinical team. IMPRESSION: Successful ultrasound-guided paracentesis yielding 6.2 liters of peritoneal fluid. Read by Lavonia Drafts [facility] Electronically Signed   By: Lavonia Dana M.D.   On: 01/13/2019 10:14   US Paracentesis  Result Date: 01/01/2019 INDICATION: Cirrhosis, ascites EXAM: ULTRASOUND GUIDED DIAGNOSTIC AND THERAPEUTIC PARACENTESIS MEDICATIONS: None COMPLICATIONS: None immediate PROCEDURE: Informed written consent was obtained from the patient after a discussion of the risks, benefits and alternatives to treatment. A timeout was performed prior to the initiation of  the procedure. Initial ultrasound scanning demonstrates a large amount of ascites within the right lower abdominal quadrant. The right lower abdomen was prepped and draped in the usual sterile fashion. 1% lidocaine was used for local anesthesia. Following this, a 5 [place] Yueh catheter was introduced. An ultrasound image was saved for documentation purposes. The paracentesis was performed. The catheter was removed and a dressing was applied. The patient tolerated the procedure well without immediate post procedural complication. Patient received post-procedure intravenous albumin; see nursing notes for details. FINDINGS: A total of approximately 4.2 L of amber colored ascitic fluid was removed. Samples were sent to the laboratory as requested by the clinical team. IMPRESSION: Successful ultrasound-guided paracentesis yielding 4.2 liters of peritoneal fluid. Electronically Signed   By: Lavonia Dana M.D.   On: 01/01/2019 13:31   Ir Perc Athena Masse Perit Cath Wo Port  Result Date: 01/29/2019 CLINICAL DATA:  Cirrhosis, ascites and need for palliative tunneled peritoneal drainage catheter. EXAM: INSERTION OF TUNNELED  PERITONEAL DRAINAGE CATHETER ANESTHESIA/SEDATION: 0.5 mg IV Versed; 25 mcg IV Fentanyl. Total Moderate Sedation Time 17 minutes. The patient's level of consciousness and physiologic status were continuously monitored during the procedure by Radiology nursing. MEDICATIONS: 2 g IV Ancef. Antibiotic was administered in an appropriate time interval for the procedure. FLUOROSCOPY TIME:  6 seconds.  0.4 mGy. PROCEDURE: The procedure, risks, benefits, and alternatives were explained to the patient. Questions regarding the procedure were encouraged and answered. The patient understands and consents to the procedure. A time-out was performed prior to initiating the procedure. The right abdominal wall was prepped with chlorhexidine in a sterile fashion, and a sterile drape was applied covering the operative field. A sterile gown and sterile gloves were used for the procedure. Local anesthesia was provided with 1% Lidocaine. Ultrasound image documentation was performed. Fluoroscopy during the procedure and fluoroscopic spot radiograph confirms appropriate catheter position. After creating a small skin incision, a 19 gauge needle was advanced into the peritoneal cavity under ultrasound guidance. A guide wire was then advanced under fluoroscopy into the peritoneal cavity. Peritoneal access was dilated serially and a 16-French peel-away sheath placed. A 16 French tunneled peritoneal PleurX catheter was placed. This was tunneled from an incision 5 cm below the peritoneal access to the access site. The catheter was advanced through the peel-away sheath. The sheath was then removed. Final catheter positioning was confirmed with a fluoroscopic spot image. The peritoneal access incision was closed with subcuticular 4-0 Vicryl. Dermabond was applied to the incision. A Prolene retention suture was applied at the catheter exit site. Large volume paracentesis was performed through the new catheter utilizing drainage bottles.  COMPLICATIONS: None. FINDINGS: The catheter was placed via the right abdominal wall. Catheter course is across the midline into the left mid abdomen. Approximately 2.5 liters of ascites was able to be removed after catheter placement. IMPRESSION: Placement of tunneled peritoneal drainage catheter via right abdominal approach. 2.5 liters of ascites was removed today after catheter placement. Electronically Signed   By: Aletta Edouard M.D.   On: 01/29/2019 16:48    Micro Results   Recent Results (from the past 240 hour(s))  Urine Culture     Status: None   Collection Time: 01/25/19  9:12 PM   Specimen: Urine, Catheterized  Result Value Ref Range Status   Specimen Description   Final    URINE, CATHETERIZED Performed at [facility], [address], [place], [place] [address]    Special Requests   Final    NONE  Performed at [facility], [address], [place], [place] [address]    Culture   Final    NO GROWTH Performed at [facility], [place] [address], [place], [place] [address]    Report Status 01/27/2019 FINAL  Final  SARS CORONAVIRUS 2 (TAT 6-24 HRS) Nasopharyngeal Nasopharyngeal Swab     Status: None   Collection Time: 01/25/19 11:39 PM   Specimen: Nasopharyngeal Swab  Result Value Ref Range Status   SARS Coronavirus 2 NEGATIVE NEGATIVE Final    Comment: (NOTE) SARS-CoV-2 target nucleic acids are NOT DETECTED. The SARS-CoV-2 RNA is generally detectable in upper and lower respiratory specimens during the acute phase of infection. Negative results do not preclude SARS-CoV-2 infection, do not rule out co-infections with other pathogens, and should not be used as the sole basis for treatment or other patient management decisions. Negative results must be combined with clinical observations, patient history, and epidemiological information. The expected result is Negative. Fact Sheet for Patients: SugarRoll.be Fact Sheet for Healthcare  Providers: https://www.woods-mathews.com/ This test is not yet approved or cleared by the [place] FDA and  has been authorized for detection and/or diagnosis of SARS-CoV-2 by FDA under an Emergency Use Authorization (EUA). This EUA will remain  in effect (meaning this test can be used) for the duration of the COVID-19 declaration under Section 56 4(b)(1) of the Act, 21 U.S.C. section 360bbb-3(b)(1), unless the authorization is terminated or revoked sooner. Performed at [facility], [place] [address], [place], [place] [address]        Today   Subjective    Braydee Shimkus today has no new complaints -Resting comfortably -Oral intake is fair          Patient has been seen and examined prior to discharge   Objective   Blood pressure (!) 105/45, pulse 76, temperature 97.9 F (36.6 C), temperature source Oral, resp. rate 18, height 5\' 3"  (1.6 m), weight 83.5 kg, SpO2 98 %.   Intake/Output Summary (Last 24 hours) at 01/30/2019 1510 Last data filed at 01/30/2019 1300 Gross per 24 hour  Intake 811.09 ml  Output --  Net 811.09 ml    Exam Gen:- Awake Alert, no acute distress  HEENT:- [place].AT,  Neck-Supple Neck,No JVD,.  Lungs-improved air movement, no wheezing CV- S1, S2 normal, regular Abd-  +ve B.Sounds, Abd Soft, much less distended, Rt sided PleurX catheter  extremity/Skin:-    good pulses Psych-affect is appropriate, oriented x3 Neuro-generalized weakness, no new focal deficits, no tremors    Data Review   CBC w Diff:  Lab Results  Component Value Date   WBC 12.4 (H) 01/30/2019   HGB 9.1 (L) 01/30/2019   HCT 27.0 (L) 01/30/2019   PLT 177 01/30/2019   LYMPHOPCT 8 01/30/2019   MONOPCT 17 01/30/2019   EOSPCT 8 01/30/2019   BASOPCT 1 01/30/2019    CMP:  Lab Results  Component Value Date   NA 129 (L) 01/30/2019   K 3.9 01/30/2019   CL 100 01/30/2019   CO2 19 (L) 01/30/2019   BUN 56 (H) 01/30/2019   CREATININE 1.70 (H) 01/30/2019    CREATININE 1.53 (H) 11/20/2018   PROT 5.5 (L) 01/30/2019   ALBUMIN 2.2 (L) 01/30/2019   BILITOT 3.3 (H) 01/30/2019   ALKPHOS 117 01/30/2019   AST 64 (H) 01/30/2019   ALT 42 01/30/2019  .   Total Discharge time is about 33 minutes  Roxan Hockey M.D on 01/30/2019 at 3:10 PM  Go  to [email or web address] -  for contact info  [facility] - Office  [phone]

## 2019-02-02 NOTE — Telephone Encounter (Signed)
Faxed the tier exception request for the Xifaxan.

## 2019-02-22 DIAGNOSIS — E039 Hypothyroidism, unspecified: Secondary | ICD-10-CM | POA: Diagnosis not present

## 2019-02-22 DIAGNOSIS — I10 Essential (primary) hypertension: Secondary | ICD-10-CM | POA: Diagnosis not present

## 2019-02-23 DEATH — deceased

## 2019-03-04 ENCOUNTER — Ambulatory Visit: Payer: PPO | Admitting: Nurse Practitioner

## 2019-05-12 ENCOUNTER — Ambulatory Visit: Payer: PPO | Admitting: Nurse Practitioner

## 2020-12-28 IMAGING — MR MR ABDOMEN WO/W CM
10 of 18 series · 21 of 48 positions shown · IV contrast (gadavist)
Comparison: CT scan 10/25/2018

CLINICAL DATA: Cirrhosis and ascites.

EXAM:
MRI ABDOMEN WITHOUT AND WITH CONTRAST
TECHNIQUE: Multiplanar multisequence MR imaging of the abdomen was performed
both before and after the administration of intravenous contrast.
CONTRAST:  9mL GADAVIST GADOBUTROL 1 MMOL/ML IV SOLN

[Series 3: T2 · coronal · 5.0mm · 1.31mm/px · 1 of 36 slices shown (1 of 3)]
[im 1/36]
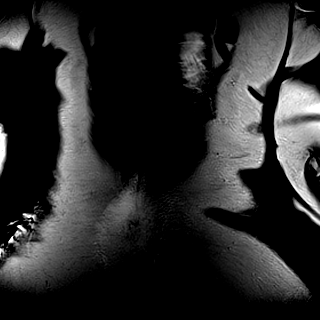

[Series 5: T2 · axial · 4.0mm · 1.16mm/px · 1 of 40 slices shown (2 of 3)]
[im 1/40]
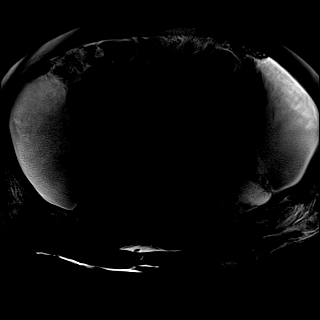

[Series 6: DWI · axial · 5.0mm · 0.99mm/px · z∈[-53,+157]mm · 3 of 72 slices shown]
[im 1/72]
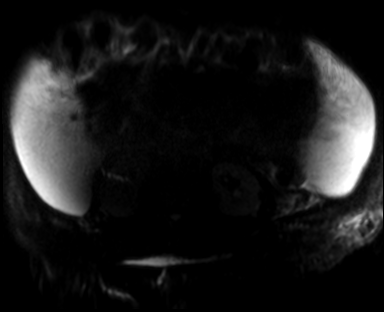
[im 36/72]
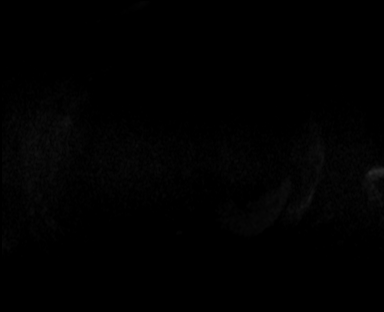
[im 72/72]
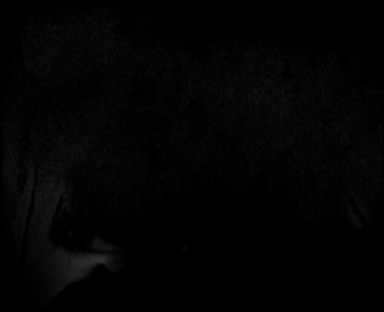

[Series 7: ax dwi_adc · axial · 5.0mm · 0.99mm/px · 1 of 36 slices shown]
[im 1/36]
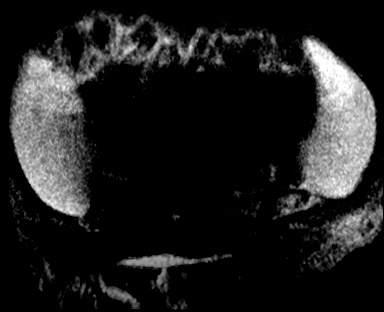

[Series 8: bSSFP · axial · 4.0mm · 0.74mm/px · z∈[-87,+189]mm · 3 of 70 slices shown]
[im 1/70]
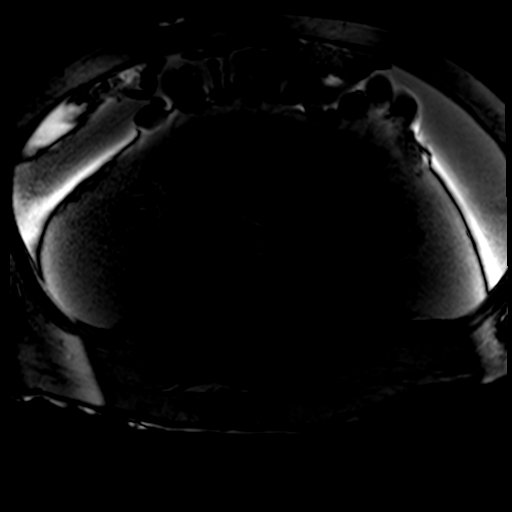
[im 35/70]
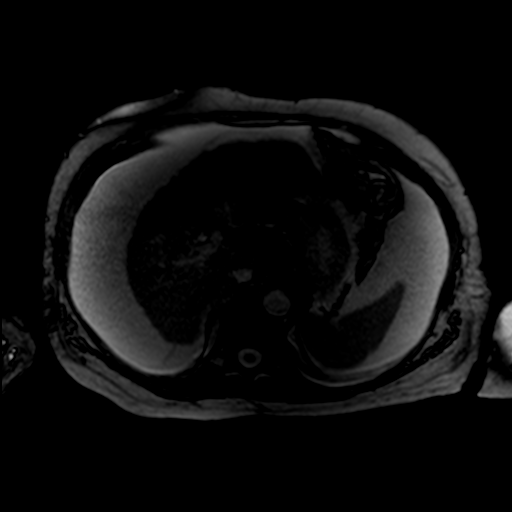
[im 70/70]
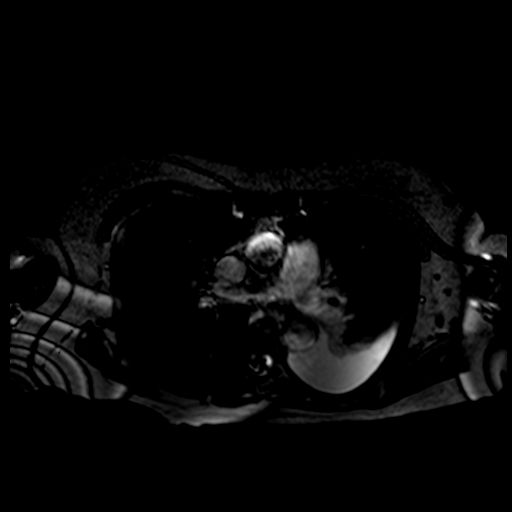

[Series 9: ax dual echo_in · axial · 4.0mm · 0.62mm/px · z∈[-97,+155]mm · 3 of 64 slices shown]
[im 1/64]
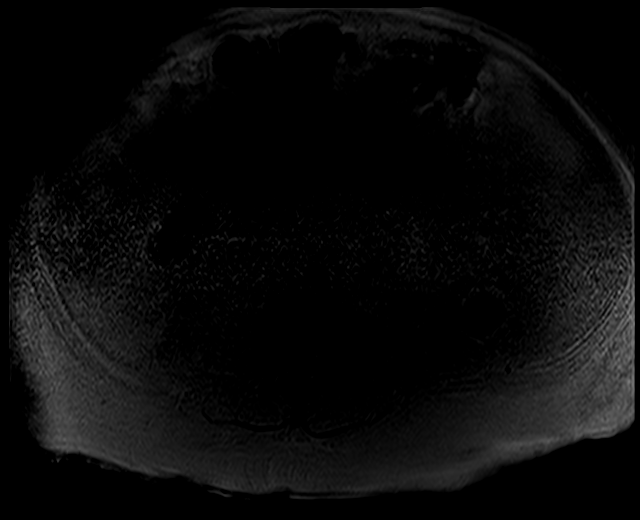
[im 32/64]
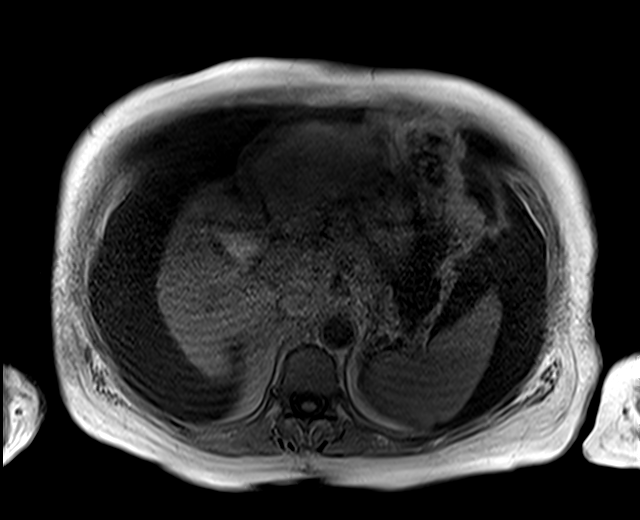
[im 64/64]
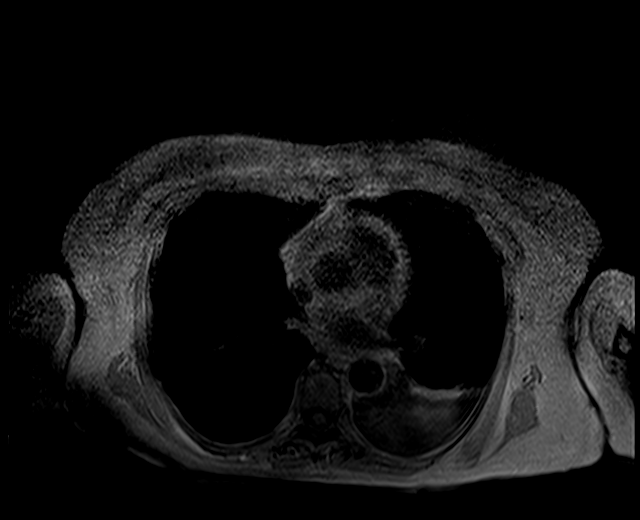

[Series 10: ax dual echo_opp · axial · 4.0mm · 0.62mm/px · z∈[-97,+155]mm · 3 of 64 slices shown]
[im 1/64]
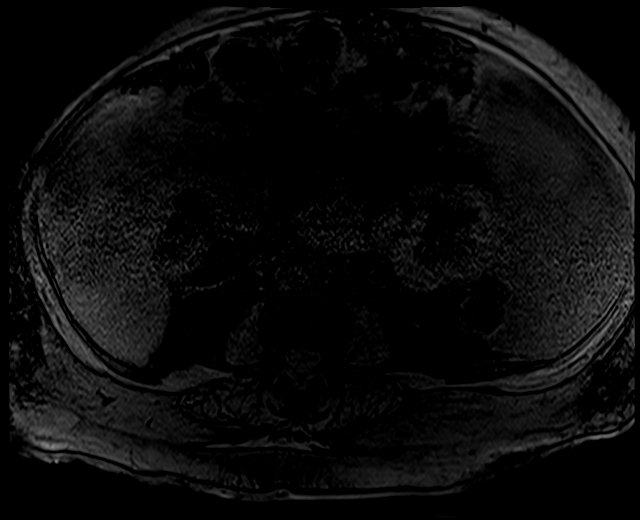
[im 32/64]
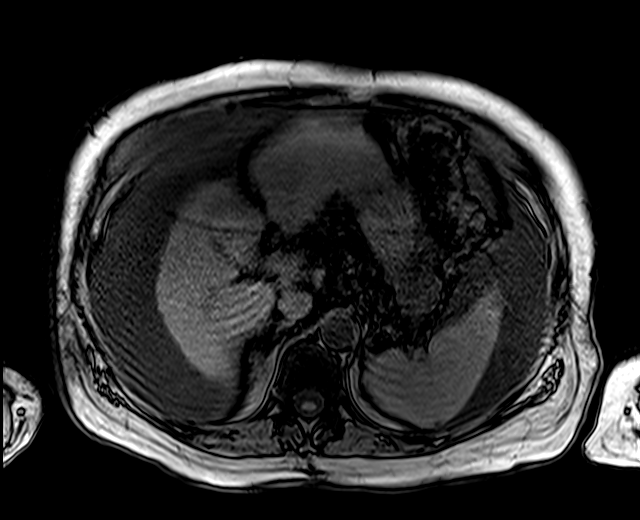
[im 64/64]
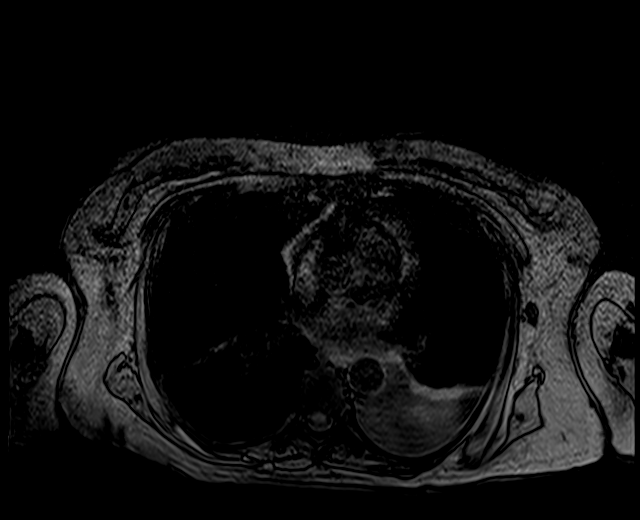

[Series 22: T2 · axial · 5.0mm · 1.48mm/px · z∈[-66,+168]mm · 2 of 40 slices shown (3 of 3)]
[im 1/40]
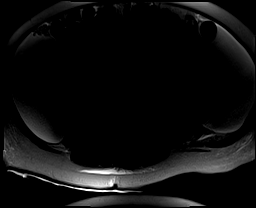
[im 40/40]
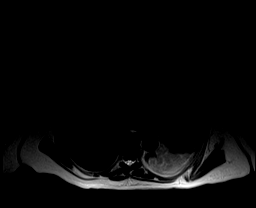

[Series 5004: pre · axial · non-contrast · 3.5mm · 0.62mm/px · z∈[-96,+153]mm · 3 of 72 slices shown]
[im 1/72]
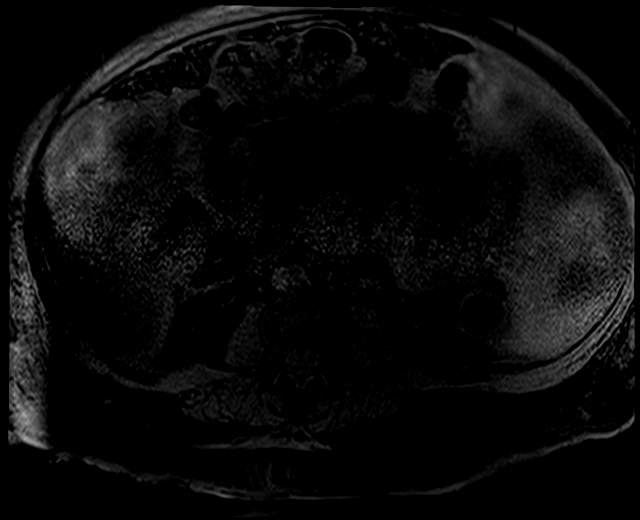
[im 36/72]
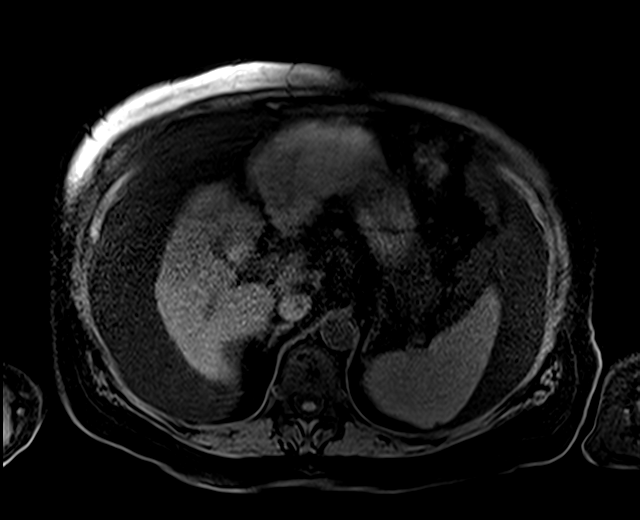
[im 72/72]
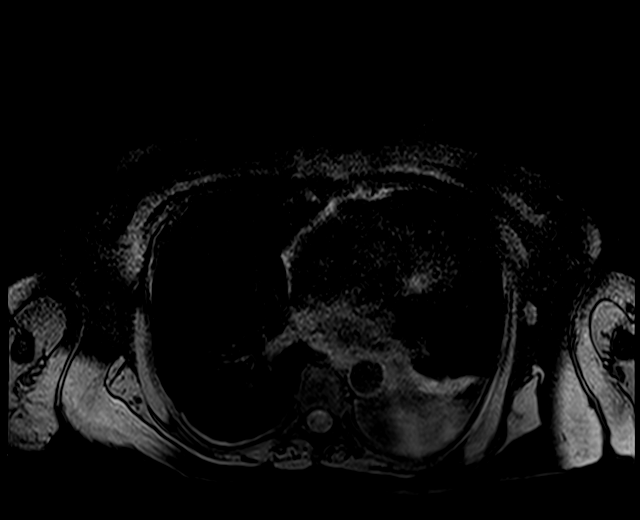

[Series 5005: 23 sec post · axial · 3.5mm · 0.62mm/px · 1 of 72 slices shown]
[im 1/72]
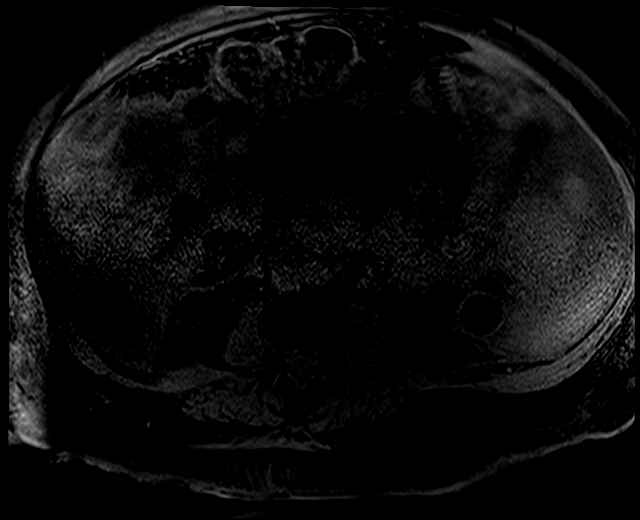

[21 of 48 positions shown; findings below may reference images not displayed]

FINDINGS: Lower chest: There is a small left pleural effusion and overlying
left lower lobe atelectasis. No pericardial effusion.

Hepatobiliary: Advanced cirrhotic changes involving the liver. The
liver is small and demonstrates marked cortical irregularity with
dilated hepatic fissures and increased caudate to right lobe ratio.
There is portal venous hypertension with portal venous collaterals
and paraesophageal varices. The portal and hepatic veins are patent.

No worrisome early arterial phase enhancing lesions to suggest
hepatoma or dysplastic nodules.

The gallbladder is mildly distended. No gallstones are identified.
No common bile duct dilatation.

Pancreas: No mass, inflammation or ductal dilatation. Moderate
pancreatic atrophy.

Spleen:  Upper limits of normal in size.  No focal lesions.

Adrenals/Urinary Tract: The adrenal glands and kidneys are
unremarkable.

Stomach/Bowel: Visualized portions within the abdomen are
unremarkable.

Vascular/Lymphatic: The aorta and branch vessels are patent. The
major venous structures are patent. Small scattered mesenteric and
retroperitoneal lymph nodes but no mass or overt adenopathy.

Other: Large volume abdominal ascites. No obvious omental or
peritoneal surface lesions.

Musculoskeletal: No significant bony findings.
IMPRESSION: 1. Advanced cirrhotic changes involving the liver as detailed above.
There is portal venous hypertension, portal venous collaterals and
paraesophageal varices.
2. No worrisome early arterial phase enhancing liver lesions to
suggest hepatoma or dysplastic nodules.
3. Large volume abdominal ascites.
4. Left pleural effusion with overlying atelectasis.
